# Patient Record
Sex: Female | Born: 1960
Health system: Southern US, Community
[De-identification: ages and names within clinical notes are randomized; demographics above are authoritative.]

## PROBLEM LIST (undated history)

## (undated) DIAGNOSIS — R011 Cardiac murmur, unspecified: Secondary | ICD-10-CM

## (undated) DIAGNOSIS — D219 Benign neoplasm of connective and other soft tissue, unspecified: Secondary | ICD-10-CM

## (undated) DIAGNOSIS — I341 Nonrheumatic mitral (valve) prolapse: Secondary | ICD-10-CM

## (undated) DIAGNOSIS — M8580 Other specified disorders of bone density and structure, unspecified site: Secondary | ICD-10-CM

## (undated) DIAGNOSIS — N809 Endometriosis, unspecified: Secondary | ICD-10-CM

## (undated) DIAGNOSIS — R51 Headache: Secondary | ICD-10-CM

## (undated) DIAGNOSIS — E785 Hyperlipidemia, unspecified: Secondary | ICD-10-CM

## (undated) HISTORY — DX: Benign neoplasm of connective and other soft tissue, unspecified: D21.9

## (undated) HISTORY — DX: Nonrheumatic mitral (valve) prolapse: I34.1

## (undated) HISTORY — PX: ROTATOR CUFF REPAIR: SHX139

## (undated) HISTORY — PX: GANGLION CYST EXCISION: SHX1691

## (undated) HISTORY — DX: Hyperlipidemia, unspecified: E78.5

## (undated) HISTORY — PX: CARPAL TUNNEL RELEASE: SHX101

## (undated) HISTORY — PX: TUBAL LIGATION: SHX77

## (undated) HISTORY — PX: OVARIAN CYST REMOVAL: SHX89

## (undated) HISTORY — PX: CHOLECYSTECTOMY: SHX55

## (undated) HISTORY — PX: VAGINAL HYSTERECTOMY: SUR661

## (undated) HISTORY — DX: Endometriosis, unspecified: N80.9

## (undated) HISTORY — DX: Cardiac murmur, unspecified: R01.1

---
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
## 1995-06-29 HISTORY — PX: OTHER SURGICAL HISTORY: SHX169

## 1997-08-14 ENCOUNTER — Ambulatory Visit (HOSPITAL_COMMUNITY): Admission: RE | Admit: 1997-08-14 | Discharge: 1997-08-14 | Payer: Self-pay | Admitting: Obstetrics and Gynecology

## 1999-05-04 ENCOUNTER — Inpatient Hospital Stay (HOSPITAL_COMMUNITY): Admission: RE | Admit: 1999-05-04 | Discharge: 1999-05-06 | Payer: Self-pay | Admitting: Obstetrics and Gynecology

## 1999-05-04 ENCOUNTER — Encounter (INDEPENDENT_AMBULATORY_CARE_PROVIDER_SITE_OTHER): Payer: Self-pay

## 1999-07-20 ENCOUNTER — Encounter: Payer: Self-pay | Admitting: Obstetrics and Gynecology

## 1999-07-20 ENCOUNTER — Ambulatory Visit (HOSPITAL_COMMUNITY): Admission: RE | Admit: 1999-07-20 | Discharge: 1999-07-20 | Payer: Self-pay | Admitting: Obstetrics and Gynecology

## 2000-04-14 ENCOUNTER — Other Ambulatory Visit: Admission: RE | Admit: 2000-04-14 | Discharge: 2000-04-14 | Payer: Self-pay | Admitting: Obstetrics and Gynecology

## 2001-01-17 ENCOUNTER — Encounter: Admission: RE | Admit: 2001-01-17 | Discharge: 2001-01-17 | Payer: Self-pay | Admitting: General Surgery

## 2001-01-17 ENCOUNTER — Encounter (HOSPITAL_BASED_OUTPATIENT_CLINIC_OR_DEPARTMENT_OTHER): Payer: Self-pay | Admitting: General Surgery

## 2001-08-29 ENCOUNTER — Other Ambulatory Visit: Admission: RE | Admit: 2001-08-29 | Discharge: 2001-08-29 | Payer: Self-pay | Admitting: Obstetrics and Gynecology

## 2001-09-04 ENCOUNTER — Encounter: Payer: Self-pay | Admitting: Obstetrics and Gynecology

## 2001-09-04 ENCOUNTER — Encounter: Admission: RE | Admit: 2001-09-04 | Discharge: 2001-09-04 | Payer: Self-pay | Admitting: Obstetrics and Gynecology

## 2002-01-22 ENCOUNTER — Encounter: Admission: RE | Admit: 2002-01-22 | Discharge: 2002-01-22 | Payer: Self-pay | Admitting: Internal Medicine

## 2002-01-22 ENCOUNTER — Encounter: Payer: Self-pay | Admitting: Internal Medicine

## 2003-01-18 ENCOUNTER — Encounter: Payer: Self-pay | Admitting: *Deleted

## 2003-01-18 ENCOUNTER — Emergency Department (HOSPITAL_COMMUNITY): Admission: EM | Admit: 2003-01-18 | Discharge: 2003-01-18 | Payer: Self-pay | Admitting: Emergency Medicine

## 2003-01-22 ENCOUNTER — Encounter: Payer: Self-pay | Admitting: Family Medicine

## 2003-01-22 ENCOUNTER — Encounter: Admission: RE | Admit: 2003-01-22 | Discharge: 2003-01-22 | Payer: Self-pay | Admitting: Family Medicine

## 2003-04-05 ENCOUNTER — Other Ambulatory Visit: Admission: RE | Admit: 2003-04-05 | Discharge: 2003-04-05 | Payer: Self-pay | Admitting: Obstetrics and Gynecology

## 2004-04-28 ENCOUNTER — Encounter: Admission: RE | Admit: 2004-04-28 | Discharge: 2004-04-28 | Payer: Self-pay | Admitting: Obstetrics and Gynecology

## 2004-05-05 ENCOUNTER — Other Ambulatory Visit: Admission: RE | Admit: 2004-05-05 | Discharge: 2004-05-05 | Payer: Self-pay | Admitting: Obstetrics and Gynecology

## 2004-05-05 ENCOUNTER — Encounter: Admission: RE | Admit: 2004-05-05 | Discharge: 2004-05-05 | Payer: Self-pay | Admitting: Obstetrics and Gynecology

## 2004-07-27 ENCOUNTER — Emergency Department (HOSPITAL_COMMUNITY): Admission: EM | Admit: 2004-07-27 | Discharge: 2004-07-27 | Payer: Self-pay | Admitting: Emergency Medicine

## 2005-03-15 ENCOUNTER — Ambulatory Visit: Payer: Self-pay | Admitting: Internal Medicine

## 2005-05-06 ENCOUNTER — Other Ambulatory Visit: Admission: RE | Admit: 2005-05-06 | Discharge: 2005-05-06 | Payer: Self-pay | Admitting: Obstetrics and Gynecology

## 2005-05-31 ENCOUNTER — Encounter: Admission: RE | Admit: 2005-05-31 | Discharge: 2005-05-31 | Payer: Self-pay | Admitting: Obstetrics and Gynecology

## 2006-04-07 ENCOUNTER — Ambulatory Visit: Payer: Self-pay | Admitting: Internal Medicine

## 2006-04-19 ENCOUNTER — Ambulatory Visit: Payer: Self-pay | Admitting: Family Medicine

## 2006-05-13 ENCOUNTER — Other Ambulatory Visit: Admission: RE | Admit: 2006-05-13 | Discharge: 2006-05-13 | Payer: Self-pay | Admitting: Obstetrics and Gynecology

## 2007-04-10 ENCOUNTER — Ambulatory Visit: Payer: Self-pay | Admitting: Internal Medicine

## 2007-04-10 DIAGNOSIS — E782 Mixed hyperlipidemia: Secondary | ICD-10-CM | POA: Insufficient documentation

## 2007-04-10 DIAGNOSIS — K219 Gastro-esophageal reflux disease without esophagitis: Secondary | ICD-10-CM | POA: Insufficient documentation

## 2007-04-16 LAB — CONVERTED CEMR LAB
ALT: 19 units/L (ref 0–35)
AST: 25 units/L (ref 0–37)
Albumin: 4.1 g/dL (ref 3.5–5.2)
Alkaline Phosphatase: 75 units/L (ref 39–117)
BUN: 15 mg/dL (ref 6–23)
Basophils Absolute: 0 10*3/uL (ref 0.0–0.1)
Basophils Relative: 0.5 % (ref 0.0–1.0)
Bilirubin, Direct: 0.1 mg/dL (ref 0.0–0.3)
CO2: 28 meq/L (ref 19–32)
Calcium: 9.4 mg/dL (ref 8.4–10.5)
Chloride: 107 meq/L (ref 96–112)
Cholesterol: 245 mg/dL (ref 0–200)
Creatinine, Ser: 0.7 mg/dL (ref 0.4–1.2)
Direct LDL: 172.5 mg/dL
Eosinophils Absolute: 0.1 10*3/uL (ref 0.0–0.6)
Eosinophils Relative: 2.5 % (ref 0.0–5.0)
GFR calc Af Amer: 115 mL/min
GFR calc non Af Amer: 95 mL/min
Glucose, Bld: 76 mg/dL (ref 70–99)
HCT: 38.6 % (ref 36.0–46.0)
HDL: 58.5 mg/dL (ref >39.0)
Hemoglobin: 13.3 g/dL (ref 12.0–15.0)
Lymphocytes Relative: 34 % (ref 12.0–46.0)
MCHC: 34.5 g/dL (ref 30.0–36.0)
MCV: 91.7 fL (ref 78.0–100.0)
Monocytes Absolute: 0.2 10*3/uL (ref 0.2–0.7)
Monocytes Relative: 3.7 % (ref 3.0–11.0)
Neutro Abs: 3 10*3/uL (ref 1.4–7.7)
Neutrophils Relative %: 59.3 % (ref 43.0–77.0)
Platelets: 206 10*3/uL (ref 150–400)
Potassium: 4 meq/L (ref 3.5–5.1)
RBC: 4.21 M/uL (ref 3.87–5.11)
RDW: 11.9 % (ref 11.5–14.6)
Sodium: 142 meq/L (ref 135–145)
TSH: 2.38 microintl units/mL (ref 0.35–5.50)
Total Bilirubin: 0.8 mg/dL (ref 0.3–1.2)
Total CHOL/HDL Ratio: 4.2
Total Protein: 7.5 g/dL (ref 6.0–8.3)
Triglycerides: 120 mg/dL (ref 0–149)
VLDL: 24 mg/dL (ref 0–40)
WBC: 5 10*3/uL (ref 4.5–10.5)

## 2007-04-17 ENCOUNTER — Encounter (INDEPENDENT_AMBULATORY_CARE_PROVIDER_SITE_OTHER): Payer: Self-pay | Admitting: *Deleted

## 2007-08-16 ENCOUNTER — Other Ambulatory Visit: Admission: RE | Admit: 2007-08-16 | Discharge: 2007-08-16 | Payer: Self-pay | Admitting: Gynecology

## 2008-01-19 ENCOUNTER — Encounter: Admission: RE | Admit: 2008-01-19 | Discharge: 2008-01-19 | Payer: Self-pay | Admitting: Orthopedic Surgery

## 2008-05-01 ENCOUNTER — Encounter: Payer: Self-pay | Admitting: Internal Medicine

## 2008-05-27 ENCOUNTER — Encounter: Payer: Self-pay | Admitting: Internal Medicine

## 2008-09-26 ENCOUNTER — Telehealth: Payer: Self-pay | Admitting: Internal Medicine

## 2008-09-26 ENCOUNTER — Ambulatory Visit (HOSPITAL_BASED_OUTPATIENT_CLINIC_OR_DEPARTMENT_OTHER): Admission: RE | Admit: 2008-09-26 | Discharge: 2008-09-26 | Payer: Self-pay | Admitting: Internal Medicine

## 2008-09-26 ENCOUNTER — Ambulatory Visit: Payer: Self-pay | Admitting: Diagnostic Radiology

## 2008-09-26 ENCOUNTER — Ambulatory Visit: Payer: Self-pay | Admitting: Internal Medicine

## 2008-09-26 ENCOUNTER — Encounter: Payer: Self-pay | Admitting: Internal Medicine

## 2008-09-26 LAB — CONVERTED CEMR LAB
BUN: 14 mg/dL (ref 6–23)
Basophils Absolute: 0 10*3/uL (ref 0.0–0.1)
Basophils Relative: 0 % (ref 0.0–3.0)
CO2: 27 meq/L (ref 19–32)
Calcium: 9 mg/dL (ref 8.4–10.5)
Chloride: 103 meq/L (ref 96–112)
Creatinine, Ser: 0.6 mg/dL (ref 0.4–1.2)
Eosinophils Absolute: 0 10*3/uL (ref 0.0–0.7)
Eosinophils Relative: 0.2 % (ref 0.0–5.0)
GFR calc non Af Amer: 112.53 mL/min (ref >60)
Glucose, Bld: 108 mg/dL — ABNORMAL HIGH (ref 70–99)
HCT: 37.4 % (ref 36.0–46.0)
Hemoglobin: 12.8 g/dL (ref 12.0–15.0)
Lymphocytes Relative: 11.1 % — ABNORMAL LOW (ref 12.0–46.0)
Lymphs Abs: 0.9 10*3/uL (ref 0.7–4.0)
MCHC: 34.2 g/dL (ref 30.0–36.0)
MCV: 92.3 fL (ref 78.0–100.0)
Monocytes Absolute: 0.2 10*3/uL (ref 0.1–1.0)
Monocytes Relative: 2.4 % — ABNORMAL LOW (ref 3.0–12.0)
Neutro Abs: 6.8 10*3/uL (ref 1.4–7.7)
Neutrophils Relative %: 86.3 % — ABNORMAL HIGH (ref 43.0–77.0)
Platelets: 196 10*3/uL (ref 150.0–400.0)
Potassium: 3.9 meq/L (ref 3.5–5.1)
Pro B Natriuretic peptide (BNP): 25 pg/mL (ref 0.0–100.0)
RBC: 4.05 M/uL (ref 3.87–5.11)
RDW: 12.1 % (ref 11.5–14.6)
Rapid Strep: NEGATIVE
Sodium: 137 meq/L (ref 135–145)
TSH: 1.1 microintl units/mL (ref 0.35–5.50)
WBC: 7.9 10*3/uL (ref 4.5–10.5)

## 2008-10-01 ENCOUNTER — Ambulatory Visit: Payer: Self-pay | Admitting: Internal Medicine

## 2008-10-04 ENCOUNTER — Encounter (INDEPENDENT_AMBULATORY_CARE_PROVIDER_SITE_OTHER): Payer: Self-pay | Admitting: *Deleted

## 2008-10-04 LAB — CONVERTED CEMR LAB
Basophils Absolute: 0.1 10*3/uL (ref 0.0–0.1)
Basophils Relative: 1.1 % (ref 0.0–3.0)
Eosinophils Absolute: 0.1 10*3/uL (ref 0.0–0.7)
Eosinophils Relative: 2.6 % (ref 0.0–5.0)
HCT: 36.4 % (ref 36.0–46.0)
Hemoglobin: 12.7 g/dL (ref 12.0–15.0)
Lymphocytes Relative: 37.9 % (ref 12.0–46.0)
Lymphs Abs: 2 10*3/uL (ref 0.7–4.0)
MCHC: 34.8 g/dL (ref 30.0–36.0)
MCV: 91.7 fL (ref 78.0–100.0)
Monocytes Absolute: 0.6 10*3/uL (ref 0.1–1.0)
Neutro Abs: 2.5 10*3/uL (ref 1.4–7.7)
Neutrophils Relative %: 47.5 % (ref 43.0–77.0)
RBC: 3.97 M/uL (ref 3.87–5.11)
WBC: 5.3 10*3/uL (ref 4.5–10.5)

## 2009-01-23 ENCOUNTER — Ambulatory Visit: Payer: Self-pay | Admitting: Internal Medicine

## 2009-01-23 LAB — CONVERTED CEMR LAB: HDL goal, serum: 40 mg/dL

## 2009-01-31 LAB — CONVERTED CEMR LAB
ALT: 23 units/L (ref 0–35)
AST: 25 units/L (ref 0–37)
Albumin: 4.2 g/dL (ref 3.5–5.2)
Alkaline Phosphatase: 107 units/L (ref 39–117)
BUN: 13 mg/dL (ref 6–23)
Basophils Relative: 0.2 % (ref 0.0–3.0)
Bilirubin, Direct: 0 mg/dL (ref 0.0–0.3)
CO2: 29 meq/L (ref 19–32)
Calcium: 9.3 mg/dL (ref 8.4–10.5)
Cholesterol: 221 mg/dL — ABNORMAL HIGH (ref 0–200)
Creatinine, Ser: 0.8 mg/dL (ref 0.4–1.2)
Direct LDL: 161 mg/dL
Eosinophils Relative: 2.5 % (ref 0.0–5.0)
GFR calc non Af Amer: 80.64 mL/min (ref >60)
HCT: 37.6 % (ref 36.0–46.0)
HDL: 43 mg/dL (ref >39.00)
Lymphocytes Relative: 36 % (ref 12.0–46.0)
MCHC: 34.3 g/dL (ref 30.0–36.0)
MCV: 90.8 fL (ref 78.0–100.0)
Monocytes Relative: 7.5 % (ref 3.0–12.0)
Neutro Abs: 2.6 10*3/uL (ref 1.4–7.7)
Neutrophils Relative %: 53.8 % (ref 43.0–77.0)
Platelets: 210 10*3/uL (ref 150.0–400.0)
Potassium: 4.5 meq/L (ref 3.5–5.1)
RBC: 4.14 M/uL (ref 3.87–5.11)
RDW: 12.3 % (ref 11.5–14.6)
Sodium: 141 meq/L (ref 135–145)
Total Bilirubin: 1 mg/dL (ref 0.3–1.2)
Total CHOL/HDL Ratio: 5
VLDL: 20.8 mg/dL (ref 0.0–40.0)
WBC: 4.9 10*3/uL (ref 4.5–10.5)

## 2009-02-04 ENCOUNTER — Encounter (INDEPENDENT_AMBULATORY_CARE_PROVIDER_SITE_OTHER): Payer: Self-pay | Admitting: *Deleted

## 2009-02-04 ENCOUNTER — Telehealth (INDEPENDENT_AMBULATORY_CARE_PROVIDER_SITE_OTHER): Payer: Self-pay | Admitting: *Deleted

## 2009-06-13 ENCOUNTER — Ambulatory Visit: Payer: Self-pay | Admitting: Family Medicine

## 2009-06-13 DIAGNOSIS — J019 Acute sinusitis, unspecified: Secondary | ICD-10-CM | POA: Insufficient documentation

## 2009-06-28 HISTORY — PX: COLONOSCOPY: SHX174

## 2009-07-21 ENCOUNTER — Encounter: Payer: Self-pay | Admitting: Internal Medicine

## 2009-09-18 ENCOUNTER — Other Ambulatory Visit: Admission: RE | Admit: 2009-09-18 | Discharge: 2009-09-18 | Payer: Self-pay | Admitting: Obstetrics and Gynecology

## 2009-09-18 ENCOUNTER — Ambulatory Visit: Payer: Self-pay | Admitting: Obstetrics and Gynecology

## 2009-09-23 ENCOUNTER — Encounter: Payer: Self-pay | Admitting: Internal Medicine

## 2009-11-04 ENCOUNTER — Encounter: Payer: Self-pay | Admitting: Internal Medicine

## 2010-05-12 ENCOUNTER — Encounter: Payer: Self-pay | Admitting: Internal Medicine

## 2010-05-12 LAB — HM COLONOSCOPY

## 2010-07-19 ENCOUNTER — Encounter: Payer: Self-pay | Admitting: Internal Medicine

## 2010-07-30 NOTE — Consult Note (Signed)
Summary: [facility]  [facility]   Imported By: Lanelle Bal 11/10/2009 11:53:13  _____________________________________________________________________  External Attachment:    Type:   Image     Comment:   External Document

## 2010-07-30 NOTE — Letter (Signed)
Summary: [facility]  [facility]   Imported By: Lanelle Bal 09/30/2009 09:36:11  _____________________________________________________________________  External Attachment:    Type:   Image     Comment:   External Document

## 2010-07-30 NOTE — Letter (Signed)
Summary: [facility]  [facility]   Imported By: Lanelle Bal 07/28/2009 11:27:31  _____________________________________________________________________  External Attachment:    Type:   Image     Comment:   External Document

## 2010-07-30 NOTE — Procedures (Signed)
Summary: Colonoscopy/[facility]  Colonoscopy/[facility]   Imported By: Lanelle Bal 05/29/2010 11:38:52  _____________________________________________________________________  External Attachment:    Type:   Image     Comment:   External Document

## 2010-09-15 ENCOUNTER — Telehealth: Payer: Self-pay | Admitting: *Deleted

## 2010-09-15 DIAGNOSIS — R110 Nausea: Secondary | ICD-10-CM

## 2010-09-15 MED ORDER — PROMETHAZINE HCL 25 MG RE SUPP: 25.0000 mg | Freq: Four times a day (QID) | RECTAL | Status: AC | PRN

## 2010-09-15 NOTE — Telephone Encounter (Signed)
Spoke w/ pt informed rx sent to pharmacy for nausea and will call w/ any information.

## 2010-09-15 NOTE — Telephone Encounter (Signed)
Phenergan suppsitories s 25 mg # 5 ; every 6 hrs prn

## 2010-09-18 ENCOUNTER — Telehealth: Payer: Self-pay | Admitting: Internal Medicine

## 2010-09-18 NOTE — Telephone Encounter (Signed)
Spoke w/ pt says that she is feeling better instructed to increase fluids as tolerated and has started to eat solid as tolerated.

## 2010-09-18 NOTE — Telephone Encounter (Signed)
Med was called in [id number] - direhhea & vomiting stopped but patient is weak - wants to know if she needs to be seen

## 2011-10-04 ENCOUNTER — Encounter: Payer: Self-pay | Admitting: Gynecology

## 2011-10-04 DIAGNOSIS — N800 Endometriosis of uterus: Secondary | ICD-10-CM | POA: Insufficient documentation

## 2011-10-04 DIAGNOSIS — I341 Nonrheumatic mitral (valve) prolapse: Secondary | ICD-10-CM | POA: Insufficient documentation

## 2011-10-04 DIAGNOSIS — D219 Benign neoplasm of connective and other soft tissue, unspecified: Secondary | ICD-10-CM | POA: Insufficient documentation

## 2011-10-05 ENCOUNTER — Ambulatory Visit (INDEPENDENT_AMBULATORY_CARE_PROVIDER_SITE_OTHER): Payer: BC Managed Care – PPO | Admitting: Obstetrics and Gynecology

## 2011-10-05 ENCOUNTER — Encounter: Payer: Self-pay | Admitting: Obstetrics and Gynecology

## 2011-10-05 VITALS — BP 110/60 | Ht 70.0 in | Wt 194.0 lb

## 2011-10-05 DIAGNOSIS — Z01419 Encounter for gynecological examination (general) (routine) without abnormal findings: Secondary | ICD-10-CM

## 2011-10-05 DIAGNOSIS — R1031 Right lower quadrant pain: Secondary | ICD-10-CM

## 2011-10-05 NOTE — Progress Notes (Signed)
Patient came to see me today for her annual GYN exam. She has had her mammogram this year which was normal. She is not having menopausal symptoms. She does her lab work from her PCP. Since February she has had right lower quadrant pain. It will come and go. It started when she lifted a suitcase in February. Recently she's had almost constant pain on the right side. She will sometimes see a bulge. It feels swollen. It can be associated with nausea. She is having no vomiting. She is having no vaginal bleeding.  HEENT: Within normal limits. Kennon Portela present. Neck: No masses. Supraclavicular lymph nodes: Not enlarged. Breasts: Examined in both sitting and lying position. Symmetrical without skin changes or masses. Abdomen: Soft no masses guarding or rebound. When the patient stands and does a Valsalva maneuver there is a clear weakness on the right but I don't really feel a bulge. Pelvic: External within normal limits. BUS within normal limits. Vaginal examination shows good estrogen effect, no cystocele enterocele or rectocele. Cervix and uterus absent. Adnexa within normal limits. Rectovaginal confirmatory. Extremities within normal limits.  Assessment: Right lower quadrant pain with inguinal hernia suspected.  Plan: pelvic ultrasound to rule out ovarian pathology. Assuming is normal referred to Dr. Daphine Deutscher for assessment.

## 2011-10-06 LAB — URINALYSIS W MICROSCOPIC + REFLEX CULTURE
Bacteria, UA: NONE SEEN
Bilirubin Urine: NEGATIVE
Casts: NONE SEEN
Crystals: NONE SEEN
Glucose, UA: NEGATIVE mg/dL
Hgb urine dipstick: NEGATIVE
Ketones, ur: NEGATIVE mg/dL
Leukocytes, UA: NEGATIVE
Nitrite: NEGATIVE
Protein, ur: NEGATIVE mg/dL
Specific Gravity, Urine: 1.022 (ref 1.005–1.030)
Squamous Epithelial / HPF: NONE SEEN
Urobilinogen, UA: 0.2 mg/dL (ref 0.0–1.0)
pH: 6 (ref 5.0–8.0)

## 2011-10-13 ENCOUNTER — Other Ambulatory Visit: Payer: Self-pay

## 2011-10-13 ENCOUNTER — Ambulatory Visit: Payer: Self-pay | Admitting: Obstetrics and Gynecology

## 2011-10-29 ENCOUNTER — Encounter (INDEPENDENT_AMBULATORY_CARE_PROVIDER_SITE_OTHER): Payer: BC Managed Care – PPO | Admitting: Surgery

## 2011-11-11 ENCOUNTER — Ambulatory Visit (INDEPENDENT_AMBULATORY_CARE_PROVIDER_SITE_OTHER): Payer: BC Managed Care – PPO | Admitting: Surgery

## 2011-11-11 ENCOUNTER — Other Ambulatory Visit (INDEPENDENT_AMBULATORY_CARE_PROVIDER_SITE_OTHER): Payer: Self-pay | Admitting: General Surgery

## 2011-11-11 ENCOUNTER — Encounter (INDEPENDENT_AMBULATORY_CARE_PROVIDER_SITE_OTHER): Payer: Self-pay | Admitting: Surgery

## 2011-11-11 VITALS — BP 120/82 | HR 64 | Temp 98.6°F | Resp 14 | Ht 70.5 in | Wt 196.0 lb

## 2011-11-11 DIAGNOSIS — R1031 Right lower quadrant pain: Secondary | ICD-10-CM

## 2011-11-11 DIAGNOSIS — R1030 Lower abdominal pain, unspecified: Secondary | ICD-10-CM | POA: Insufficient documentation

## 2011-11-11 NOTE — Progress Notes (Signed)
Dominique Lane was pulling a suitcase back in February and felt some tearing in her lower right side. Since then she has had pain which he describes at times burning and at times is sharp in the pelvic region. She was scheduled to see Dr. Eda Paschal but delayed seeing him into she saw me first to look at evidence of hernia. She denies any history of obvious bulge. To my physical exam today there may be a a small impulse bulge with cough and Valsalva.  Overall she is also concerned about her pelvis and a CT scan could possibly demonstrate a hernia but also rule out any underlying pathology. As the next we will check CT abdomen and pelvis with oral contrast. I will see her back after that

## 2011-11-16 ENCOUNTER — Ambulatory Visit
Admission: RE | Admit: 2011-11-16 | Discharge: 2011-11-16 | Disposition: A | Discharge status: Home / Self Care | Payer: BC Managed Care – PPO | Source: Ambulatory Visit | Attending: Surgery | Admitting: Surgery

## 2011-11-16 DIAGNOSIS — R1031 Right lower quadrant pain: Secondary | ICD-10-CM

## 2011-11-16 MED ORDER — IOHEXOL 300 MG/ML  SOLN
125.0000 mL | Freq: Once | INTRAMUSCULAR | Status: AC | PRN
  Administered 2011-11-16: 125 mL via INTRAVENOUS

## 2011-11-24 ENCOUNTER — Encounter (INDEPENDENT_AMBULATORY_CARE_PROVIDER_SITE_OTHER): Payer: BC Managed Care – PPO | Admitting: Surgery

## 2011-11-25 ENCOUNTER — Encounter (INDEPENDENT_AMBULATORY_CARE_PROVIDER_SITE_OTHER): Payer: Self-pay | Admitting: Surgery

## 2011-11-25 ENCOUNTER — Ambulatory Visit (INDEPENDENT_AMBULATORY_CARE_PROVIDER_SITE_OTHER): Payer: BC Managed Care – PPO | Admitting: Surgery

## 2011-11-25 VITALS — BP 124/70 | HR 72 | Temp 97.4°F | Resp 16 | Ht 70.0 in | Wt 196.1 lb

## 2011-11-25 DIAGNOSIS — R1031 Right lower quadrant pain: Secondary | ICD-10-CM

## 2011-11-25 NOTE — Progress Notes (Signed)
Ms. Shellhammer's CT was normal. No evidence of hernia. No comments made about any abnormalities. She does have some fatty infiltration of liver.  She has noted since her CT scan when she urinates heavily she notices that her pain goes away. One point she described possible passage of a kidney stone. This was not seen on the CT scan. I think that it would be deferred to back and see Dr. Alwyn Ren in followup. There is nothing that I would recommend surgically for at this time. She'll O. frustrated that we had found anything although no news  can be good news.

## 2012-01-10 ENCOUNTER — Ambulatory Visit: Payer: BC Managed Care – PPO | Admitting: Family Medicine

## 2012-01-10 VITALS — BP 110/70 | HR 63 | Temp 97.9°F | Resp 16 | Ht 69.5 in | Wt 194.2 lb

## 2012-01-10 DIAGNOSIS — H579 Unspecified disorder of eye and adnexa: Secondary | ICD-10-CM

## 2012-01-10 DIAGNOSIS — H5789 Other specified disorders of eye and adnexa: Secondary | ICD-10-CM

## 2012-01-10 DIAGNOSIS — R21 Rash and other nonspecific skin eruption: Secondary | ICD-10-CM

## 2012-01-10 MED ORDER — METHYLPREDNISOLONE 4 MG PO KIT: PACK | ORAL | Status: AC

## 2012-01-10 MED ORDER — DOXYCYCLINE HYCLATE 100 MG PO TABS: 100.0000 mg | ORAL_TABLET | Freq: Two times a day (BID) | ORAL | Status: AC

## 2012-01-10 MED ORDER — METHYLPREDNISOLONE SODIUM SUCC 125 MG IJ SOLR
125.0000 mg | Freq: Once | INTRAMUSCULAR | Status: AC
  Administered 2012-01-10: 125 mg via INTRAMUSCULAR

## 2012-01-10 NOTE — Progress Notes (Signed)
[facility]:  Office Visit  Chief Complaint:  Chief Complaint  Patient presents with  . Rash    on face - spreading and itches  x 3 days    HPI: Dominique Lane is a 51 y.o. female who complains of 3 day h/o rash on face, started off as a red small itcy pachy on left eyebrow area then spread. Work in medical lab, making osteotomy bags. ? Bug exposure. No new meds, detergents, foods. ? posion oak/ivy exposure.  Denies burning, pain. +minimal itching  Past Medical History  Diagnosis Date  . Fibroid   . Adenomyosis   . MVP (mitral valve prolapse)     Antibiotics required  . Heart murmur   . Hyperlipidemia    Past Surgical History  Procedure Date  . Cholecystectomy   . Ovarian cyst removal     Right  . Tubal ligation   . Ganglion cyst excision   . Rotator cuff repair   . Vaginal hysterectomy    History   Social History  . Marital Status: Single    Spouse Name: N/A    Number of Children: N/A  . Years of Education: N/A   Social History Main Topics  . Smoking status: Never Smoker   . Smokeless tobacco: None  . Alcohol Use: 0.5 oz/week    1 drink(s) per week  . Drug Use: No  . Sexually Active: Yes    Birth Control/ Protection: Surgical   Other Topics Concern  . None   Social History Narrative  . None   Family History  Problem Relation Age of Onset  . Heart disease Mother   . Hypertension Sister   . Heart disease Sister    Allergies  Allergen Reactions  . Naproxen Sodium Anaphylaxis  . Cephalosporins Hives    All over the body.   Prior to Admission medications   Medication Sig Start Date End Date Taking? Authorizing Provider  aspirin 81 MG tablet Take 81 mg by mouth daily.   Yes Historical Provider, MD  fish oil-omega-3 fatty acids 1000 MG capsule Take 1 g by mouth daily.   Yes Historical Provider, MD  Multiple Vitamin (MULTIVITAMIN) tablet Take 1 tablet by mouth daily.   Yes Historical Provider, MD     ROS: The patient denies  fevers, chills, night sweats, unintentional weight loss, chest pain, palpitations, wheezing, dyspnea on exertion, nausea, vomiting, abdominal pain, dysuria, hematuria, melena, numbness, weakness, or tingling.  All other systems have been reviewed and were otherwise negative with the exception of those mentioned in the HPI and as above.    PHYSICAL EXAM: Filed Vitals:   01/10/12 1722  BP: 110/70  Pulse: 63  Temp: 97.9 F (36.6 C)  Resp: 16   Filed Vitals:   01/10/12 1722  Height: 5' 9.5" (1.765 m)  Weight: 194 lb 3.2 oz (88.089 kg)   Body mass index is 28.27 kg/(m^2).  General: Alert, no acute distress HEENT:  Normocephalic, atraumatic, oropharynx patent. PERRLA, EOMI, fundoscopic exam nl Cardiovascular:  Regular rate and rhythm, no rubs murmurs or gallops.  No Carotid bruits, radial pulse intact. No pedal edema.  Respiratory: Clear to auscultation bilaterally.  No wheezes, rales, or rhonchi.  No cyanosis, no use of accessory musculature GI: No organomegaly, abdomen is soft and non-tender, positive bowel sounds.  No masses. Skin:  + facial rash. Impetigo like crusting with diffuse erythematous macular patches bilaterally on face, left eye lid red patch on medial aspect Neurologic: Facial musculature  symmetric. Psychiatric: Patient is appropriate throughout our interaction. Lymphatic: No cervical lymphadenopathy Musculoskeletal: Gait intact.   LABS: Results for orders placed in visit on 10/05/11  URINALYSIS WITH CULTURE REFLEX      Component Value Range   Color, Urine YELLOW  YELLOW   APPearance CLEAR  CLEAR   Specific Gravity, Urine 1.022  1.005 - 1.030   pH 6.0  5.0 - 8.0   Glucose, UA NEG  NEG mg/dL   Bilirubin Urine NEG  NEG   Ketones, ur NEG  NEG mg/dL   Hgb urine dipstick NEG  NEG   Protein, ur NEG  NEG mg/dL   Urobilinogen, UA 0.2  0.0 - 1.0 mg/dL   Nitrite NEG  NEG   Leukocytes, UA NEG  NEG   Squamous Epithelial / LPF NONE SEEN  RARE   Crystals NONE SEEN  NONE  SEEN   Casts NONE SEEN  NONE SEEN   WBC, UA 0-2  <3 WBC/hpf   RBC / HPF 0-2  <3 RBC/hpf   Bacteria, UA NONE SEEN  RARE     EKG/XRAY:   Primary read interpreted by Dr. Conley Rolls at [facility].   ASSESSMENT/PLAN: Encounter Diagnoses  Name Primary?  . Rash and nonspecific skin eruption Yes  . Inflammation of eye, left    ? Poison oak vs Impetigo or combination of both Medrol dose pack, Doxycycline BID F/u in 48 hrs otherwise prn if worse.     LE, THAO PHUONG, DO 01/10/2012 6:10 PM

## 2012-03-03 ENCOUNTER — Ambulatory Visit (INDEPENDENT_AMBULATORY_CARE_PROVIDER_SITE_OTHER): Payer: BC Managed Care – PPO

## 2012-03-03 ENCOUNTER — Ambulatory Visit (INDEPENDENT_AMBULATORY_CARE_PROVIDER_SITE_OTHER): Payer: BC Managed Care – PPO | Admitting: Obstetrics and Gynecology

## 2012-03-03 DIAGNOSIS — R1031 Right lower quadrant pain: Secondary | ICD-10-CM

## 2012-03-03 NOTE — Progress Notes (Signed)
Patient came to see me today with followup of right lower quadrant pain. It started in February of this year. She will have a 2-3 times a week. it can be some sharp that it wakes her up.  When we last saw her we suggested ultrasound and if normal surgical referral. Instead she elected to go see Dr. Wenda Low  who wasn't sure what it was and  was not convinced it was a hernia. He sent her for a CT scan of her abdomen and pelvis which did not reveal a source of the pain. She is having no vaginal bleeding. She has no urinary symptoms. She has no change in her bowel habits. She feels a swelling in that area. She came back today because the pain has persisted for an ultrasound.  On ultrasound her uterus is surgically absent. Both ovaries were normal. Her cul-de-sac is free of fluid.  Assessment: Right lower quadrant pain with swelling of unknown etiology.  Plan: We will first have her see Dorena Cookey who is her gastroenterologist. If he cannot help she will call me and I will discuss with Dr. Daphine Deutscher laparoscopy looking for endometriosis or pelvic adhesive disease or an occult  Hernia. When she had her vaginal hysterectomy in 2000 she had adenomyosis, fibroids and a cyst of her right ovary which was excised. I told her I thought was unlikely that we would find endometriosis or other problems but not impossible. In reviewing her operative note there were several areas on her left ovary which were pigmented and were cauterized.when we cauterized them no endometriotic fluid drained from them. I also examined her today and really couldn't appreciate either a hernia or swelling. We also discussed a urological referral for interstitial cystitis but I asked  the patient to  call me after she sees Dr. Madilyn Fireman.

## 2012-03-03 NOTE — Patient Instructions (Signed)
Please call me after you see Dr. Madilyn Fireman.

## 2012-03-14 ENCOUNTER — Encounter: Payer: Self-pay | Admitting: Obstetrics and Gynecology

## 2012-03-14 ENCOUNTER — Ambulatory Visit (INDEPENDENT_AMBULATORY_CARE_PROVIDER_SITE_OTHER): Payer: BC Managed Care – PPO | Admitting: Obstetrics and Gynecology

## 2012-03-14 DIAGNOSIS — R1031 Right lower quadrant pain: Secondary | ICD-10-CM

## 2012-03-14 NOTE — Progress Notes (Signed)
The patient came to see me today for further followup of her right lower quadrant pain. Please also see previous notes. This pain has bothered her off and on since April. It started after heavy lifting and was aggravated when she used the  Lawnmower. She had previously had both an ultrasound and a CT scan which did not reveal ovarian pathology or at inguinal hernia. She is also seeing Dr. Daphine Deutscher in April, 2013. Since I last seen her she is seeing Dr. Madilyn Fireman who did not feel there was a digestive problem causing the pain. He will send me a note. She has had several episodes recently of severe right lower quadrant pain. They've been associated with nausea.  We discussed the above in great detail. Our plan will be to have her see Dr. Logan Bores for assessment for interstitial cystitis. After that she will either consider surgery or physical therapy for myofascial pain. I told her I thought Dr. Daphine Deutscher would  want to see her prior to surgery if we were going to operate  on her together.

## 2012-03-14 NOTE — Patient Instructions (Signed)
Make appointment to see Dr. Logan Bores.

## 2012-04-05 DIAGNOSIS — R1031 Right lower quadrant pain: Secondary | ICD-10-CM | POA: Insufficient documentation

## 2012-04-07 ENCOUNTER — Institutional Professional Consult (permissible substitution): Payer: BC Managed Care – PPO | Admitting: Obstetrics and Gynecology

## 2012-04-10 ENCOUNTER — Ambulatory Visit (INDEPENDENT_AMBULATORY_CARE_PROVIDER_SITE_OTHER): Payer: BC Managed Care – PPO | Admitting: Internal Medicine

## 2012-04-10 ENCOUNTER — Encounter: Payer: Self-pay | Admitting: Internal Medicine

## 2012-04-10 VITALS — BP 140/82 | HR 90 | Temp 98.6°F | Wt 194.4 lb

## 2012-04-10 DIAGNOSIS — I341 Nonrheumatic mitral (valve) prolapse: Secondary | ICD-10-CM

## 2012-04-10 DIAGNOSIS — Z23 Encounter for immunization: Secondary | ICD-10-CM

## 2012-04-10 DIAGNOSIS — I059 Rheumatic mitral valve disease, unspecified: Secondary | ICD-10-CM

## 2012-04-10 DIAGNOSIS — R109 Unspecified abdominal pain: Secondary | ICD-10-CM

## 2012-04-10 DIAGNOSIS — R1031 Right lower quadrant pain: Secondary | ICD-10-CM

## 2012-04-10 LAB — CBC WITH DIFFERENTIAL/PLATELET
Basophils Absolute: 0 10*3/uL (ref 0.0–0.1)
Eosinophils Absolute: 0 10*3/uL (ref 0.0–0.7)
HCT: 41 % (ref 36.0–46.0)
Hemoglobin: 13.6 g/dL (ref 12.0–15.0)
Lymphocytes Relative: 25.2 % (ref 12.0–46.0)
Lymphs Abs: 2 10*3/uL (ref 0.7–4.0)
MCHC: 33.3 g/dL (ref 30.0–36.0)
Monocytes Absolute: 0.6 10*3/uL (ref 0.1–1.0)
Monocytes Relative: 7.2 % (ref 3.0–12.0)
Neutro Abs: 5.5 10*3/uL (ref 1.4–7.7)
Platelets: 297 10*3/uL (ref 150.0–400.0)
RDW: 13 % (ref 11.5–14.6)
WBC: 8.1 10*3/uL (ref 4.5–10.5)

## 2012-04-10 LAB — LIPASE: Lipase: 29 U/L (ref 11.0–59.0)

## 2012-04-10 LAB — HEPATIC FUNCTION PANEL
AST: 19 U/L (ref 0–37)
Alkaline Phosphatase: 95 U/L (ref 39–117)
Total Bilirubin: 0.8 mg/dL (ref 0.3–1.2)

## 2012-04-10 LAB — AMYLASE: Amylase: 45 U/L (ref 27–131)

## 2012-04-10 MED ORDER — RANITIDINE HCL 150 MG PO TABS: 150.0000 mg | ORAL_TABLET | Freq: Two times a day (BID) | ORAL | Status: DC

## 2012-04-10 MED ORDER — GABAPENTIN 100 MG PO CAPS: ORAL_CAPSULE | ORAL | Status: DC

## 2012-04-10 NOTE — Patient Instructions (Addendum)
Please complete stool cards. The triggers for reflux  include stress; the "aspirin family" ; alcohol; peppermint; and caffeine (coffee, tea, cola, and chocolate). The aspirin family would include aspirin and the nonsteroidal agents such as ibuprofen &  Naproxen. Tylenol would not cause reflux. If having symptoms ; food & drink should be avoided for @ least 2 hours before going to bed.  If you activate My Chart; the results can be released to you as soon as they populate from the lab. If you choose not to use this program; the labs have to be reviewed, copied & mailed   causing a delay in getting the results to you.

## 2012-04-10 NOTE — Progress Notes (Signed)
  Subjective:    Patient ID: Dominique Lane, female    DOB: 01-23-1961, 51 y.o.   MRN: [id number]  HPI She has 2 separate pain pictures. #1 after lifting something out of her car in February she experienced right lower quadrant pain which has been present intermittently since. This is described as burning and stinging and worse with sitting or lifting. She feels a "bulge" in this area at times. CAT scan of the abdomen/pelvis by Dr Daphine Deutscher , Gen Surgeon and  ultrasound performed by her gynecologist were negative or nondiagnostic. Last week she received a lidocaine injection in this area from Dr Logan Bores, Urologist. She had subsequent numbness in the right thigh for several days. He Rxed Gabapentin , but she  has not taken this  #2 over the last month she's had intermittent right upper quadrant sharp pain associated with nausea. She cannot define any specific triggers or exacerbating factors for this. She has not treated this with any intervention.  Past medical history/family history/social history were all reviewed and updated. Pertinent data: endometriosis, cholecystectomy    Review of Systems  Constitutional: no fever, chills, sweats, change in weight  Musculoskeletal:no  muscle cramps or pain; no  joint stiffness, redness, or swelling Skin:no rash, color/temp change Neuro: no weakness; incontinence (stool/urine); numbness and tingling only post injection Heme:no lymphadenopathy; abnormal bruising or bleeding  GI: no dysphagia, melena, rectal bleeding GU:no hematuria, pyuria, or dysuria       Objective:   Physical Exam General appearance is one of good health and nourishment w/o distress.  Eyes: No conjunctival inflammation or scleral icterus is present.  Oral exam: Dental hygiene is good; lips and gums are healthy appearing.There is no oropharyngeal erythema or exudate noted.   Heart:  Normal rate and regular rhythm. S1 and S2 normal without gallop, murmur, click, rub or other extra  sounds     Lungs:Chest clear to auscultation; no wheezes, rhonchi,rales ,or rubs present.No increased work of breathing.   Abdomen: bowel sounds normal, soft and non-tender without masses, organomegaly or hernias noted.  No guarding or rebound   Skin:Warm & dry.  Intact without suspicious lesions or rashes ; no jaundice or tenting  Lymphatic: No lymphadenopathy is noted about the head, neck, axilla, or inguinal areas.   Musculoskeletal: No spinal changes noted. She is able to lie flat and sit up without help. Straight leg raising is negative to 90 bilaterally.  Neuro: Gait including heel and toe walking were normal. Deep tendon reflexes, strength and tone were normal             Assessment & Plan:

## 2012-04-10 NOTE — Addendum Note (Signed)
Addended by: Maurice Small on: 04/10/2012 06:10 PM   Modules accepted: Orders

## 2012-06-06 ENCOUNTER — Telehealth: Payer: Self-pay | Admitting: *Deleted

## 2012-06-06 ENCOUNTER — Ambulatory Visit (INDEPENDENT_AMBULATORY_CARE_PROVIDER_SITE_OTHER): Payer: BC Managed Care – PPO | Admitting: Obstetrics and Gynecology

## 2012-06-06 DIAGNOSIS — R1031 Right lower quadrant pain: Secondary | ICD-10-CM

## 2012-06-06 NOTE — Progress Notes (Signed)
The patient came back today to discuss with me her right lower quadrant pain. She has previously had a negative ultrasound and CT scan. She saw   Dorena Cookey who did not think she had a gastrointestinal problem. We discussed the possibility of endometriosis. We sent her to see Dr. Logan Bores who injected  her in late October and since he did that her pain is much better and no longer radiates to her leg. She had seen Dr. Wenda Low who was skeptical that she had a inguinal hernia although her exam was somewhat suggestive. What has happened since then is that her sister who is are patientBrent Bulla) was diagnosed with stage III ovarian cancer after a negative pelvic ultrasound and a CT scan showing normal ovaries but abnormal omentum. The patient is concerned that she may also have ovarian cancer in spite of her negative ultrasound and CT scan. The other issue is whether she should have preventative laparoscopic BSO.  We had a 30 minute discussion of the above. We will send her to Dr. Kerri Perches who did her sister's  surgery for his opinion. I told her emotionally I favored  laparoscopy with bilateral salpingo-oophorectomy. I discussed the case with Dr. Reynaldo Minium who met  the patient. We will get her records from Dr. Logan Bores and see what Dr. Argentina Ponder thinks.

## 2012-06-06 NOTE — Telephone Encounter (Signed)
Spoke with Efraim Kaufmann at cancer center and nancy Jearl Klinefelter will forward information to Dr.Pierson and will call office back to let us know time and date if pt will be seen here in locally of in [place].

## 2012-06-06 NOTE — Telephone Encounter (Signed)
Message copied by Aura Camps on Tue Jun 06, 2012  2:31 PM ------      Message from: Trellis Paganini      Created: Tue Jun 06, 2012 10:55 AM       Please schedule patient for her appointment with Dr. Argentina Ponder. She is having chronic pelvic pain. She has had a negative ultrasound and CT scan. Her sister is [name] who Dr. Argentina Ponder just operated on for stage III ovarian cancer. She will see him  either here or in [facility] wherever he will see her first.

## 2012-06-06 NOTE — Patient Instructions (Signed)
Victorino Dike in my office we'll schedule your appointment with Dr. Marvia Pickles -Chaney Malling.

## 2012-06-08 ENCOUNTER — Telehealth: Payer: Self-pay | Admitting: Obstetrics and Gynecology

## 2012-06-08 NOTE — Telephone Encounter (Signed)
The test should be done on the patient who has ovarian cancer. Insurances much more likely to pay for [facility] then for her. However if Dominique Lane is positive it usually we'll pay for her.

## 2012-06-08 NOTE — Telephone Encounter (Signed)
Dominique Lane has spoke with dr. Georgann Housekeeper about appt and it will be taking care of [facility].

## 2012-06-08 NOTE — Telephone Encounter (Signed)
Patient informed and she understood.    In the meantime, upon looking at this letter Dr. Reece Agar brought me from Dr. Kela Millin, I see note from Telford Nab on it where she had called [name] and left message to call and was going to arrange the Genetic counseling consult. I called [name] and she said Telford Nab had indeed called her and had made arrangements and told her genetic counselor would be calling her shortly to schedule appt.  Bonita Quin asked me about costs and checking with ins. And I told her that they will take care of that for her when she goes for her appointment.

## 2012-06-08 NOTE — Telephone Encounter (Signed)
Bellarae questioning why test is being ordered on [place] and not her.  She is concerned regarding costs and thinks ins might be more likely to pay for her to have it?  She doesn't want Bonita Quin to incur any costs.

## 2012-06-20 ENCOUNTER — Institutional Professional Consult (permissible substitution): Payer: BC Managed Care – PPO | Admitting: Obstetrics and Gynecology

## 2012-06-26 ENCOUNTER — Institutional Professional Consult (permissible substitution): Payer: BC Managed Care – PPO | Admitting: Obstetrics and Gynecology

## 2012-06-29 ENCOUNTER — Telehealth: Payer: Self-pay | Admitting: Gynecology

## 2012-06-29 NOTE — Telephone Encounter (Signed)
Patient calls today stating that we were to be scheduling an appointment with Dr. Stanford Breed for her and she has not heard anything from [place] regarding that.  She did remind me that Dr. Valetta Fuller office had recommended to Dr. Reece Agar that Dr. C-P would like her sister, [name], who had Ovarian Cancer and had surgery with him to have BRACA gene testing and that would help in deciding how to proceed with Kiaja's case.  Klaira said that the [facility] testing for her sister is not scheduled until February and she is not going to have it if insurance does not cover it.  She asked me to inquire about her appointment.

## 2012-06-30 NOTE — Telephone Encounter (Signed)
I faxed note over to GYN onc office yesterday inquiring about what Dr. Asa Saunas recommended for this patient.  Of note, Patient said that she found out over the weekend that her familiy history of cancer is more extensive than she thought with her Great GM having had breast cancer, an aunt with lymphoma who passed away, and several cousins of her mat aunt with cancer and she believes possibly ovarian cancer.

## 2012-06-30 NOTE — Telephone Encounter (Signed)
Dominique Lane called from GYN-Onc.  Dominique Lane said that Dr. Valetta Fuller recommendation was that patient's sister have BRACA testing.Dominique Lane said that  he did not feel an appointment at this point would be beneficial to patient since no cancer diagnosis and ovaries have appeared normal on exams.  Dominique Lane felt February reasonable for [name] testing for sister since patient does not have cancer diagnosis.  Patient informed of this.

## 2012-08-04 ENCOUNTER — Ambulatory Visit: Payer: BC Managed Care – PPO | Admitting: Gynecology

## 2012-08-10 ENCOUNTER — Ambulatory Visit: Payer: BC Managed Care – PPO | Admitting: Gynecology

## 2012-08-12 ENCOUNTER — Other Ambulatory Visit: Payer: Self-pay

## 2012-08-15 ENCOUNTER — Ambulatory Visit: Payer: BC Managed Care – PPO | Admitting: Gynecology

## 2012-08-23 ENCOUNTER — Encounter: Payer: Self-pay | Admitting: Gynecology

## 2012-08-23 ENCOUNTER — Ambulatory Visit (INDEPENDENT_AMBULATORY_CARE_PROVIDER_SITE_OTHER): Payer: BC Managed Care – PPO | Admitting: Gynecology

## 2012-08-23 ENCOUNTER — Other Ambulatory Visit: Payer: Self-pay | Admitting: Gynecology

## 2012-08-23 VITALS — BP 132/88

## 2012-08-23 DIAGNOSIS — N809 Endometriosis, unspecified: Secondary | ICD-10-CM

## 2012-08-23 DIAGNOSIS — R102 Pelvic and perineal pain: Secondary | ICD-10-CM

## 2012-08-23 DIAGNOSIS — Z8041 Family history of malignant neoplasm of ovary: Secondary | ICD-10-CM

## 2012-08-23 DIAGNOSIS — N949 Unspecified condition associated with female genital organs and menstrual cycle: Secondary | ICD-10-CM

## 2012-08-23 LAB — URINALYSIS W MICROSCOPIC + REFLEX CULTURE
Casts: NONE SEEN
Crystals: NONE SEEN
Glucose, UA: NEGATIVE mg/dL
Leukocytes, UA: NEGATIVE
Specific Gravity, Urine: >1.03 — ABNORMAL HIGH (ref 1.005–1.030)
WBC, UA: NONE SEEN WBC/hpf (ref <3)
pH: 5.5 (ref 5.0–8.0)

## 2012-08-23 NOTE — Progress Notes (Signed)
Patient is a 52 year old who is been followed by my partner Dr. Eda Paschal for her chronic right lower quadrant pain. The following is a copy of his last note from office visit December 2013:  "She has previously had a negative ultrasound and CT scan. She saw Dorena Cookey who did not think she had a gastrointestinal problem. We discussed the possibility of endometriosis. We sent her to see Dr. Logan Bores who injected her in late October and since he did that her pain is much better and no longer radiates to her leg. She had seen Dr. Wenda Low who was skeptical that she had a inguinal hernia although her exam was somewhat suggestive. What has happened since then is that her sister who is are patientBrent Bulla) was diagnosed with stage III ovarian cancer after a negative pelvic ultrasound and a CT scan showing normal ovaries but abnormal omentum. The patient is concerned that she may also have ovarian cancer in spite of her negative ultrasound and CT scan. The other issue is whether she should have preventative laparoscopic BSO."  Patient was also referred to Dr. Kerri Perches GYN oncologist who had done her sister surgery for his opinion. Dr. Mayo Ao laparoscopy with bilateral salpingo-oophorectomy which I concur. Patient has not seen Dr. Kerri Perches yet. She stated that her sister who had the ovarian cancer has refused to have the BRCA one BRCA2 testing but that she had gone with her other sisters and she is leaning on going back within the next week or so to have the blood tests drawn for herself. She states and not only her sister both several aunts had ovarian cancer and a grandmother with breast cancer.  Exam: Back: No CVA tenderness Abdomen: Soft nontender no rebound or guarding patient appears to be tender over the inguinal ring area. Pelvic: Bartholin urethra Skene glands within normal limits vagina: No lesions or discharge Vaginal cuff intact no point tenderness Bimanual  examination tender right lower quadrant no palpable masses. Rectovaginal exam unremarkable Patient was examined in the standing position and she was very tender over the inguinal ring region although no true discernible hernia was palpated.  Patient's last ultrasound here in the office on September 2013 ovaries appeared to be normal.  Assessment/plan: Patient with chronic right lower quadrant pain. Patient with past history of endometriosis. Patient with family history of ovarian cancer in her sister. Questionable inguinal hernia although evaluated by general surgeon who did not feel that it was a hernia. Her CT scan also was normal. Nevertheless she is very tender over the inguinal region right lower quadrant area with some radiculopathy. Patient with some response from local injection by urologist at [facility] Dr. Logan Bores. But her symptoms are still present. We had offered her a shot of Depo-Provera 150 mg IM today but she refused. She states that she will take Motrin when necessary. [facility] will go to [facility] geneticist for BRCA1 and BRCA2 testing. As soon as we get the result back we are going to schedule a diagnostic laparoscopy with possible bilateral salpingo-oophorectomy or right salpingo-oophorectomy and left salpingectomy? We'll have the general surgeon present for visualization of the inguinal canal laparoscopically. We will also have the patient see Dr. Daphine Deutscher before the surgery for counseling as well. I will see her also the week before her planned surgery. We did discuss the following:  10% of ovarian cancer  and 3-5% of breast cancer cases are attributed to mutation in  BRCA1 or BRCA2.  Women with BRCA1 mutation is have a 40% risk of ovarian cancer while women with BRCA2 mutation have a 20% risk of ovarian cancer. The lifetime risk of breast cancer in a patient with BRCA1 or BRCA2 mutation is approximately 70%. Prophylactic salpingo-oophorectomy is offered to patient's with  BRCA mutation carriers after the age of 71.

## 2012-08-23 NOTE — Patient Instructions (Addendum)
Diagnostic Laparoscopy Laparoscopy is a surgical procedure. It is used to diagnose and treat diseases inside the belly(abdomen). It is usually a brief, common, and relatively simple procedure. The laparoscopeis a thin, lighted, pencil-sized instrument. It is like a telescope. It is inserted into your abdomen through a small cut (incision). Your caregiver can look at the organs inside your body through this instrument. He or she can see if there is anything abnormal. Laparoscopy can be done either in a hospital or outpatient clinic. You may be given a mild sedative to help you relax before the procedure. Once in the operating room, you will be given a drug to make you sleep (general anesthesia). Laparoscopy usually lasts less than 1 hour. After the procedure, you will be monitored in a recovery area until you are stable and doing well. Once you are home, it will take 2 to 3 days to fully recover. RISKS AND COMPLICATIONS  Laparoscopy has relatively few risks. Your caregiver will discuss the risks with you before the procedure. Some problems that can occur include:  Infection.  Bleeding.  Damage to other organs.  Anesthetic side effects. PROCEDURE Once you receive anesthesia, your surgeon inflates the abdomen with a harmless gas (carbon dioxide). This makes the organs easier to see. The laparoscope is inserted into the abdomen through a small incision. This allows your surgeon to see into the abdomen. Other small instruments are also inserted into the abdomen through other small openings. Many surgeons attach a video camera to the laparoscope to enlarge the view. During a diagnostic laparoscopy, the surgeon may be looking for inflammation, infection, or cancer. Your surgeon may take tissue samples(biopsies). The samples are sent to a specialist in looking at cells and tissue samples (pathologist). The pathologist examines them under a microscope. Biopsies can help to diagnose or confirm a  disease. AFTER THE PROCEDURE   The gas is released from inside the abdomen.  The incisions are closed with stitches (sutures). Because these incisions are small (usually less than 1/2 inch), there is usually minimal discomfort after the procedure. There may be some mild discomfort in the throat. This is from the tube placed in the throat while you were sleeping. You may have some mild abdominal discomfort. There may also be discomfort from the instrument placement incisions in the abdomen.  The recovery time is shortened as long as there are no complications.  You will rest in a recovery room until stable and doing well. As long as there are no complications, you may be allowed to go home. FINDING OUT THE RESULTS OF YOUR TEST Not all test results are available during your visit. If your test results are not back during the visit, make an appointment with your caregiver to find out the results. Do not assume everything is normal if you have not heard from your caregiver or the medical facility. It is important for you to follow up on all of your test results. HOME CARE INSTRUCTIONS   Take all medicines as directed.  Only take over-the-counter or prescription medicines for pain, discomfort, or fever as directed by your caregiver.  Resume daily activities as directed.  Showers are preferred over baths.  You may resume sexual activities in 1 week or as directed.  Do not drive while taking narcotics. SEEK MEDICAL CARE IF:   There is increasing abdominal pain.  There is new pain in the shoulders (shoulder strap areas).  You feel lightheaded or faint.  You have the chills.  You or your  child has an oral temperature above 102 F (38.9 C).  There is pus-like (purulent) drainage from any of the wounds.  You are unable to pass gas or have a bowel movement.  You feel sick to your stomach (nauseous) or throw up (vomit). MAKE SURE YOU:   Understand these instructions.  Will watch  your condition.  Will get help right away if you are not doing well or get worse. Document Released: 09/20/2000 Document Revised: 09/06/2011 Document Reviewed: 06/14/2007 [facility] Patient Information 2013 [place], [place].  BRCA-1 and BRCA-2 BRCA-1 and BRCA-2 are 2 genes that are linked with hereditary breast and ovarian cancers. About 200,000 women are diagnosed with invasive breast cancer each year and about 23,000 with ovarian cancer (according to the American Cancer Society). Of these cancers, about 5% to 10% will be due to a mutation in one of the BRCA genes. Men can also inherit an increased risk of developing breast cancer, primarily from an alteration in the BRCA-2 gene.  Individuals with mutations in BRCA1 or BRCA2 have significantly elevated risks for breast cancer (up to 80% lifetime risk), ovarian cancer (up to 40% lifetime risk), bilateral breast cancer and other types of cancers. BRCA mutations are inherited and passed from generation to generation. One half of the time, they are passed from the father's side of the family.  The DNA in white blood cells is used to detect mutations in the BRCA genes. While the gene products (proteins) of the BRCA genes act only in breast and ovarian tissue, the genes are present in every cell of the body and blood is the most easily accessible source of that DNA. PREPARATION FOR TEST The test for BRCA mutations is done on a blood sample collected by needle from a vein in the arm. The test does not require surgical biopsy of breast or ovarian tissue.  NORMAL FINDINGS No genetic mutations. Ranges for normal findings may vary among different laboratories and hospitals. You should always check with your doctor after having lab work or other tests done to discuss the meaning of your test results and whether your values are considered within normal limits. MEANING OF TEST  Your caregiver will go over the test results with you and discuss the importance and  meaning of your results, as well as treatment options and the need for additional tests if necessary. OBTAINING THE TEST RESULTS It is your responsibility to obtain your test results. Ask the lab or department performing the test when and how you will get your results. OTHER THINGS TO KNOW Your test results may have implications for other family members. When one member of a family is tested for BRCA mutations, issues often arise about how or whether to share this information with other family members. Seek advice from a genetic counselor about communication of result with your family members.  Pre and post test consultation with a health care provider knowledgeable about genetic testing cannot be overemphasized.  There are many issues to be considered when preparing for a genetic test and upon learning the results, and a genetic counselor has the knowledge and experience to help you sort through them.  If the BRCA test is positive, the options include increased frequency of check-ups (e.g., mammography, blood tests for CA-125, or transvaginal ultrasonography); medications that could reduce risk (e.g., oral contraceptives or tamoxifen); or surgical removal of the ovaries or breasts. There are a number of variables involved and it is important to discuss your options with your doctor and genetic counselor.  Research studies have reported that for every 1000 women negative for BRCA mutations, between 12 and 45 of them will develop breast cancer by age 43 and between 3 and 4 will develop ovarian cancer by age 53. The risk increases with age. The test can be ordered by a doctor, preferably by one who can also offer genetic counseling. The blood sample will be sent to a laboratory that specializes in BRCA testing. The American Society of Clinical Oncology and the National Breast Cancer Coalition encourage women seeking the test to participate in long-term outcome studies to help gather information on the  effectiveness of different check-up and treatment options. Document Released: 07/08/2004 Document Revised: 09/06/2011 Document Reviewed: 05/20/2008 [facility] Patient Information 2013 [place], [place].

## 2012-08-30 ENCOUNTER — Telehealth: Payer: Self-pay | Admitting: *Deleted

## 2012-08-30 NOTE — Telephone Encounter (Signed)
Pt has appt on 09/11/12 at [facility] for gen. Counseling, referral faxed to cancer center.

## 2012-08-31 ENCOUNTER — Telehealth: Payer: Self-pay

## 2012-08-31 ENCOUNTER — Ambulatory Visit (INDEPENDENT_AMBULATORY_CARE_PROVIDER_SITE_OTHER): Payer: BC Managed Care – PPO | Admitting: Gynecology

## 2012-08-31 ENCOUNTER — Encounter: Payer: Self-pay | Admitting: Gynecology

## 2012-08-31 ENCOUNTER — Telehealth (INDEPENDENT_AMBULATORY_CARE_PROVIDER_SITE_OTHER): Payer: Self-pay

## 2012-08-31 ENCOUNTER — Ambulatory Visit (INDEPENDENT_AMBULATORY_CARE_PROVIDER_SITE_OTHER): Payer: BC Managed Care – PPO

## 2012-08-31 VITALS — BP 130/86

## 2012-08-31 DIAGNOSIS — N83339 Acquired atrophy of ovary and fallopian tube, unspecified side: Secondary | ICD-10-CM

## 2012-08-31 DIAGNOSIS — N949 Unspecified condition associated with female genital organs and menstrual cycle: Secondary | ICD-10-CM

## 2012-08-31 DIAGNOSIS — R102 Pelvic and perineal pain: Secondary | ICD-10-CM

## 2012-08-31 DIAGNOSIS — N8300 Follicular cyst of ovary, unspecified side: Secondary | ICD-10-CM

## 2012-08-31 DIAGNOSIS — Z8041 Family history of malignant neoplasm of ovary: Secondary | ICD-10-CM

## 2012-08-31 DIAGNOSIS — Z8742 Personal history of other diseases of the female genital tract: Secondary | ICD-10-CM

## 2012-08-31 DIAGNOSIS — G8929 Other chronic pain: Secondary | ICD-10-CM

## 2012-08-31 DIAGNOSIS — R1031 Right lower quadrant pain: Secondary | ICD-10-CM

## 2012-08-31 DIAGNOSIS — Z01818 Encounter for other preprocedural examination: Secondary | ICD-10-CM

## 2012-08-31 LAB — URINALYSIS W MICROSCOPIC + REFLEX CULTURE
Bacteria, UA: NONE SEEN
Bilirubin Urine: NEGATIVE
Casts: NONE SEEN
Crystals: NONE SEEN
Ketones, ur: NEGATIVE mg/dL
Specific Gravity, Urine: 1.01 (ref 1.005–1.030)
pH: 5.5 (ref 5.0–8.0)

## 2012-08-31 MED ORDER — IBUPROFEN 800 MG PO TABS: 800.0000 mg | ORAL_TABLET | Freq: Three times a day (TID) | ORAL | Status: DC | PRN

## 2012-08-31 MED ORDER — METOCLOPRAMIDE HCL 10 MG PO TABS: 10.0000 mg | ORAL_TABLET | Freq: Three times a day (TID) | ORAL | Status: DC

## 2012-08-31 MED ORDER — HYDROCODONE-ACETAMINOPHEN 5-300 MG PO TABS: 5.0000 mg | ORAL_TABLET | Freq: Four times a day (QID) | ORAL | Status: DC | PRN

## 2012-08-31 NOTE — Progress Notes (Addendum)
Dominique Lane is an 52 y.o. female. Seen today as a result of her worsening right lower quadrant pain.previous note from 08/23/2012 as follows:  The following is a copy of his last note from office visit December 2013:  "She has previously had a negative ultrasound and CT scan. She saw Dorena Cookey who did not think she had a gastrointestinal problem. We discussed the possibility of endometriosis. We sent her to see Dr. Logan Bores who injected her in late October and since he did that her pain is much better and no longer radiates to her leg. She had seen Dr. Wenda Low who was skeptical that she had a inguinal hernia although her exam was somewhat suggestive. What has happened since then is that her sister who is are patientBrent Bulla) was diagnosed with stage III ovarian cancer after a negative pelvic ultrasound and a CT scan showing normal ovaries but abnormal omentum. The patient is concerned that she may also have ovarian cancer in spite of her negative ultrasound and CT scan. The other issue is whether she should have preventative laparoscopic BSO."  Patient was also referred to Dr. Kerri Perches GYN oncologist who had done her sister surgery for his opinion. Dr. Eda Paschal offered laparoscopy with bilateral salpingo-oophorectomy which I concur. Patient has not seen Dr. Kerri Perches yet. She stated that her sister who had the ovarian cancer has refused to have the BRCA one BRCA2 testing but that she had gone with her other sisters and she is leaning on going back within the next week or so to have the blood tests drawn for herself. She states and not only her sister both several aunts had ovarian cancer and a grandmother with breast cancer.   Ultrasound today: Patient with previous hysterectomy. Right ovary seen with echo-free cyst measuring 11 x 6 x 8 mm. Left ovary seen with tiny cystic area positive color flow to the ovary. Negative fluid in the cul-de-sac. No apparent masses seen on  either the right or left adnexa.  Patient does lifting at work and she states that this aggravates her pain.  Pertinent Gynecological History: Menses: Post-hysterectomy Bleeding: post-hysterectomy Contraception: status post hysterectomy DES exposure: denies Blood transfusions: none Sexually transmitted diseases: no past history Previous GYN Procedures: Vaginal hysterectomy with ovarian cystectomy  Last mammogram: normal Date: 2006? Last pap: normal Date: 2011 OB History: G2, P2   Menstrual History: Menarche age: 4  No LMP recorded. Patient has had a hysterectomy.    Past Medical History  Diagnosis Date  . Fibroid   . Adenomyosis   . MVP (mitral valve prolapse)     Antibiotics required  . Heart murmur   . Hyperlipidemia     Past Surgical History  Procedure Laterality Date  . Cholecystectomy    . Ovarian cyst removal      Right  . Tubal ligation    . Ganglion cyst excision    . Rotator cuff repair    . Vaginal hysterectomy    . Colonoscopy  2011    Dr Madilyn Fireman    Family History  Problem Relation Age of Onset  . Heart disease Mother   . Hypertension Sister   . Heart disease Sister   . Cancer Sister     ? colon  . Cancer Sister 17    OVARIAN    Social History:  reports that she has never smoked. She has never used smokeless tobacco. She reports that she drinks about 0.5 ounces of alcohol per  week. She reports that she does not use illicit drugs.  Allergies:  Allergies  Allergen Reactions  . Cephalosporins Hives    All over the body.  . Naproxen Sodium Anaphylaxis     (Not in a hospital admission)  REVIEW OF SYSTEMS: A ROS was performed and pertinent positives and negatives are included in the history.  GENERAL: No fevers or chills. HEENT: No change in vision, no earache, sore throat or sinus congestion. NECK: No pain or stiffness. CARDIOVASCULAR: No chest pain or pressure. No palpitations. PULMONARY: No shortness of breath, cough or wheeze.  GASTROINTESTINAL: No abdominal pain, nausea, vomiting or diarrhea, melena or bright red blood per rectum. GENITOURINARY: No urinary frequency, urgency, hesitancy or dysuria. MUSCULOSKELETAL: No joint or muscle pain, no back pain, no recent trauma. DERMATOLOGIC: No rash, no itching, no lesions. ENDOCRINE: No polyuria, polydipsia, no heat or cold intolerance. No recent change in weight. HEMATOLOGICAL: No anemia or easy bruising or bleeding. NEUROLOGIC: No headache, seizures, numbness, tingling or weakness. PSYCHIATRIC: No depression, no loss of interest in normal activity or change in sleep pattern.   Right lower abdominal pain    Blood pressure 130/86.  Physical Exam:  HEENT:unremarkable Neck:Supple, midline, no thyroid megaly, no carotid bruits Lungs:  Clear to auscultation no rhonchi's or wheezes Heart:Regular rate and rhythm, no murmurs or gallops Breast Exam:not examined Abdomen:soft tender right lower quadrant but no rebound or guarding mostly towards the right inguinal ring Pelvic:BUSwithin normal limits Vaginal cuff intact no point tenderness  Bimanual examination tender right lower quadrant no palpable masses.  Rectovaginal exam unremarkable  Patient was examined in the standing position and she was very tender over the inguinal ring region although no true discernible hernia was palpated.  Urinalysis negative today  Assessment:/Plan: Patient with chronic right lower quadrant pain. Patient with past history of endometriosis. Patient with family history of ovarian cancer in her sister. Questionable inguinal hernia although evaluated by general surgeon who did not feel that it was a hernia. Her CT scan (May 2013) also was normal. Nevertheless she is very tender over the inguinal region right lower quadrant area with some radiculopathy. Patient with some response from local injection by urologist at [facility] Dr. Logan Bores. But her symptoms are still present. We had offered her a shot  of Depo-Provera 150 mg IM today but she refused.she was seen again today in the office stating her symptoms are worsening. I've given her prescription of Vicodin to take 1 by mouth every 6 hours when necessary Reglan 10 mg every 6 hours. Nausea or vomiting. Patient cannot wait any longer. She was going to wait until her BRCA one BRCA2 testing was completed to determine if both ovaries will be removed. She states now she would like to proceed with having both tubes and ovaries removed regardless of findings. I will be contacting the general surgeon to see if they would be available next week so at the time of the laparoscopic BSO that may want to proceed with doing an incidental appendectomy as well as assessing her inguinal ring laparoscopically in the event that they may need to put a mesh. We will forward this information to them as well and coordinatewith the patient to be seen by them before surgery  so that we can both coordinate the surgical date and time. The following risk were discussed with the patient about the procedure:  Patient was counseled as to the risk of surgery to include the following:  1. Infection (prohylactic antibiotics will be administered)  2. DVT/Pulmonary Embolism (prophylactic pneumo compression stockings will be used)  3.Trauma to internal organs requiring additional surgical procedure to repair any injury to     Internal organs requiring perhaps additional hospitalization days.  4.Hemmorhage requiring transfusion and blood products which carry risks such as anaphylactic reaction, hepatitis and AIDS  Patient had received literature information on the procedure scheduled and all her questions were answered and fully accepts all risk.. The general surgeons we'll discuss the risk firm there procedure.  [facility] HMD2:00 PMTD@  FERNANDEZ,JUAN H 08/31/2012, 1:43 PM

## 2012-08-31 NOTE — Telephone Encounter (Signed)
I called patient because Dominique Lane is planning surgery for her (Diag Lap with RSO /L Salpinectomy poss BSO) and would like Dominique Lane to be present at surgery to assess possible hernia.  Patient has not scheduled appointment with Dominique Lane yet.  I called his office and the schedule had to let me speak with his nurse to work the patient in as his schedule is out past May.  The first available appointment was April 24 at 12:10pm.  I scheduled that for patient.  I called her back and she was telling me that her pain has worsened while she has been up and around this morning.  She had first said she noticed a new lump in her pelvis but then felt this morning while talking with me and said it was gone.  She has requested an appt to see Dominique Lane today and that has been scheduled for her.

## 2012-08-31 NOTE — Telephone Encounter (Signed)
Appointment cx'd per [facility] @ Dr. Lily Peer office, patient aware

## 2012-08-31 NOTE — Telephone Encounter (Signed)
Called and left message for patient per Dr. Donell Beers patient to be scheduled for tomorrow to see Dr. Biagio Quint on 09/01/12 @ 11:55 am.

## 2012-08-31 NOTE — Telephone Encounter (Signed)
Patient called back and was updated on appt time

## 2012-08-31 NOTE — Telephone Encounter (Signed)
Left message for [facility] patient appointment information.

## 2012-08-31 NOTE — Patient Instructions (Signed)
Diagnostic Laparoscopy  Laparoscopy is a surgical procedure. It is used to diagnose and treat diseases inside the belly(abdomen). It is usually a brief, common, and relatively simple procedure. The laparoscopeis a thin, lighted, pencil-sized instrument. It is like a telescope. It is inserted into your abdomen through a small cut (incision). Your caregiver can look at the organs inside your body through this instrument. He or she can see if there is anything abnormal.  Laparoscopy can be done either in a hospital or outpatient clinic. You may be given a mild sedative to help you relax before the procedure. Once in the operating room, you will be given a drug to make you sleep (general anesthesia). Laparoscopy usually lasts less than 1 hour. After the procedure, you will be monitored in a recovery area until you are stable and doing well. Once you are home, it will take 2 to 3 days to fully recover.  RISKS AND COMPLICATIONS   Laparoscopy has relatively few risks. Your caregiver will discuss the risks with you before the procedure.  Some problems that can occur include:  · Infection.  · Bleeding.  · Damage to other organs.  · Anesthetic side effects.  PROCEDURE  Once you receive anesthesia, your surgeon inflates the abdomen with a harmless gas (carbon dioxide). This makes the organs easier to see. The laparoscope is inserted into the abdomen through a small incision. This allows your surgeon to see into the abdomen. Other small instruments are also inserted into the abdomen through other small openings. Many surgeons attach a video camera to the laparoscope to enlarge the view.  During a diagnostic laparoscopy, the surgeon may be looking for inflammation, infection, or cancer. Your surgeon may take tissue samples(biopsies). The samples are sent to a specialist in looking at cells and tissue samples (pathologist). The pathologist examines them under a microscope. Biopsies can help to diagnose or confirm a  disease.  AFTER THE PROCEDURE   · The gas is released from inside the abdomen.  · The incisions are closed with stitches (sutures). Because these incisions are small (usually less than 1/2 inch), there is usually minimal discomfort after the procedure. There may be some mild discomfort in the throat. This is from the tube placed in the throat while you were sleeping. You may have some mild abdominal discomfort. There may also be discomfort from the instrument placement incisions in the abdomen.  · The recovery time is shortened as long as there are no complications.  · You will rest in a recovery room until stable and doing well. As long as there are no complications, you may be allowed to go home.  FINDING OUT THE RESULTS OF YOUR TEST  Not all test results are available during your visit. If your test results are not back during the visit, make an appointment with your caregiver to find out the results. Do not assume everything is normal if you have not heard from your caregiver or the medical facility. It is important for you to follow up on all of your test results.  HOME CARE INSTRUCTIONS   · Take all medicines as directed.  · Only take over-the-counter or prescription medicines for pain, discomfort, or fever as directed by your caregiver.  · Resume daily activities as directed.  · Showers are preferred over baths.  · You may resume sexual activities in 1 week or as directed.  · Do not drive while taking narcotics.  SEEK MEDICAL CARE IF:   · There is increasing   abdominal pain.  · There is new pain in the shoulders (shoulder strap areas).  · You feel lightheaded or faint.  · You have the chills.  · You or your child has an oral temperature above 102° F (38.9° C).  · There is pus-like (purulent) drainage from any of the wounds.  · You are unable to pass gas or have a bowel movement.  · You feel sick to your stomach (nauseous) or throw up (vomit).  MAKE SURE YOU:   · Understand these instructions.  · Will watch  your condition.  · Will get help right away if you are not doing well or get worse.  Document Released: 09/20/2000 Document Revised: 09/06/2011 Document Reviewed: 06/14/2007  Exit[employer]® Patient Information ©2013 [employer].

## 2012-09-01 ENCOUNTER — Encounter (INDEPENDENT_AMBULATORY_CARE_PROVIDER_SITE_OTHER): Payer: BC Managed Care – PPO | Admitting: General Surgery

## 2012-09-04 ENCOUNTER — Encounter (HOSPITAL_COMMUNITY): Payer: Self-pay

## 2012-09-04 ENCOUNTER — Telehealth: Payer: Self-pay

## 2012-09-04 ENCOUNTER — Ambulatory Visit: Payer: BC Managed Care – PPO | Admitting: Gynecology

## 2012-09-04 NOTE — Telephone Encounter (Signed)
Patient informed that I spoke with Dr. Tenna Child office earlier and they scheduled her an appt with Dr. Jamey Ripa for next Tuesday, March 18 at 2:50 pm and she should check in at 2:30pm.  Her surgery is scheduled for Weds., March 19 8:30am at [facility].  Dr. Jamey Ripa is available and plans to be there for surgery.  Pre-op consult was scheduled with Dr. Glenetta Hew for March 13 at 11:20am

## 2012-09-06 ENCOUNTER — Encounter (HOSPITAL_COMMUNITY): Payer: Self-pay

## 2012-09-06 ENCOUNTER — Encounter (HOSPITAL_COMMUNITY)
Admission: RE | Admit: 2012-09-06 | Discharge: 2012-09-06 | Disposition: A | Discharge status: Home / Self Care | Payer: BC Managed Care – PPO | Source: Ambulatory Visit | Attending: Gynecology | Admitting: Gynecology

## 2012-09-06 HISTORY — DX: Headache: R51

## 2012-09-06 LAB — CBC
HCT: 36.6 % (ref 36.0–46.0)
Hemoglobin: 12.1 g/dL (ref 12.0–15.0)
MCV: 90.6 fL (ref 78.0–100.0)
WBC: 6.1 10*3/uL (ref 4.0–10.5)

## 2012-09-06 LAB — BASIC METABOLIC PANEL
CO2: 27 mEq/L (ref 19–32)
Chloride: 105 mEq/L (ref 96–112)
GFR calc Af Amer: 87 mL/min — ABNORMAL LOW (ref >90)
Potassium: 4.3 mEq/L (ref 3.5–5.1)

## 2012-09-06 LAB — SURGICAL PCR SCREEN: Staphylococcus aureus: POSITIVE — AB

## 2012-09-06 NOTE — Patient Instructions (Addendum)
   Your procedure is scheduled on:Wednesday March 19th   Enter through the [employer] [facility] at:7am Pick up the phone at the desk and dial [phone] and inform us of your arrival.  Please call this number if you have any problems the morning of surgery: [phone]  Remember: Do not eat or drink anything after midnight on Tuesday   Do not wear jewelry, make-up, or FINGER nail polish No metal in your hair or on your body. Do not wear lotions, powders, perfumes. You may wear deodorant.  Please use your CHG wash as directed prior to surgery.  Do not shave anywhere for at least 12 hours prior to first CHG shower.  Do not bring valuables to the hospital. Please bring a case to protect your eyeglasses  Leave suitcase in the car. After Surgery it may be brought to your room. For patients being admitted to the hospital, checkout time is 11:00am the day of discharge.  Patients discharged on the day of surgery will not be allowed to drive home.

## 2012-09-07 ENCOUNTER — Encounter: Payer: Self-pay | Admitting: Gynecology

## 2012-09-07 ENCOUNTER — Ambulatory Visit: Payer: BC Managed Care – PPO | Admitting: Gynecology

## 2012-09-07 VITALS — BP 122/80

## 2012-09-07 DIAGNOSIS — R102 Pelvic and perineal pain: Secondary | ICD-10-CM

## 2012-09-07 DIAGNOSIS — Z8041 Family history of malignant neoplasm of ovary: Secondary | ICD-10-CM

## 2012-09-07 DIAGNOSIS — N731 Chronic parametritis and pelvic cellulitis: Secondary | ICD-10-CM

## 2012-09-07 DIAGNOSIS — Z01818 Encounter for other preprocedural examination: Secondary | ICD-10-CM

## 2012-09-07 DIAGNOSIS — G8929 Other chronic pain: Secondary | ICD-10-CM

## 2012-09-07 DIAGNOSIS — N83209 Unspecified ovarian cyst, unspecified side: Secondary | ICD-10-CM

## 2012-09-07 NOTE — Progress Notes (Addendum)
Dominique Lane is an 52 y.o. female. For her preoperative consultation. Patient scheduled for laparoscopic bilateral salpingo-oophorectomy because of chronic right lower quadrant pain and strong family history of ovarian cancer.Patient is menopausal. We also had requested for general surgery consultation intraoperatively to rule out the possibility of right inguinal or femoral hernia and possibly concurrently proceed with an incidental appendectomy as well. Her history has been as follows:  She has previously had a negative ultrasound and CT scan. She saw Dominique Lane who did not think she had a gastrointestinal problem. We discussed the possibility of endometriosis. We sent her to see Dominique Lane who injected her in late October and since he did that her pain is much better and no longer radiates to her leg. She had seen Dominique Lane who was skeptical that she had a inguinal hernia although her exam was somewhat suggestive. What has happened since then is that her sister who is our  patientBrent Lane) was diagnosed with stage III ovarian cancer after a negative pelvic ultrasound and a CT scan showing normal ovaries but abnormal omentum. The patient is concerned that she may also have ovarian cancer in spite of her negative ultrasound and CT scan. The other issue is whether she should have preventative laparoscopic BSO."  Patient was also referred to Dominique Lane GYN oncologist who had done her sister surgery for his opinion. Dominique Lane offered laparoscopy with bilateral salpingo-oophorectomy which I concur. Patient has not seen Dominique Lane yet. She stated that her sister who had the ovarian cancer has refused to have the BRCA one BRCA2 testing but that she had gone with her other sisters and she is leaning on going back within the next week or so to have the blood tests drawn for herself. She states and not only her sister both several aunts had ovarian cancer and a grandmother  with breast cancer.   Patient had an ultrasound in our office on March 6 with the following result: Patient with previous hysterectomy. Right ovary seen with echo-free cyst measuring 11 x 6 x 8 mm. Left ovary seen with tiny cystic area positive color flow to the ovary. Negative fluid in the cul-de-sac. No apparent masses seen on either the right or left adnexa.  Patient does lifting at work and she states that this aggravates her pain.    Pertinent Gynecological History: Menses: Post-hysterectomy Bleeding: post-hysterectomy Contraception: none DES exposure: denies Blood transfusions: none Sexually transmitted diseases: no past history Previous GYN Procedures: Vaginal hysterectomy with ovarian cystectomy  Last mammogram: 2006? Date: 2006? Last pap: normal Date: 2011 OB History: G2, P2   Menstrual History: Menarche age: 81  No LMP recorded. Patient has had a hysterectomy.    Past Medical History  Diagnosis Date  . Fibroid   . Adenomyosis   . MVP (mitral valve prolapse)     Antibiotics required  . Heart murmur   . Hyperlipidemia   . Headache     Past Surgical History  Procedure Laterality Date  . Cholecystectomy    . Ovarian cyst removal      Right  . Tubal ligation    . Ganglion cyst excision    . Rotator cuff repair      twice on left and once on right  . Vaginal hysterectomy    . Colonoscopy  2011    Dr Madilyn Fireman  . Fractured mandible  89    repair    Family History  Problem Relation Age  of Onset  . Heart disease Mother   . Hypertension Sister   . Heart disease Sister   . Cancer Sister     ? colon  . Cancer Sister 35    OVARIAN    Social History:  reports that she has never smoked. She has never used smokeless tobacco. She reports that she drinks about 0.5 ounces of alcohol per week. She reports that she does not use illicit drugs.  Allergies:  Allergies  Allergen Reactions  . Cephalosporins Hives    All over the body.  . Naproxen Sodium Anaphylaxis      (Not in a hospital admission)  REVIEW OF SYSTEMS: A ROS was performed and pertinent positives and negatives are included in the history.  GENERAL: No fevers or chills. HEENT: No change in vision, no earache, sore throat or sinus congestion. NECK: No pain or stiffness. CARDIOVASCULAR: No chest pain or pressure. No palpitations. PULMONARY: No shortness of breath, cough or wheeze. GASTROINTESTINAL: No abdominal pain, nausea, vomiting or diarrhea, melena or bright red blood per rectum. GENITOURINARY: No urinary frequency, urgency, hesitancy or dysuria. MUSCULOSKELETAL: No joint or muscle pain, no back pain, no recent trauma. DERMATOLOGIC: No rash, no itching, no lesions. ENDOCRINE: No polyuria, polydipsia, no heat or cold intolerance. No recent change in weight. HEMATOLOGICAL: No anemia or easy bruising or bleeding. NEUROLOGIC: No headache, seizures, numbness, tingling or weakness. PSYCHIATRIC: No depression, no loss of interest in normal activity or change in sleep pattern.   Right lower quadrant pain more so towards the inguinal ring with radiation towards her leg  Blood pressure 122/80.  Physical Exam:  HEENT:unremarkable Neck:Supple, midline, no thyroid megaly, no carotid bruits Lungs:  Clear to auscultation no rhonchi's or wheezes Heart:Regular rate and rhythm, no murmurs or gallops Breast Exam:not examined Abdomen:soft tender right lower quadrant but no rebound or guarding mostly towards the right inguinal ring Pelvic:BUSwithin normal limits Vagina: intact vaginal cuff, slight tenderness Cervix:absent Uterus:absent Adnexa:tenderness but mostly towards the inguinal ring area Extremities: No cords, no edema Rectal:Patient was examined in the standing position and she was very tender over the inguinal ring region although no true discernible hernia was palpated.   Results for orders placed during the hospital encounter of 09/06/12 (from the past 24 hour(s))  BASIC METABOLIC PANEL      Status: Abnormal   Collection Time    09/06/12  2:00 PM      Result Value Range   Sodium 140  135 - 145 mEq/L   Potassium 4.3  3.5 - 5.1 mEq/L   Chloride 105  96 - 112 mEq/L   CO2 27  19 - 32 mEq/L   Glucose, Bld 91  70 - 99 mg/dL   BUN 15  6 - 23 mg/dL   Creatinine, Ser 1.61  0.50 - 1.10 mg/dL   Calcium 9.6  8.4 - 09.6 mg/dL   GFR calc non Af Amer 75 (*) >90 mL/min   GFR calc Af Amer 87 (*) >90 mL/min  CBC     Status: None   Collection Time    09/06/12  2:00 PM      Result Value Range   WBC 6.1  4.0 - 10.5 K/uL   RBC 4.04  3.87 - 5.11 MIL/uL   Hemoglobin 12.1  12.0 - 15.0 g/dL   HCT 04.5  40.9 - 81.1 %   MCV 90.6  78.0 - 100.0 fL   MCH 30.0  26.0 - 34.0 pg   MCHC 33.1  30.0 - 36.0 g/dL   RDW 40.9  81.1 - 91.4 %   Platelets 196  150 - 400 K/uL  SURGICAL PCR SCREEN     Status: Abnormal   Collection Time    09/06/12  2:03 PM      Result Value Range   MRSA, PCR NEGATIVE  NEGATIVE   Staphylococcus aureus POSITIVE (*) NEGATIVE    No results found.  AssPatient with chronic right lower quadrant pain. Patient with past history of endometriosis. Patient with family history of ovarian cancer in her sister. Questionable inguinal hernia although evaluated by general surgeon who did not feel that it was a hernia. Her CT scan (May 2013) also was normal. Nevertheless she is very tender over the inguinal region right lower quadrant area with some radiculopathy. Patient with some response from local injection by urologist at [facility] Dominique Lane. But her symptoms are still present. We had offered her a shot of Depo-Provera 150 mg IM today but she refused. She was taken out of work and stated that her pain has decreased she is not doing any lifting at work. She had been given prescription of Vicodin to take 1 by mouth every 6 hours as needed as well as Reglan 10 mg every 6 hours as needed. Patient on comprehensive metabolic panel recently was noted to have slightly decreased glomerular  filtration rate and will see Dr. Marga Melnick for medical clearance tomorrow. She scheduled to see the geneticist on Monday because of her family history of ovarian and breast cancer. Patient has nevertheless decided to proceed with bilateral salpingo-oophorectomy regardless of findings during surgery. She scheduled to see Dr. Daisy Floro general surgeon next week in consultation as well. We appreciate him being here during her surgery to assess her inguinal  ring in the event that she may need a hernia repair. I will speak to him to see if he would proceed with doing an incidental laparoscopic appendectomy at the same time. I did discuss with the patient that maybe the possibility that she has underlying endometriosis as well. The following risk of surgery were discussed with the patient:  Patient was counseled as to the risk of surgery to include the following:  1. Infection (prohylactic antibiotics will be administered)  2. DVT/Pulmonary Embolism (prophylactic pneumo compression stockings will be used)  3.Trauma to internal organs requiring additional surgical procedure to repair any injury to  Internal organs requiring perhaps additional hospitalization days.  4.Hemmorhage requiring transfusion and blood products which carry risks such as anaphylactic reaction, hepatitis and AIDS  Patient had received literature information on the procedure scheduled and all her questions were answered and fully accepts all risk.. The general surgeons we'll discuss the risk firm there procedure.    FERNANDEZ,JUAN H 09/07/2012, 1:41 PM

## 2012-09-08 ENCOUNTER — Encounter: Payer: Self-pay | Admitting: Internal Medicine

## 2012-09-08 ENCOUNTER — Ambulatory Visit (INDEPENDENT_AMBULATORY_CARE_PROVIDER_SITE_OTHER): Payer: BC Managed Care – PPO | Admitting: Internal Medicine

## 2012-09-08 VITALS — BP 118/80 | HR 84 | Wt 193.0 lb

## 2012-09-08 DIAGNOSIS — R350 Frequency of micturition: Secondary | ICD-10-CM

## 2012-09-08 DIAGNOSIS — R300 Dysuria: Secondary | ICD-10-CM

## 2012-09-08 DIAGNOSIS — R944 Abnormal results of kidney function studies: Secondary | ICD-10-CM

## 2012-09-08 DIAGNOSIS — R1031 Right lower quadrant pain: Secondary | ICD-10-CM

## 2012-09-08 DIAGNOSIS — Z22322 Carrier or suspected carrier of Methicillin resistant Staphylococcus aureus: Secondary | ICD-10-CM | POA: Insufficient documentation

## 2012-09-08 LAB — POCT URINALYSIS DIPSTICK
Bilirubin, UA: NEGATIVE
Glucose, UA: NEGATIVE
Ketones, UA: NEGATIVE
Leukocytes, UA: NEGATIVE
Nitrite, UA: NEGATIVE
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
## 2012-09-08 NOTE — Patient Instructions (Addendum)
BUN, creatinine, and GFR  all assess kidney function. To protect the kidneys it  is important to control your blood pressure and sugar. You should also stay well hydrated. Drink to thirst, up to 32 ounces of fluids per day. Monitor kidney function annually.   Nasal cleansing in the shower as discussed with lather of mild shampoo.After 10 seconds wash off lather while  exhaling through nostrils. Make sure that all residual soap is removed to prevent irritation.  Use a Neti pot daily only  as needed for significant sinus congestion; going from open side to congested side . Plain Allegra (NOT D )  160 daily , Loratidine 10 mg , OR Zyrtec 10 mg @ bedtime  as needed for itchy eyes & sneezing.  The best exercises for the low back include freestyle swimming, stretch aerobics, and yoga.Cybex & Nautilus rather than dead weights are better for the back.     You are medically cleared for surgery.

## 2012-09-10 LAB — URINE CULTURE
Colony Count: NO GROWTH
Organism ID, Bacteria: NO GROWTH

## 2012-09-11 ENCOUNTER — Ambulatory Visit (HOSPITAL_BASED_OUTPATIENT_CLINIC_OR_DEPARTMENT_OTHER): Payer: BC Managed Care – PPO | Admitting: Genetic Counselor

## 2012-09-11 ENCOUNTER — Other Ambulatory Visit: Payer: BC Managed Care – PPO | Admitting: Lab

## 2012-09-11 ENCOUNTER — Encounter: Payer: Self-pay | Admitting: Genetic Counselor

## 2012-09-11 DIAGNOSIS — Z8041 Family history of malignant neoplasm of ovary: Secondary | ICD-10-CM

## 2012-09-11 DIAGNOSIS — IMO0002 Reserved for concepts with insufficient information to code with codable children: Secondary | ICD-10-CM

## 2012-09-11 DIAGNOSIS — Z806 Family history of leukemia: Secondary | ICD-10-CM

## 2012-09-11 DIAGNOSIS — Z807 Family history of other malignant neoplasms of lymphoid, hematopoietic and related tissues: Secondary | ICD-10-CM

## 2012-09-11 NOTE — Progress Notes (Signed)
Dr.  Reynaldo Minium requested a consultation for genetic counseling and risk assessment for Dominique Lane, a 52 y.o. female, for discussion of her family history of lymphoma, leukemia, and ovarian and bladder cancer. She presents to clinic today to discuss the possibility of a genetic predisposition to cancer, and to further clarify her risks, as well as her family members' risks for cancer.   HISTORY OF PRESENT ILLNESS: Dominique Lane is a 52 y.o. female with no personal history of cancer.    Past Medical History  Diagnosis Date  . Fibroid   . Adenomyosis   . MVP (mitral valve prolapse)     Antibiotics required  . Heart murmur   . Hyperlipidemia   . Headache     Past Surgical History  Procedure Laterality Date  . Cholecystectomy    . Ovarian cyst removal      Right  . Tubal ligation    . Ganglion cyst excision    . Rotator cuff repair      twice on left and once on right  . Vaginal hysterectomy    . Colonoscopy  2011    Dr Madilyn Fireman  . Fractured mandible  1997    repair    History  Substance Use Topics  . Smoking status: Never Smoker   . Smokeless tobacco: Never Used  . Alcohol Use: 0.0 oz/week     Comment: occasionally; < 2 glasseswine / month    REPRODUCTIVE HISTORY AND PERSONAL RISK ASSESSMENT FACTORS: Uterus Intact: Yes Ovaries Intact: Yes G2P2A0,   She has not previously undergone treatment for infertility.    FAMILY HISTORY:  We obtained a detailed, 4-generation family history.  Significant diagnoses are listed below: Family History  Problem Relation Age of Onset  . Heart disease Mother   . Hypertension Sister   . Heart disease Sister   . Cancer Sister     ? colon  . Ovarian cancer Sister 50  . Bladder Cancer Father 71  . Lymphoma Paternal Aunt   . Leukemia Paternal Grandfather   The patient has been having pelvic pain for over a year.  Her sister was recently diagnosed with ovarian cancer.  The patient's father was diagnosed with bladder cancer  at age 42.  He had two sisters and a brother.  One sister died of lymphoma in her 10s.  The patient's paternal grandfather had leukemia in his 22s.  Her paternal grandmother's mother had breast cancer and several of her father's maternal cousins had breast and/or ovarian cancer, although the patient could not tell me how they were related.  Her maternal grandfather had throat cancer.  Patient's maternal ancestors are of [place] descent, and paternal ancestors are of [place] and [place] descent. There is no reported Ashkenazi Jewish ancestry. There is no  known consanguinity.  GENETIC COUNSELING RISK ASSESSMENT, DISCUSSION, AND SUGGESTED FOLLOW UP: We reviewed the natural history and genetic etiology of sporadic, familial and hereditary cancer syndromes.  About 1 in 8 cases of ovarian cancer is hereditary.  Of this, the majority is the result of a BRCA1 or BRCA2 mutation, although some cases are also the result of mutations within the MMR genes associated with Lynch syndrome.  We reviewed the red flags of hereditary cancer syndromes and the dominant inheritance patterns.  If the BRCA and Lynch syndrome testing is negative, we discussed that we could be testing for the wrong gene.  We discussed gene panels, and that several cancer genes that are associated with different cancers can  be tested at the same time.  We discussed that testing someone with cancer is the most informative person to test for the family.  She understands this, however her sister is not willing to be tested at this time.   The patient's family history of ovarian cancer is suggestive of the following possible diagnosis: hereditary cancer syndrome  We discussed that identification of a hereditary cancer syndrome may help her care providers tailor the patients medical management. If a mutation indicating a hereditary cancer syndrome is detected in this case, the [employer] recommendations would include increased  cancer surveillance and possible prophylactic surgey. If a mutation is detected, the patient will be referred back to the referring provider and to any additional appropriate care providers to discuss the relevant options.   If a mutation is not found in the patient, cancer surveillance options would be discussed for the patient according to the appropriate standard National Comprehensive Cancer Network and American Cancer Society guidelines, with consideration of their personal and family history risk factors. In this case, the patient will be referred back to their care providers for discussions of management.   After considering the risks, benefits, and limitations, the patient provided informed consent for  the following  testing: BRCA and MyRisk through [facility].   Per the patient's request, we will contact her by telephone to discuss these results. A follow up genetic counseling visit will be scheduled if indicated.  The patient was seen for a total of 60 minutes, greater than 50% of which was spent face-to-face counseling.  This plan is being carried out per Dr. Rico Sheehan recommendations.  This note will also be sent to the referring provider via the electronic medical record. The patient will be supplied with a summary of this genetic counseling discussion as well as educational information on the discussed hereditary cancer syndromes following the conclusion of their visit.   Patient was discussed with Dr. Drue Second.  _______________________________________________________________________ For Office Staff:  Number of people involved in session: 3 Was an Intern/ student involved with case: yes }

## 2012-09-12 ENCOUNTER — Encounter (INDEPENDENT_AMBULATORY_CARE_PROVIDER_SITE_OTHER): Payer: Self-pay | Admitting: Surgery

## 2012-09-12 ENCOUNTER — Ambulatory Visit (INDEPENDENT_AMBULATORY_CARE_PROVIDER_SITE_OTHER): Payer: BC Managed Care – PPO | Admitting: Surgery

## 2012-09-12 DIAGNOSIS — R1031 Right lower quadrant pain: Secondary | ICD-10-CM

## 2012-09-12 DIAGNOSIS — G8929 Other chronic pain: Secondary | ICD-10-CM

## 2012-09-12 MED ORDER — GENTAMICIN SULFATE 40 MG/ML IJ SOLN
INTRAVENOUS | Status: AC
  Administered 2012-09-13: 100 mL via INTRAVENOUS
  Filled 2012-09-12: qty 9.51

## 2012-09-12 NOTE — Progress Notes (Signed)
NAMEDEVANN Lane       DOB: 1961/03/11           DATE: 09/12/2012       NFA:[id number]  CC:  Chief Complaint  Patient presents with  . Pre-op Exam    pre-op hernia sx 09/13/2012    HPI: this patient was seen almost a year ago with some right-sided pain for possible inguinal hernia. No hernia was found on physical CT scan did not confirm a hernia in there. She's continued to have right-sided abdominal pain and is scheduled to undergo a laparoscopy tomorrow with removal of her ovaries. She's had a prior hysterectomy. We then asked to look at the time of surgery to see if inguinal hernia can be found on the right side. Because her right-sided pain there is also some question that she could have appendiceal problems but her symptoms do not sound like appendicitis and she had a normal CT appearance the appendix last year. She's had her gallbladder previously removed. She notices a pain in the right ankle area is burning. She sometimes feels a swelling there.  EXAM: Vital signs: BP 108/62  Pulse 75  Temp(Src) 98.7 F (37.1 C) (Temporal)  Resp 18  Ht 5' 10.5" (1.791 m)  Wt 195 lb 9.6 oz (88.724 kg)  BMI 27.66 kg/m2  General: Patient alert, oriented, NAD  Abdomen: Soft and benign. I cannot appreciate a definite hernia on either side. She is a little tender in the inguinal canal. IMP: right-sided abdominal pain, currently scheduled for laparoscopy by her gynecologist.  PLAN: I told her we would be available and we'll again with him. If there is a hernia found we can do a repair but I will do an open I described that to her. I told her it was her appendix looks abnormal I will not plan to remove it.I told her that if it looks normal it is unlikely to be contributing to her symptoms and would only put her at risk for some postoperative infection.  [name] J 09/12/2012

## 2012-09-12 NOTE — Patient Instructions (Signed)
Depending on findings at surgery tomorrow we may need to repair a hernia in the right inguinal area with some mesh., If your appendix looks abnormal we can remove it, but I think that is unlikely

## 2012-09-13 ENCOUNTER — Encounter (HOSPITAL_COMMUNITY): Payer: Self-pay | Admitting: Anesthesiology

## 2012-09-13 ENCOUNTER — Ambulatory Visit (HOSPITAL_COMMUNITY)
Admission: RE | Admit: 2012-09-13 | Discharge: 2012-09-13 | Disposition: A | Discharge status: Home / Self Care | Payer: BC Managed Care – PPO | Source: Ambulatory Visit | Attending: Gynecology | Admitting: Gynecology

## 2012-09-13 ENCOUNTER — Encounter (HOSPITAL_COMMUNITY)
Admission: RE | Disposition: A | Discharge status: Home / Self Care | Payer: Self-pay | Source: Ambulatory Visit | Attending: Gynecology

## 2012-09-13 ENCOUNTER — Ambulatory Visit (HOSPITAL_COMMUNITY): Payer: BC Managed Care – PPO | Admitting: Anesthesiology

## 2012-09-13 DIAGNOSIS — N83209 Unspecified ovarian cyst, unspecified side: Secondary | ICD-10-CM

## 2012-09-13 DIAGNOSIS — R1031 Right lower quadrant pain: Secondary | ICD-10-CM | POA: Insufficient documentation

## 2012-09-13 DIAGNOSIS — Z9889 Other specified postprocedural states: Secondary | ICD-10-CM

## 2012-09-13 DIAGNOSIS — R1032 Left lower quadrant pain: Secondary | ICD-10-CM

## 2012-09-13 DIAGNOSIS — Z90710 Acquired absence of both cervix and uterus: Secondary | ICD-10-CM | POA: Insufficient documentation

## 2012-09-13 DIAGNOSIS — Z8041 Family history of malignant neoplasm of ovary: Secondary | ICD-10-CM | POA: Insufficient documentation

## 2012-09-13 HISTORY — PX: LAPAROSCOPY: SHX197

## 2012-09-13 HISTORY — PX: LAPAROSCOPIC LYSIS OF ADHESIONS: SHX5905

## 2012-09-13 HISTORY — PX: SALPINGOOPHORECTOMY: SHX82

## 2012-09-13 LAB — URINALYSIS, ROUTINE W REFLEX MICROSCOPIC
Bilirubin Urine: NEGATIVE
Glucose, UA: NEGATIVE mg/dL
Hgb urine dipstick: NEGATIVE
Specific Gravity, Urine: 1.015 (ref 1.005–1.030)

## 2012-09-13 SURGERY — LAPAROSCOPY OPERATIVE
Anesthesia: General | Site: Abdomen | Wound class: Clean Contaminated

## 2012-09-13 MED ORDER — LIDOCAINE HCL (CARDIAC) 20 MG/ML IV SOLN
INTRAVENOUS | Status: AC
  Filled 2012-09-13: qty 5

## 2012-09-13 MED ORDER — METOCLOPRAMIDE HCL 10 MG PO TABS
10.0000 mg | ORAL_TABLET | Freq: Four times a day (QID) | ORAL | Status: DC | PRN
  Filled 2012-09-13: qty 1

## 2012-09-13 MED ORDER — DEXAMETHASONE SODIUM PHOSPHATE 4 MG/ML IJ SOLN
INTRAMUSCULAR | Status: DC | PRN
  Administered 2012-09-13: 10 mg via INTRAVENOUS

## 2012-09-13 MED ORDER — NEOSTIGMINE METHYLSULFATE 1 MG/ML IJ SOLN
INTRAMUSCULAR | Status: DC | PRN
  Administered 2012-09-13: 3 mg via INTRAVENOUS

## 2012-09-13 MED ORDER — GLYCOPYRROLATE 0.2 MG/ML IJ SOLN
INTRAMUSCULAR | Status: DC | PRN
  Administered 2012-09-13: 0.2 mg via INTRAVENOUS
  Administered 2012-09-13: 0.4 mg via INTRAVENOUS

## 2012-09-13 MED ORDER — PROPOFOL 10 MG/ML IV EMUL
INTRAVENOUS | Status: DC | PRN
  Administered 2012-09-13: 200 mg via INTRAVENOUS

## 2012-09-13 MED ORDER — LACTATED RINGERS IV SOLN
INTRAVENOUS | Status: DC
  Administered 2012-09-13 (×2): via INTRAVENOUS

## 2012-09-13 MED ORDER — PROPOFOL 10 MG/ML IV EMUL
INTRAVENOUS | Status: AC
  Filled 2012-09-13: qty 20

## 2012-09-13 MED ORDER — FENTANYL CITRATE 0.05 MG/ML IJ SOLN
INTRAMUSCULAR | Status: DC | PRN
  Administered 2012-09-13: 150 ug via INTRAVENOUS
  Administered 2012-09-13: 100 ug via INTRAVENOUS

## 2012-09-13 MED ORDER — ONDANSETRON HCL 4 MG/2ML IJ SOLN
INTRAMUSCULAR | Status: DC | PRN
  Administered 2012-09-13: 4 mg via INTRAVENOUS

## 2012-09-13 MED ORDER — NEOSTIGMINE METHYLSULFATE 1 MG/ML IJ SOLN
INTRAMUSCULAR | Status: AC
  Filled 2012-09-13: qty 1

## 2012-09-13 MED ORDER — HEPARIN SODIUM (PORCINE) 5000 UNIT/ML IJ SOLN
INTRAMUSCULAR | Status: DC | PRN
  Administered 2012-09-13: 5000 [IU] via SUBCUTANEOUS

## 2012-09-13 MED ORDER — GLYCOPYRROLATE 0.2 MG/ML IJ SOLN
INTRAMUSCULAR | Status: AC
  Filled 2012-09-13: qty 3

## 2012-09-13 MED ORDER — ONDANSETRON HCL 4 MG/2ML IJ SOLN: 4.0000 mg | Freq: Once | INTRAMUSCULAR | Status: DC | PRN

## 2012-09-13 MED ORDER — FENTANYL CITRATE 0.05 MG/ML IJ SOLN
INTRAMUSCULAR | Status: AC
  Filled 2012-09-13: qty 2

## 2012-09-13 MED ORDER — ROCURONIUM BROMIDE 100 MG/10ML IV SOLN
INTRAVENOUS | Status: DC | PRN
  Administered 2012-09-13: 50 mg via INTRAVENOUS

## 2012-09-13 MED ORDER — ROCURONIUM BROMIDE 50 MG/5ML IV SOLN
INTRAVENOUS | Status: AC
  Filled 2012-09-13: qty 1

## 2012-09-13 MED ORDER — MEPERIDINE HCL 25 MG/ML IJ SOLN: 6.2500 mg | INTRAMUSCULAR | Status: DC | PRN

## 2012-09-13 MED ORDER — FENTANYL CITRATE 0.05 MG/ML IJ SOLN
25.0000 ug | INTRAMUSCULAR | Status: DC | PRN
  Administered 2012-09-13 (×3): 50 ug via INTRAVENOUS

## 2012-09-13 MED ORDER — LIDOCAINE HCL (CARDIAC) 20 MG/ML IV SOLN
INTRAVENOUS | Status: DC | PRN
  Administered 2012-09-13: 50 mg via INTRAVENOUS

## 2012-09-13 MED ORDER — DEXAMETHASONE SODIUM PHOSPHATE 10 MG/ML IJ SOLN
INTRAMUSCULAR | Status: AC
  Filled 2012-09-13: qty 1

## 2012-09-13 MED ORDER — MIDAZOLAM HCL 5 MG/5ML IJ SOLN
INTRAMUSCULAR | Status: DC | PRN
  Administered 2012-09-13: 2 mg via INTRAVENOUS

## 2012-09-13 MED ORDER — ONDANSETRON HCL 4 MG/2ML IJ SOLN
INTRAMUSCULAR | Status: AC
  Filled 2012-09-13: qty 2

## 2012-09-13 MED ORDER — LACTATED RINGERS IR SOLN
Status: DC | PRN
  Administered 2012-09-13: 3000 mL

## 2012-09-13 MED ORDER — FENTANYL CITRATE 0.05 MG/ML IJ SOLN
INTRAMUSCULAR | Status: AC
  Filled 2012-09-13: qty 5

## 2012-09-13 MED ORDER — ACETAMINOPHEN 10 MG/ML IV SOLN
1000.0000 mg | Freq: Once | INTRAVENOUS | Status: AC | PRN
  Administered 2012-09-13: 1000 mg via INTRAVENOUS

## 2012-09-13 MED ORDER — BUPIVACAINE HCL (PF) 0.25 % IJ SOLN
INTRAMUSCULAR | Status: AC
  Filled 2012-09-13: qty 30

## 2012-09-13 MED ORDER — BUPIVACAINE HCL (PF) 0.25 % IJ SOLN
INTRAMUSCULAR | Status: DC | PRN
  Administered 2012-09-13: 10 mL

## 2012-09-13 MED ORDER — ACETAMINOPHEN 10 MG/ML IV SOLN
INTRAVENOUS | Status: AC
  Filled 2012-09-13: qty 100

## 2012-09-13 MED ORDER — MIDAZOLAM HCL 2 MG/2ML IJ SOLN
INTRAMUSCULAR | Status: AC
  Filled 2012-09-13: qty 2

## 2012-09-13 MED ORDER — METHYLENE BLUE 1 % INJ SOLN
INTRAMUSCULAR | Status: AC
  Filled 2012-09-13: qty 1

## 2012-09-13 SURGICAL SUPPLY — 51 items
ADH SKN CLS APL DERMABOND .7 (GAUZE/BANDAGES/DRESSINGS) ×3
APL SKNCLS STERI-STRIP NONHPOA (GAUZE/BANDAGES/DRESSINGS)
BAG SPEC RTRVL LRG 6X4 10 (ENDOMECHANICALS)
BARRIER ADHS 3X4 INTERCEED (GAUZE/BANDAGES/DRESSINGS) IMPLANT
BENZOIN TINCTURE PRP APPL 2/3 (GAUZE/BANDAGES/DRESSINGS) IMPLANT
BRR ADH 4X3 ABS CNTRL BYND (GAUZE/BANDAGES/DRESSINGS)
CABLE HIGH FREQUENCY MONO STRZ (ELECTRODE) IMPLANT
CLOTH BEACON ORANGE TIMEOUT ST (SAFETY) ×4 IMPLANT
COVER MAYO STAND STRL (DRAPES) ×4 IMPLANT
DECANTER SPIKE VIAL GLASS SM (MISCELLANEOUS) ×4 IMPLANT
DERMABOND ADVANCED (GAUZE/BANDAGES/DRESSINGS) ×1
DERMABOND ADVANCED .7 DNX12 (GAUZE/BANDAGES/DRESSINGS) ×1 IMPLANT
FILTER SMOKE EVAC LAPAROSHD (FILTER) ×4 IMPLANT
GLOVE BIO SURGEON STRL SZ7 (GLOVE) ×4 IMPLANT
GLOVE BIOGEL PI IND STRL 8 (GLOVE) ×3 IMPLANT
GLOVE BIOGEL PI INDICATOR 8 (GLOVE) ×1
GLOVE ECLIPSE 7.5 STRL STRAW (GLOVE) ×8 IMPLANT
GOWN PREVENTION PLUS LG XLONG (DISPOSABLE) ×12 IMPLANT
GOWN STRL REIN XL XLG (GOWN DISPOSABLE) ×12 IMPLANT
NEEDLE HYPO 22GX1.5 SAFETY (NEEDLE) ×4 IMPLANT
NS IRRIG 1000ML POUR BTL (IV SOLUTION) ×8 IMPLANT
PACK ABDOMINAL GYN (CUSTOM PROCEDURE TRAY) IMPLANT
PACK LAPAROSCOPY BASIN (CUSTOM PROCEDURE TRAY) ×4 IMPLANT
POUCH SPECIMEN RETRIEVAL 10MM (ENDOMECHANICALS) IMPLANT
PROTECTOR NERVE ULNAR (MISCELLANEOUS) ×4 IMPLANT
SCALPEL HARMONIC ACE (MISCELLANEOUS) ×2 IMPLANT
SET IRRIG TUBING LAPAROSCOPIC (IRRIGATION / IRRIGATOR) ×4 IMPLANT
SOLUTION ELECTROLUBE (MISCELLANEOUS) IMPLANT
SPONGE GAUZE 4X4 12PLY (GAUZE/BANDAGES/DRESSINGS) IMPLANT
SPONGE LAP 4X18 X RAY DECT (DISPOSABLE) IMPLANT
STRIP CLOSURE SKIN 1/2X4 (GAUZE/BANDAGES/DRESSINGS) IMPLANT
SUT MNCRL AB 4-0 PS2 18 (SUTURE) ×8 IMPLANT
SUT PROLENE 0 CT 1 CR/8 (SUTURE) IMPLANT
SUT PROLENE 1 CT 1 30 (SUTURE) IMPLANT
SUT PROLENE 2 0 CT2 30 (SUTURE) IMPLANT
SUT SILK 2 0 SH (SUTURE) IMPLANT
SUT SILK 3 0 SH 30 (SUTURE) IMPLANT
SUT VIC AB 3-0 CT1 27 (SUTURE)
SUT VIC AB 3-0 CT1 TAPERPNT 27 (SUTURE) IMPLANT
SUT VIC AB 3-0 PS2 18 (SUTURE) ×4
SUT VIC AB 3-0 PS2 18XBRD (SUTURE) ×3 IMPLANT
SUT VIC AB 3-0 SH 18 (SUTURE) IMPLANT
SUT VICRYL 0 UR6 27IN ABS (SUTURE) ×4 IMPLANT
SYR CONTROL 10ML LL (SYRINGE) ×4 IMPLANT
TOWEL OR 17X24 6PK STRL BLUE (TOWEL DISPOSABLE) ×16 IMPLANT
TRAY FOLEY CATH 14FR (SET/KITS/TRAYS/PACK) ×4 IMPLANT
TROCAR XCEL NON-BLD 11X100MML (ENDOMECHANICALS) ×4 IMPLANT
TROCAR XCEL NON-BLD 5MMX100MML (ENDOMECHANICALS) ×4 IMPLANT
TROCAR XCEL OPT SLVE 5M 100M (ENDOMECHANICALS) ×4 IMPLANT
WARMER LAPAROSCOPE (MISCELLANEOUS) ×4 IMPLANT
WATER STERILE IRR 1000ML POUR (IV SOLUTION) ×4 IMPLANT

## 2012-09-13 NOTE — OR Nursing (Signed)
Late entry:  Lysis of adhesions added to procedure.

## 2012-09-13 NOTE — Anesthesia Postprocedure Evaluation (Signed)
  Anesthesia Post-op Note  Anesthesia Post Note  Patient: Dominique Lane  Procedure(s) Performed: Procedure(s) (LRB): LAPAROSCOPY OPERATIVE (N/A) SALPINGO OOPHORECTOMY (Bilateral) LAPAROSCOPIC LYSIS OF ADHESIONS (N/A)  Anesthesia type: General  Patient location: PACU  Post pain: Pain level controlled  Post assessment: Post-op Vital signs reviewed  Last Vitals:  Filed Vitals:   09/13/12 1045  BP: 107/68  Pulse: 60  Temp:   Resp: 11    Post vital signs: Reviewed  Level of consciousness: sedated  Complications: No apparent anesthesia complications

## 2012-09-13 NOTE — H&P (View-Only) (Signed)
Dominique Lane is an 52 y.o. female. For her preoperative consultation. Patient scheduled for laparoscopic bilateral salpingo-oophorectomy because of chronic right lower quadrant pain and strong family history of ovarian cancer.Patient is menopausal. We also had requested for general surgery consultation intraoperatively to rule out the possibility of right inguinal or femoral hernia and possibly concurrently proceed with an incidental appendectomy as well. Her history has been as follows:  She has previously had a negative ultrasound and CT scan. She saw Dominique Lane who did not think she had a gastrointestinal problem. We discussed the possibility of endometriosis. We sent her to see Dr. Logan Bores who injected her in late October and since he did that her pain is much better and no longer radiates to her leg. She had seen Dr. Wenda Low who was skeptical that she had a inguinal hernia although her exam was somewhat suggestive. What has happened since then is that her sister who is our  patientBrent Lane) was diagnosed with stage III ovarian cancer after a negative pelvic ultrasound and a CT scan showing normal ovaries but abnormal omentum. The patient is concerned that she may also have ovarian cancer in spite of her negative ultrasound and CT scan. The other issue is whether she should have preventative laparoscopic BSO."  Patient was also referred to Dr. Kerri Perches GYN oncologist who had done her sister surgery for his opinion. Dr. Eda Paschal offered laparoscopy with bilateral salpingo-oophorectomy which I concur. Patient has not seen Dr. Kerri Perches yet. She stated that her sister who had the ovarian cancer has refused to have the BRCA one BRCA2 testing but that she had gone with her other sisters and she is leaning on going back within the next week or so to have the blood tests drawn for herself. She states and not only her sister both several aunts had ovarian cancer and a grandmother  with breast cancer.   Patient had an ultrasound in our office on March 6 with the following result: Patient with previous hysterectomy. Right ovary seen with echo-free cyst measuring 11 x 6 x 8 mm. Left ovary seen with tiny cystic area positive color flow to the ovary. Negative fluid in the cul-de-sac. No apparent masses seen on either the right or left adnexa.  Patient does lifting at work and she states that this aggravates her pain.    Pertinent Gynecological History: Menses: Post-hysterectomy Bleeding: post-hysterectomy Contraception: none DES exposure: denies Blood transfusions: none Sexually transmitted diseases: no past history Previous GYN Procedures: Vaginal hysterectomy with ovarian cystectomy  Last mammogram: 2006? Date: 2006? Last pap: normal Date: 2011 OB History: G2, P2   Menstrual History: Menarche age: 78  No LMP recorded. Patient has had a hysterectomy.    Past Medical History  Diagnosis Date  . Fibroid   . Adenomyosis   . MVP (mitral valve prolapse)     Antibiotics required  . Heart murmur   . Hyperlipidemia   . Headache     Past Surgical History  Procedure Laterality Date  . Cholecystectomy    . Ovarian cyst removal      Right  . Tubal ligation    . Ganglion cyst excision    . Rotator cuff repair      twice on left and once on right  . Vaginal hysterectomy    . Colonoscopy  2011    Dr Madilyn Fireman  . Fractured mandible  89    repair    Family History  Problem Relation Age  of Onset  . Heart disease Mother   . Hypertension Sister   . Heart disease Sister   . Cancer Sister     ? colon  . Cancer Sister 33    OVARIAN    Social History:  reports that she has never smoked. She has never used smokeless tobacco. She reports that she drinks about 0.5 ounces of alcohol per week. She reports that she does not use illicit drugs.  Allergies:  Allergies  Allergen Reactions  . Cephalosporins Hives    All over the body.  . Naproxen Sodium Anaphylaxis      (Not in a hospital admission)  REVIEW OF SYSTEMS: A ROS was performed and pertinent positives and negatives are included in the history.  GENERAL: No fevers or chills. HEENT: No change in vision, no earache, sore throat or sinus congestion. NECK: No pain or stiffness. CARDIOVASCULAR: No chest pain or pressure. No palpitations. PULMONARY: No shortness of breath, cough or wheeze. GASTROINTESTINAL: No abdominal pain, nausea, vomiting or diarrhea, melena or bright red blood per rectum. GENITOURINARY: No urinary frequency, urgency, hesitancy or dysuria. MUSCULOSKELETAL: No joint or muscle pain, no back pain, no recent trauma. DERMATOLOGIC: No rash, no itching, no lesions. ENDOCRINE: No polyuria, polydipsia, no heat or cold intolerance. No recent change in weight. HEMATOLOGICAL: No anemia or easy bruising or bleeding. NEUROLOGIC: No headache, seizures, numbness, tingling or weakness. PSYCHIATRIC: No depression, no loss of interest in normal activity or change in sleep pattern.   Right lower quadrant pain more so towards the inguinal ring with radiation towards her leg  Blood pressure 122/80.  Physical Exam:  HEENT:unremarkable Neck:Supple, midline, no thyroid megaly, no carotid bruits Lungs:  Clear to auscultation no rhonchi's or wheezes Heart:Regular rate and rhythm, no murmurs or gallops Breast Exam:not examined Abdomen:soft tender right lower quadrant but no rebound or guarding mostly towards the right inguinal ring Pelvic:BUSwithin normal limits Vagina: intact vaginal cuff, slight tenderness Cervix:absent Uterus:absent Adnexa:tenderness but mostly towards the inguinal ring area Extremities: No cords, no edema Rectal:Patient was examined in the standing position and she was very tender over the inguinal ring region although no true discernible hernia was palpated.   Results for orders placed during the hospital encounter of 09/06/12 (from the past 24 hour(s))  BASIC METABOLIC PANEL      Status: Abnormal   Collection Time    09/06/12  2:00 PM      Result Value Range   Sodium 140  135 - 145 mEq/L   Potassium 4.3  3.5 - 5.1 mEq/L   Chloride 105  96 - 112 mEq/L   CO2 27  19 - 32 mEq/L   Glucose, Bld 91  70 - 99 mg/dL   BUN 15  6 - 23 mg/dL   Creatinine, Ser 1.61  0.50 - 1.10 mg/dL   Calcium 9.6  8.4 - 09.6 mg/dL   GFR calc non Af Amer 75 (*) >90 mL/min   GFR calc Af Amer 87 (*) >90 mL/min  CBC     Status: None   Collection Time    09/06/12  2:00 PM      Result Value Range   WBC 6.1  4.0 - 10.5 K/uL   RBC 4.04  3.87 - 5.11 MIL/uL   Hemoglobin 12.1  12.0 - 15.0 g/dL   HCT 04.5  40.9 - 81.1 %   MCV 90.6  78.0 - 100.0 fL   MCH 30.0  26.0 - 34.0 pg   MCHC 33.1  30.0 - 36.0 g/dL   RDW 14.7  82.9 - 56.2 %   Platelets 196  150 - 400 K/uL  SURGICAL PCR SCREEN     Status: Abnormal   Collection Time    09/06/12  2:03 PM      Result Value Range   MRSA, PCR NEGATIVE  NEGATIVE   Staphylococcus aureus POSITIVE (*) NEGATIVE    No results found.  AssPatient with chronic right lower quadrant pain. Patient with past history of endometriosis. Patient with family history of ovarian cancer in her sister. Questionable inguinal hernia although evaluated by general surgeon who did not feel that it was a hernia. Her CT scan (May 2013) also was normal. Nevertheless she is very tender over the inguinal region right lower quadrant area with some radiculopathy. Patient with some response from local injection by urologist at [facility] Dr. Logan Bores. But her symptoms are still present. We had offered her a shot of Depo-Provera 150 mg IM today but she refused. She was taken out of work and stated that her pain has decreased she is not doing any lifting at work. She had been given prescription of Vicodin to take 1 by mouth every 6 hours as needed as well as Reglan 10 mg every 6 hours as needed. Patient on comprehensive metabolic panel recently was noted to have slightly decreased glomerular  filtration rate and will see Dr. Marga Melnick for medical clearance tomorrow. She scheduled to see the geneticist on Monday because of her family history of ovarian and breast cancer. Patient has nevertheless decided to proceed with bilateral salpingo-oophorectomy regardless of findings during surgery. She scheduled to see Dr. Daisy Floro general surgeon next week in consultation as well. We appreciate him being here during her surgery to assess her inguinal  ring in the event that she may need a hernia repair. I will speak to him to see if he would proceed with doing an incidental laparoscopic appendectomy at the same time. I did discuss with the patient that maybe the possibility that she has underlying endometriosis as well. The following risk of surgery were discussed with the patient:  Patient was counseled as to the risk of surgery to include the following:  1. Infection (prohylactic antibiotics will be administered)  2. DVT/Pulmonary Embolism (prophylactic pneumo compression stockings will be used)  3.Trauma to internal organs requiring additional surgical procedure to repair any injury to  Internal organs requiring perhaps additional hospitalization days.  4.Hemmorhage requiring transfusion and blood products which carry risks such as anaphylactic reaction, hepatitis and AIDS  Patient had received literature information on the procedure scheduled and all her questions were answered and fully accepts all risk.. The general surgeons we'll discuss the risk firm there procedure.    Nafisa Olds H 09/07/2012, 1:41 PM

## 2012-09-13 NOTE — Anesthesia Preprocedure Evaluation (Signed)
Anesthesia Evaluation  Patient identified by MRN, date of birth, ID band Patient awake    Reviewed: Allergy & Precautions, H&P , NPO status , Patient's Chart, lab work & pertinent test results  Airway Mallampati: I TM Distance: >3 FB Neck ROM: full    Dental no notable dental hx. (+) Teeth Intact   Pulmonary neg pulmonary ROS,    Pulmonary exam normal       Cardiovascular negative cardio ROS      Neuro/Psych negative psych ROS   GI/Hepatic negative GI ROS, Neg liver ROS,   Endo/Other  negative endocrine ROS  Renal/GU negative Renal ROS  negative genitourinary   Musculoskeletal negative musculoskeletal ROS (+)   Abdominal Normal abdominal exam  (+)   Peds negative pediatric ROS (+)  Hematology negative hematology ROS (+)   Anesthesia Other Findings   Reproductive/Obstetrics negative OB ROS                           Anesthesia Physical Anesthesia Plan  ASA: I  Anesthesia Plan: General   Post-op Pain Management:    Induction: Intravenous  Airway Management Planned: Oral ETT  Additional Equipment:   Intra-op Plan:   Post-operative Plan:   Informed Consent: I have reviewed the patients History and Physical, chart, labs and discussed the procedure including the risks, benefits and alternatives for the proposed anesthesia with the patient or authorized representative who has indicated his/her understanding and acceptance.   Dental Advisory Given  Plan Discussed with: CRNA and Surgeon  Anesthesia Plan Comments:         Anesthesia Quick Evaluation

## 2012-09-13 NOTE — Op Note (Signed)
09/13/2012  9:37 AM  PATIENT:  Dominique Lane  52 y.o. female  PRE-OPERATIVE DIAGNOSIS:  chronic right quadrant pain, family history of ovarian cancer  POST-OPERATIVE DIAGNOSIS:  chronic right lower quadrant pain, family history of ovarian cancer  PROCEDURE:  Procedure(s): LAPAROSCOPY OPERATIVE SALPINGO OOPHORECTOMY  Intraoperative consultation Dr. Currie Paris general surgeon  SURGEON:  Surgeon(s): [name], MD Dara Lords, MD Currie Paris, MD  ANESTHESIA:   general  FINDINGS: Evidence of previous hysterectomy. Sigmoid colon/omentum adhered to the vaginal cuff. Right fallopian tube normal appearance right ovary small 2 cm clear cyst. Small Prolene suture from previous surgery noted on right ovary. Left fallopian tube and ovary normal. Both inguinal rings were without defect and appendix appeared to be normal. Smooth liver surface. Omentum with no seeding noted. General surgeon Dr. Cyndia Bent was present during the inspection of the inguinal ring and appendix and liver surface. (See separate dictation)    DESCRIPTION OF OPERATION:The patient was taken to the operating room where she underwent successful general endotracheal anesthesia. Time out was undertaken to identify the patient, position, and procedure to be undertaken. Due to patient's allergy she received gentamicin and clindamycin for prophylaxis. For DVT prophylaxis she had PAS stockings. The abdomen vagina and perineum were prepped and draped in usual sterile fashion and a Foley catheter was inserted to monitor urinary output. A small some umbilicus incision was made followed by insertion of a 10/11 mm Optiview trocar. A pneumoperitoneum was established with approximately 2 L of carbon dioxide. 2 separate 5 mm trochars were inserted into the right and left lower abdomen under laparoscopic guidance. Systematic inspection with the following:    Evidence of previous hysterectomy. Sigmoid  colon/omentum adhered to the vaginal cuff. Right fallopian tube normal appearance right ovary small 2 cm clear cyst. Small Prolene suture from previous surgery noted on right ovary. Left fallopian tube and ovary normal. Both inguinal rings were without defect and appendix appeared to be normal. Smooth liver surface. Omentum with no seeding noted.  Meticulous dissection was required to free the omentum from the vaginal cuff where previous hysterectomy had been undertaken. The Harmonic Scalpel was utilized to coaptated and transected the adhesion to free the sigmoid colon and omentum from the vaginal cuff. The right tube and ovary was then identified and placed under tension. The right ureter was seen distally from the area that the right infundibulopelvic ligament was to be transected. The right infundibulopelvic ligament close to the ovary was coaptated and transected with the harmonic scalpel as well as the remaining adhesion and to free the right tube and ovary completely. Similar procedure was undertaken on the contralateral side. Of note pelvic washings had previously been obtained to be submitted for cytology. A small cuff adhesion was noted which was removed and submitted separately for histological evaluation. Both right tube and ovary were placed on a laparoscopic Endo pouch and retrieved through the umbilicus incision and submitted separately for histological evaluation. The pelvic cavity was then copiously irrigated with normal saline solution. Interceed was placed at the vaginal cuff to prevent future adhesion. The pneumoperitoneum was removed. All laparoscopic instruments were then removed. The subumbilical fascia was closed with a figure-of-eight of 0 Vicryl suture and the subcutaneous tissue was reapproximated with 3-0 Vicryl suture. The skin edges of the subumbilical incision and both lower right and left ports were reapproximated with Dermabond glue. For postoperative analgesia 10 cc of 0.25  percent Marcaine was infiltrated. The Foley catheter was removed  Band-Aids were placed on all 3 incisions ports. Patient was transferred to recovery stable vital signs and blood loss was minimal.  ESTIMATED BLOOD LOSS: Minimal  Intake/Output Summary (Last 24 hours) at 09/13/12 1610 Last data filed at 09/13/12 0915  Gross per 24 hour  Intake   1000 ml  Output     55 ml  Net    945 ml     BLOOD ADMINISTERED:none   LOCAL MEDICATIONS USED:  MARCAINE   0.25% subcutaneous port sites total 10 cc  SPECIMEN:  Source of Specimen:  Pelvic washings, right and left fallopian tube and ovary. Pelvic adhesion  DISPOSITION OF SPECIMEN:  PATHOLOGY  COUNTS:  YES  PLAN OF CARE: Transfer to PACU  [facility] HMD9:37 AMTD@

## 2012-09-13 NOTE — Op Note (Signed)
INTRAOPERATIVE CONSULTATION  This patient is undergoing laparoscopic bilateral tubal oophorectomy. There has been a question of whether or not she has a right inguinal hernia. In addition she has been having some right-sided pain and I was asked to look to see if I saw any other potential source. She was examined by me yesterday in the office and no inguinal hernia was noted on physical examination. A CT scan a year ago did not show or hernia.  After satisfactory general endotracheal anesthesia was obtained and the abdomen prepped and draped Dr. Lily Peer placed the trochars and camera. I was able to visualize easily the intra-abdominal structures. There is no evidence of any inflammatory process. The appendix was completely normal. No significant intra-abdominal adhesions although there were a few adhesions of the sigmoid colon in the pelvis from her prior vaginal hysterectomy. Small bowel appeared normal although we did not do a formal examination of the entire small bowel there is no dilatation and no evidence of any inflammatory processes. The omentum looked normal. There is no evidence of an inguinal hernia on either side.  After my observations were done Dr. Lily Peer completed his procedure.

## 2012-09-13 NOTE — Transfer of Care (Signed)
Immediate Anesthesia Transfer of Care Note  Patient: Dominique Lane  Procedure(s) Performed: Procedure(s): LAPAROSCOPY OPERATIVE (N/A) SALPINGO OOPHORECTOMY (Bilateral)  Patient Location: PACU  Anesthesia Type:General  Level of Consciousness: awake, alert  and oriented  Airway & Oxygen Therapy: Patient Spontanous Breathing and Patient connected to nasal cannula oxygen  Post-op Assessment: Report given to PACU RN and Post -op Vital signs reviewed and stable  Post vital signs: Reviewed and stable  Complications: No apparent anesthesia complications

## 2012-09-13 NOTE — Interval H&P Note (Signed)
History and Physical Interval Note:  09/13/2012 8:13 AM  Dominique Lane  has presented today for surgery, with the diagnosis of chronic Left lower quadrant pain, family history of ovarian cancer  The various methods of treatment have been discussed with the patient and family. After consideration of risks, benefits and other options for treatment, the patient has consented to  Procedure(s): LAPAROSCOPY OPERATIVE (N/A) SALPINGO OOPHORECTOMY (Bilateral) HERNIA REPAIR INGUINAL ADULT (N/A) as a surgical intervention .  The patient's history has been reviewed, patient examined, no change in status, stable for surgery.  I have reviewed the patient's chart and labs.  Questions were answered to the patient's satisfaction.     Dominique Lane

## 2012-09-14 ENCOUNTER — Telehealth: Payer: Self-pay | Admitting: *Deleted

## 2012-09-14 ENCOUNTER — Encounter (HOSPITAL_COMMUNITY): Payer: Self-pay | Admitting: Gynecology

## 2012-09-14 MED FILL — Heparin Sodium (Porcine) Inj 5000 Unit/ML: INTRAMUSCULAR | Qty: 1 | Status: AC

## 2012-09-14 NOTE — Telephone Encounter (Signed)
Pt had surgery for laparoscopic bilateral tubal oophorectomy on 09/13/12. Pt complained of some bruises at incision site, I advised pt this is normal. No pain at the current time. Pt will follow up as needed.

## 2012-09-19 ENCOUNTER — Telehealth: Payer: Self-pay | Admitting: Genetic Counselor

## 2012-09-19 NOTE — Telephone Encounter (Signed)
She will try to come in tomorrow, Wed, to sign her test requisition.

## 2012-09-28 ENCOUNTER — Encounter: Payer: Self-pay | Admitting: Gynecology

## 2012-09-28 ENCOUNTER — Ambulatory Visit (INDEPENDENT_AMBULATORY_CARE_PROVIDER_SITE_OTHER): Payer: BC Managed Care – PPO | Admitting: Gynecology

## 2012-09-28 DIAGNOSIS — Z09 Encounter for follow-up examination after completed treatment for conditions other than malignant neoplasm: Secondary | ICD-10-CM

## 2012-09-28 NOTE — Progress Notes (Signed)
Patient presented to the office for her two-week postop visit patient status post diagnostic laparoscopy with bilateral salpingo-oophorectomy as well as intraoperative general surgical consultation Cyndia Bent MD) as a result of chronic right lower quadrant pain and family history of ovarian cancer.   Findings from surgery as follows: Evidence of previous hysterectomy. Sigmoid colon/omentum adhered to the vaginal cuff. Right fallopian tube normal appearance right ovary small 2 cm clear cyst. Small Prolene suture from previous surgery noted on right ovary. Left fallopian tube and ovary normal. Both inguinal rings were without defect and appendix appeared to be normal. Smooth liver surface. Omentum with no seeding noted. General surgeon Dr. Cyndia Bent was present during the inspection of the inguinal ring and appendix and liver surface.   Pathology report as follows: Ovary and fallopian tube, right - BENIGN OVARY WITH FOLLICULAR TYPE CYST; NEGATIVE FOR ATYPIA OR MALIGNANCY. - BENIGN FALLOPIAN TUBE WITH SEROUS TYPE PARATUBAL CYST; NEGATIVE FOR ATYPIA OR MALIGNANCY. 2. Ovary and fallopian tube, left - BENIGN OVARY WITH FOLLICULAR TYPE CYST; NEGATIVE FOR ATYPIA OR MALIGNANCY. - BENIGN FALLOPIAN TUBE; NEGATIVE FOR ATYPIA OR MALIGNANCY. 3. Peritoneum, biopsy - BENIGN FAT WITH HEMORRHAGE SEE COMMENT. Microscopic Comment 3. Associated with benign fat are fragments of benign smooth muscle. Morphologically this favors sampling of peritoneal smooth muscle and not a neoplastic proliferation (i.e. Leiomyoma).  Patient is doing well she states that she has not felt so good is such a long time that she noticed no difference since her surgery.  Exam: Abdomen soft nontender no rebound or guarding. All 3 port sites healing well. Pelvic exam: Bartholin urethra Skene was within normal limits Vagina: No lesions or discharge Vaginal cuff intact Bimanual exam no palpable masses or tenderness Rectal  exam not done  Assessment/plan: Patient status post laparoscopic bilateral salpingo-oophorectomy 2 weeks ago doing well. Patient will return back to the office in 2 weeks for final postop visit and then released back to work.

## 2012-10-06 ENCOUNTER — Telehealth: Payer: Self-pay

## 2012-10-06 NOTE — Telephone Encounter (Signed)
Patient states she has spoken with disability company and they received the form I faxed but now say they need her notes as well.  Office notes and op note were faxed this morning. Pt informed.

## 2012-10-10 ENCOUNTER — Encounter: Payer: Self-pay | Admitting: Gynecology

## 2012-10-11 ENCOUNTER — Telehealth: Payer: Self-pay | Admitting: Genetic Counselor

## 2012-10-11 NOTE — Telephone Encounter (Signed)
Revealed negative test results with a PALB2

## 2012-10-17 ENCOUNTER — Encounter: Payer: Self-pay | Admitting: Gynecology

## 2012-10-17 ENCOUNTER — Ambulatory Visit (INDEPENDENT_AMBULATORY_CARE_PROVIDER_SITE_OTHER): Payer: BC Managed Care – PPO | Admitting: Gynecology

## 2012-10-17 VITALS — BP 124/88

## 2012-10-17 DIAGNOSIS — Z9889 Other specified postprocedural states: Secondary | ICD-10-CM

## 2012-10-17 NOTE — Progress Notes (Signed)
Patient presented to the office for her final postop visit. Patient is status post diagnostic laparoscopy with bilateral salpingo-oophorectomy as well as intraoperative general surgical consultation Cyndia Bent MD) as a result of chronic right lower quadrant pain and family history of ovarian cancer.   Findings from surgery as follows:  Evidence of previous hysterectomy. Sigmoid colon/omentum adhered to the vaginal cuff. Right fallopian tube normal appearance right ovary small 2 cm clear cyst. Small Prolene suture from previous surgery noted on right ovary. Left fallopian tube and ovary normal. Both inguinal rings were without defect and appendix appeared to be normal. Smooth liver surface. Omentum with no seeding noted. General surgeon Dr. Cyndia Bent was present during the inspection of the inguinal ring and appendix and liver surface.   Pathology report as follows:  Ovary and fallopian tube, right  - BENIGN OVARY WITH FOLLICULAR TYPE CYST; NEGATIVE FOR ATYPIA OR MALIGNANCY.  - BENIGN FALLOPIAN TUBE WITH SEROUS TYPE PARATUBAL CYST; NEGATIVE FOR ATYPIA OR  MALIGNANCY.  2. Ovary and fallopian tube, left  - BENIGN OVARY WITH FOLLICULAR TYPE CYST; NEGATIVE FOR ATYPIA OR MALIGNANCY.  - BENIGN FALLOPIAN TUBE; NEGATIVE FOR ATYPIA OR MALIGNANCY.  3. Peritoneum, biopsy  - BENIGN FAT WITH HEMORRHAGE SEE COMMENT.  Microscopic Comment  3. Associated with benign fat are fragments of benign smooth muscle. Morphologically this favors sampling of  peritoneal smooth muscle and not a neoplastic proliferation (i.e. Leiomyoma  Patient is doing well and states that she has not felt this good in such a long time.  She stated that her sister who had the ovarian cancer has refused to have the BRCA one BRCA2 testing . She states and not only her sister both several aunts had ovarian cancer and a grandmother with breast cancer. Patient herself had the BRCA one BRCA2 testing on April 11 results as  follows:  Negative-no clinical actual mutation identified  Exam: Abdomen: Incision ports completely healed. Abdomen soft nontender no rebound or guarding Pelvic: Bartholin urethra Skene was within normal limits Vagina: Vaginal cuff intact bimanual exam no palpable masses or tenderness Rectal exam: Not done  Assessment: Patient for a half weeks status post laparoscopic bilateral salpingo-oophorectomy. See pathology results above. Patient doing well. Patient to return to work next week with no restriction. 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 Patient stated her mammogram was done recently which was normal. Her colonoscopy was 3 years ago which was negative. We discussed importance of calcium vitamin D for osteoporosis prevention along with weightbearing exercises on a regular basis.

## 2012-10-18 ENCOUNTER — Encounter: Payer: Self-pay | Admitting: Genetic Counselor

## 2012-10-19 ENCOUNTER — Encounter (INDEPENDENT_AMBULATORY_CARE_PROVIDER_SITE_OTHER): Payer: BC Managed Care – PPO | Admitting: Surgery

## 2012-10-27 ENCOUNTER — Encounter: Payer: Self-pay | Admitting: Gynecology

## 2013-03-19 ENCOUNTER — Telehealth: Payer: Self-pay | Admitting: *Deleted

## 2013-03-19 NOTE — Telephone Encounter (Signed)
Pt called and left message about bone density study? I left message for pt to call.

## 2013-04-23 ENCOUNTER — Encounter: Payer: BC Managed Care – PPO | Admitting: Internal Medicine

## 2013-05-03 ENCOUNTER — Other Ambulatory Visit: Payer: Self-pay

## 2013-05-16 ENCOUNTER — Telehealth: Payer: Self-pay | Admitting: *Deleted

## 2013-05-16 NOTE — Telephone Encounter (Signed)
Dominique Lane will call patient, order placed

## 2013-05-16 NOTE — Telephone Encounter (Signed)
Pt called her insurance company and was told her last dexa was 04/19/06 pt has met her deductible for this year. She would like to scheduled this year if okay? OV 10/17/12 note states "return back next year for her annual exam or by she will be due for her bone density study". Please advise

## 2013-05-16 NOTE — Telephone Encounter (Signed)
please go ahead and schedule her bone density

## 2013-05-21 ENCOUNTER — Telehealth: Payer: Self-pay | Admitting: *Deleted

## 2013-05-21 NOTE — Telephone Encounter (Signed)
Please inform her that she has surgical menopause her ovaries were removed April of this year. We could wait until next year to year which would be one year after her surgery.

## 2013-05-21 NOTE — Telephone Encounter (Signed)
Pt is scheduled for bone density on 05/29/13, pt was informed by her insurance company that her dexa may not be paid for due to her age. Pt asked why is she getting dexa done at age 52? She is willing to have this done if needed, but if insurance is not going to pay it maybe a issue. Please advise

## 2013-05-23 ENCOUNTER — Other Ambulatory Visit: Payer: Self-pay | Admitting: Gynecology

## 2013-05-23 DIAGNOSIS — Z90722 Acquired absence of ovaries, bilateral: Secondary | ICD-10-CM

## 2013-05-23 DIAGNOSIS — Z780 Asymptomatic menopausal state: Secondary | ICD-10-CM

## 2013-05-23 DIAGNOSIS — Z13820 Encounter for screening for osteoporosis: Secondary | ICD-10-CM

## 2013-05-28 NOTE — Telephone Encounter (Signed)
Pt informed by Lupita Leash

## 2013-06-10 ENCOUNTER — Ambulatory Visit: Payer: BC Managed Care – PPO | Admitting: Emergency Medicine

## 2013-06-10 VITALS — BP 108/70 | HR 84 | Temp 98.3°F | Resp 16 | Ht 70.0 in | Wt 192.0 lb

## 2013-06-10 DIAGNOSIS — J209 Acute bronchitis, unspecified: Secondary | ICD-10-CM

## 2013-06-10 MED ORDER — AZITHROMYCIN 250 MG PO TABS: ORAL_TABLET | ORAL | Status: DC

## 2013-06-10 MED ORDER — HYDROCOD POLST-CHLORPHEN POLST 10-8 MG/5ML PO LQCR: 5.0000 mL | Freq: Two times a day (BID) | ORAL | Status: DC | PRN

## 2013-06-10 NOTE — Progress Notes (Signed)
[facility] [address] [place] [address] [phone]  Date:  06/10/2013   Name:  Dominique Lane   DOB:  07/24/1960   MRN:  [id number]  PCP:  Marga Melnick, MD    Chief Complaint: Cough, Shortness of Breath and Wheezing   History of Present Illness:  Dominique Lane is a 52 y.o. very pleasant female patient who presents with the following:  Ill for one week with nasal congestion and cough. Now coughing up mucopurulent sputum.  Wheezing started yesterday.  Cough worse when lays down.  No nausea or vomiting.  No stool change or rash.  Low grade intermittent fever.  No improvement with over the counter medications or other home remedies. Denies other complaint or health concern today.   Patient Active Problem List   Diagnosis Date Noted  . MRSA carrier 09/08/2012  . Endometriosis 08/23/2012  . Family history of ovarian cancer 08/23/2012  . MVP (mitral valve prolapse)   . HYPERLIPIDEMIA 04/10/2007  . GERD 04/10/2007    Past Medical History  Diagnosis Date  . Fibroid   . Adenomyosis   . MVP (mitral valve prolapse)     Antibiotics required  . Heart murmur   . Hyperlipidemia   . GNFAOZHY(865.7)     Past Surgical History  Procedure Laterality Date  . Cholecystectomy    . Ovarian cyst removal      Right  . Tubal ligation    . Ganglion cyst excision    . Rotator cuff repair      twice on left and once on right  . Vaginal hysterectomy    . Colonoscopy  2011    Dr Madilyn Fireman  . Fractured mandible  1997    repair  . Laparoscopy N/A 09/13/2012    Procedure: LAPAROSCOPY OPERATIVE;  Surgeon: [name], MD;  Location: [facility];  Service: Gynecology;  Laterality: N/A;  . Salpingoophorectomy Bilateral 09/13/2012    Procedure: SALPINGO OOPHORECTOMY;  Surgeon: [name], MD;  Location: [facility];  Service: Gynecology;  Laterality: Bilateral;  . Laparoscopic lysis of adhesions N/A 09/13/2012    Procedure: LAPAROSCOPIC LYSIS OF ADHESIONS;   Surgeon: [name], MD;  Location: [facility];  Service: Gynecology;  Laterality: N/A;    History  Substance Use Topics  . Smoking status: Never Smoker   . Smokeless tobacco: Never Used  . Alcohol Use: 0.0 oz/week     Comment: occasionally; < 2 glasseswine / month    Family History  Problem Relation Age of Onset  . Heart disease Mother   . Hypertension Sister   . Heart disease Sister   . Cancer Sister     ? colon  . Ovarian cancer Sister 59  . Bladder Cancer Father 76  . Lymphoma Paternal Aunt   . Leukemia Paternal Grandfather     Allergies  Allergen Reactions  . Cephalosporins Hives    All over the body.  . Naproxen Sodium Anaphylaxis    Medication list has been reviewed and updated.  No current outpatient prescriptions on file prior to visit.   No current facility-administered medications on file prior to visit.    Review of Systems:  As per HPI, otherwise negative.    Physical Examination: Filed Vitals:   06/10/13 1219  BP: 108/70  Pulse: 84  Temp: 98.3 F (36.8 C)  Resp: 16   Filed Vitals:   06/10/13 1219  Height: 5\' 10"  (1.778 m)  Weight: 192 lb (87.091 kg)  Body mass index is 27.55 kg/(m^2). Ideal Body Weight: Weight in (lb) to have BMI = 25: 173.9  GEN: WDWN, NAD, Non-toxic, A & O x 3 HEENT: Atraumatic, Normocephalic. Neck supple. No masses, No LAD. Ears and Nose: No external deformity. CV: RRR, No M/G/R. No JVD. No thrill. No extra heart sounds. PULM: CTA B, no wheezes, crackles, rhonchi. No retractions. No resp. distress. No accessory muscle use. ABD: S, NT, ND, +BS. No rebound. No HSM. EXTR: No c/c/e NEURO Normal gait.  PSYCH: Normally interactive. Conversant. Not depressed or anxious appearing.  Calm demeanor.    Assessment and Plan: Bronchitis zpak tussionex  Signed,  Phillips Odor, MD

## 2013-06-10 NOTE — Patient Instructions (Signed)

## 2013-08-08 ENCOUNTER — Other Ambulatory Visit: Payer: Self-pay | Admitting: Internal Medicine

## 2013-08-08 NOTE — Telephone Encounter (Signed)
Rx sent to the pharmacy by e-script.//AB/CMA 

## 2013-10-01 ENCOUNTER — Encounter: Payer: BC Managed Care – PPO | Admitting: Internal Medicine

## 2013-10-08 ENCOUNTER — Encounter: Payer: Self-pay | Admitting: Gynecology

## 2013-10-26 ENCOUNTER — Encounter: Payer: Self-pay | Admitting: Genetic Counselor

## 2013-10-26 DIAGNOSIS — Z8041 Family history of malignant neoplasm of ovary: Secondary | ICD-10-CM

## 2013-10-26 NOTE — Progress Notes (Signed)
The PALB2 gene variant of uncertain significance previously found in Dominique Lane called PALB2, c.2816T>G has been reclassified by [employer] as benign. This means Dominique Lane's genetic test for the myRisk gene panel is now completley negative. This information was discussed with Dominique Lane by telephone today and she is aware of the updated result. The new myRisk test report will be uploaded into [employer].  °

## 2013-12-04 ENCOUNTER — Encounter: Payer: BC Managed Care – PPO | Admitting: Internal Medicine

## 2013-12-29 ENCOUNTER — Ambulatory Visit (INDEPENDENT_AMBULATORY_CARE_PROVIDER_SITE_OTHER): Payer: BC Managed Care – PPO | Admitting: Physician Assistant

## 2013-12-29 VITALS — BP 116/78 | HR 71 | Temp 98.0°F | Resp 16 | Ht 70.0 in | Wt 192.0 lb

## 2013-12-29 DIAGNOSIS — L255 Unspecified contact dermatitis due to plants, except food: Secondary | ICD-10-CM

## 2013-12-29 DIAGNOSIS — L237 Allergic contact dermatitis due to plants, except food: Secondary | ICD-10-CM

## 2013-12-29 MED ORDER — PREDNISONE 20 MG PO TABS: ORAL_TABLET | ORAL | Status: DC

## 2013-12-29 MED ORDER — HYDROXYZINE HCL 25 MG PO TABS: 12.5000 mg | ORAL_TABLET | Freq: Three times a day (TID) | ORAL | Status: DC | PRN

## 2013-12-29 NOTE — Progress Notes (Signed)
   Subjective:    Patient ID: Dominique Lane, female    DOB: 1960-08-10, 53 y.o.   MRN: [id number]   PCP: Unice Cobble, MD  Chief Complaint  Patient presents with  . Poison Ivy    arms and face, 2 days    Medications, allergies, past medical history, surgical history, family history, social history and problem list reviewed and updated.  HPI  Presents with several days of itchy rash, poison ivy, after doing yard work.  She has taken Benadryl but continues to itch terribly and develop new lesions.  This morning, she noted lesions on her LEFT face and decided to come in.  Review of Systems     Objective:   Physical Exam  Constitutional: She is oriented to person, place, and time. She appears well-developed and well-nourished. No distress.  BP 116/78  Pulse 71  Temp(Src) 98 F (36.7 C)  Resp 16  Ht 5\' 10"  (1.778 m)  Wt 192 lb (87.091 kg)  BMI 27.55 kg/m2  SpO2 96%   HENT:  Mouth/Throat: Oropharynx is clear and moist.  Eyes: Conjunctivae and EOM are normal. Right eye exhibits no discharge. Left eye exhibits no discharge. No scleral icterus.  Cardiovascular: Normal rate.   Pulmonary/Chest: Effort normal.  Neurological: She is alert and oriented to person, place, and time.  Skin: Skin is warm and dry. Rash noted. Rash is papular, vesicular and urticarial.     Psychiatric: She has a normal mood and affect. Her behavior is normal. Thought content normal.         Assessment & Plan:  1. Allergic contact dermatitis due to plant Supportive care.  Anticipatory guidance.  RTC if symptoms worsen/persist. - hydrOXYzine (ATARAX/VISTARIL) 25 MG tablet; Take 0.5-1 tablets (12.5-25 mg total) by mouth every 8 (eight) hours as needed for itching.  Dispense: 30 tablet; Refill: 0 - predniSONE (DELTASONE) 20 MG tablet; Take 3 PO QAM x3days, 2 PO QAM x3days, 1 PO QAM x3days  Dispense: 18 tablet; Refill: 0   Fara Chute, PA-C Physician Assistant-Certified [facility]

## 2013-12-29 NOTE — Patient Instructions (Signed)
Drink at least 64 ounces of water daily. Consider a humidifier for the room where you sleep. Bathe once daily. Avoid using HOT water, as it dries skin. Avoid deodorant soaps (Dial is the worst!) and stick with gentle cleansers (I like Cetaphil Liquid Cleanser). After bathing, dry off completely, then apply a thick emollient cream (I like Cetaphil Moisturizing Cream). Apply the cream twice daily, or more!  

## 2014-01-18 ENCOUNTER — Encounter (HOSPITAL_COMMUNITY): Payer: Self-pay | Admitting: Emergency Medicine

## 2014-01-18 ENCOUNTER — Inpatient Hospital Stay (HOSPITAL_COMMUNITY)
Admission: EM | Admit: 2014-01-18 | Discharge: 2014-01-21 | DRG: 871 | Disposition: A | Discharge status: Home / Self Care | Payer: BC Managed Care – PPO | Attending: Internal Medicine | Admitting: Internal Medicine

## 2014-01-18 ENCOUNTER — Emergency Department (INDEPENDENT_AMBULATORY_CARE_PROVIDER_SITE_OTHER)
Admission: EM | Admit: 2014-01-18 | Discharge: 2014-01-18 | Disposition: A | Discharge status: Nursing Home (Includes ICF) | Payer: BC Managed Care – PPO | Source: Home / Self Care | Attending: Family Medicine | Admitting: Family Medicine

## 2014-01-18 ENCOUNTER — Inpatient Hospital Stay (HOSPITAL_COMMUNITY): Payer: BC Managed Care – PPO

## 2014-01-18 DIAGNOSIS — J189 Pneumonia, unspecified organism: Secondary | ICD-10-CM | POA: Diagnosis present

## 2014-01-18 DIAGNOSIS — E785 Hyperlipidemia, unspecified: Secondary | ICD-10-CM | POA: Diagnosis present

## 2014-01-18 DIAGNOSIS — L259 Unspecified contact dermatitis, unspecified cause: Secondary | ICD-10-CM | POA: Diagnosis present

## 2014-01-18 DIAGNOSIS — Z791 Long term (current) use of non-steroidal anti-inflammatories (NSAID): Secondary | ICD-10-CM | POA: Diagnosis not present

## 2014-01-18 DIAGNOSIS — Z8052 Family history of malignant neoplasm of bladder: Secondary | ICD-10-CM | POA: Diagnosis not present

## 2014-01-18 DIAGNOSIS — Z9089 Acquired absence of other organs: Secondary | ICD-10-CM

## 2014-01-18 DIAGNOSIS — Z806 Family history of leukemia: Secondary | ICD-10-CM | POA: Diagnosis not present

## 2014-01-18 DIAGNOSIS — Z8249 Family history of ischemic heart disease and other diseases of the circulatory system: Secondary | ICD-10-CM | POA: Diagnosis not present

## 2014-01-18 DIAGNOSIS — E860 Dehydration: Secondary | ICD-10-CM | POA: Diagnosis present

## 2014-01-18 DIAGNOSIS — Z79899 Other long term (current) drug therapy: Secondary | ICD-10-CM | POA: Diagnosis not present

## 2014-01-18 DIAGNOSIS — N61 Mastitis without abscess: Secondary | ICD-10-CM | POA: Diagnosis not present

## 2014-01-18 DIAGNOSIS — Z8041 Family history of malignant neoplasm of ovary: Secondary | ICD-10-CM | POA: Diagnosis not present

## 2014-01-18 DIAGNOSIS — Z807 Family history of other malignant neoplasms of lymphoid, hematopoietic and related tissues: Secondary | ICD-10-CM | POA: Diagnosis not present

## 2014-01-18 DIAGNOSIS — Z9851 Tubal ligation status: Secondary | ICD-10-CM

## 2014-01-18 DIAGNOSIS — A419 Sepsis, unspecified organism: Secondary | ICD-10-CM | POA: Diagnosis present

## 2014-01-18 LAB — CBC WITH DIFFERENTIAL/PLATELET
BASOS ABS: 0 10*3/uL (ref 0.0–0.1)
Basophils Relative: 0 % (ref 0–1)
Eosinophils Absolute: 0 10*3/uL (ref 0.0–0.7)
Eosinophils Relative: 0 % (ref 0–5)
HCT: 38.6 % (ref 36.0–46.0)
Hemoglobin: 12.9 g/dL (ref 12.0–15.0)
LYMPHS PCT: 6 % — AB (ref 12–46)
Lymphs Abs: 0.8 10*3/uL (ref 0.7–4.0)
MCH: 30.9 pg (ref 26.0–34.0)
MCHC: 33.4 g/dL (ref 30.0–36.0)
MCV: 92.3 fL (ref 78.0–100.0)
Monocytes Absolute: 0.6 10*3/uL (ref 0.1–1.0)
Monocytes Relative: 5 % (ref 3–12)
NEUTROS ABS: 11.9 10*3/uL — AB (ref 1.7–7.7)
NEUTROS PCT: 89 % — AB (ref 43–77)
PLATELETS: 167 10*3/uL (ref 150–400)
RBC: 4.18 MIL/uL (ref 3.87–5.11)
RDW: 12.7 % (ref 11.5–15.5)
WBC: 13.4 10*3/uL — AB (ref 4.0–10.5)

## 2014-01-18 LAB — HEPATIC FUNCTION PANEL
ALBUMIN: 3 g/dL — AB (ref 3.5–5.2)
ALK PHOS: 85 U/L (ref 39–117)
ALT: 15 U/L (ref 0–35)
AST: 16 U/L (ref 0–37)
Bilirubin, Direct: <0.2 mg/dL (ref 0.0–0.3)
TOTAL PROTEIN: 6.1 g/dL (ref 6.0–8.3)
Total Bilirubin: 0.5 mg/dL (ref 0.3–1.2)

## 2014-01-18 LAB — BASIC METABOLIC PANEL
ANION GAP: 17 — AB (ref 5–15)
BUN: 9 mg/dL (ref 6–23)
CALCIUM: 9 mg/dL (ref 8.4–10.5)
CHLORIDE: 98 meq/L (ref 96–112)
CO2: 22 meq/L (ref 19–32)
Creatinine, Ser: 0.69 mg/dL (ref 0.50–1.10)
GFR calc Af Amer: >90 mL/min (ref >90)
GFR calc non Af Amer: >90 mL/min (ref >90)
Glucose, Bld: 117 mg/dL — ABNORMAL HIGH (ref 70–99)
Potassium: 3.8 mEq/L (ref 3.7–5.3)
SODIUM: 137 meq/L (ref 137–147)

## 2014-01-18 LAB — LACTIC ACID, PLASMA: Lactic Acid, Venous: 1.6 mmol/L (ref 0.5–2.2)

## 2014-01-18 MED ORDER — VANCOMYCIN HCL IN DEXTROSE 1-5 GM/200ML-% IV SOLN
1000.0000 mg | Freq: Two times a day (BID) | INTRAVENOUS | Status: DC
  Administered 2014-01-18 – 2014-01-20 (×5): 1000 mg via INTRAVENOUS
  Filled 2014-01-18 (×7): qty 200

## 2014-01-18 MED ORDER — ONDANSETRON HCL 4 MG/2ML IJ SOLN: 4.0000 mg | Freq: Four times a day (QID) | INTRAMUSCULAR | Status: DC | PRN

## 2014-01-18 MED ORDER — SODIUM CHLORIDE 0.9 % IJ SOLN
3.0000 mL | Freq: Two times a day (BID) | INTRAMUSCULAR | Status: DC
  Administered 2014-01-19 – 2014-01-20 (×2): 3 mL via INTRAVENOUS

## 2014-01-18 MED ORDER — ONDANSETRON HCL 4 MG/2ML IJ SOLN
4.0000 mg | Freq: Once | INTRAMUSCULAR | Status: AC
  Administered 2014-01-18: 4 mg via INTRAVENOUS
  Filled 2014-01-18: qty 2

## 2014-01-18 MED ORDER — SODIUM CHLORIDE 0.9 % IV SOLN: INTRAVENOUS | Status: AC

## 2014-01-18 MED ORDER — SODIUM CHLORIDE 0.9 % IV SOLN
1.5000 g | Freq: Three times a day (TID) | INTRAVENOUS | Status: DC
  Administered 2014-01-18 (×2): 1.5 g via INTRAVENOUS
  Filled 2014-01-18 (×4): qty 1.5

## 2014-01-18 MED ORDER — ENOXAPARIN SODIUM 40 MG/0.4ML ~~LOC~~ SOLN
40.0000 mg | SUBCUTANEOUS | Status: DC
  Administered 2014-01-18 – 2014-01-21 (×4): 40 mg via SUBCUTANEOUS
  Filled 2014-01-18 (×4): qty 0.4

## 2014-01-18 MED ORDER — SODIUM CHLORIDE 0.9 % IV BOLUS (SEPSIS)
1000.0000 mL | Freq: Once | INTRAVENOUS | Status: AC
  Administered 2014-01-18: 1000 mL via INTRAVENOUS

## 2014-01-18 MED ORDER — ACETAMINOPHEN 325 MG PO TABS
650.0000 mg | ORAL_TABLET | Freq: Four times a day (QID) | ORAL | Status: DC | PRN
  Administered 2014-01-18 – 2014-01-19 (×2): 650 mg via ORAL
  Filled 2014-01-18 (×2): qty 2

## 2014-01-18 MED ORDER — SODIUM CHLORIDE 0.9 % IV SOLN
INTRAVENOUS | Status: AC
  Administered 2014-01-18: 100 mL/h via INTRAVENOUS

## 2014-01-18 MED ORDER — MORPHINE SULFATE 4 MG/ML IJ SOLN
4.0000 mg | Freq: Once | INTRAMUSCULAR | Status: AC
  Administered 2014-01-18: 4 mg via INTRAVENOUS
  Filled 2014-01-18: qty 1

## 2014-01-18 MED ORDER — CLINDAMYCIN PHOSPHATE 600 MG/50ML IV SOLN
600.0000 mg | Freq: Once | INTRAVENOUS | Status: AC
  Administered 2014-01-18: 600 mg via INTRAVENOUS
  Filled 2014-01-18: qty 50

## 2014-01-18 MED ORDER — ONDANSETRON HCL 4 MG PO TABS: 4.0000 mg | ORAL_TABLET | Freq: Four times a day (QID) | ORAL | Status: DC | PRN

## 2014-01-18 MED ORDER — ACETAMINOPHEN 325 MG PO TABS
650.0000 mg | ORAL_TABLET | Freq: Once | ORAL | Status: AC
  Administered 2014-01-18: 650 mg via ORAL
  Filled 2014-01-18: qty 2

## 2014-01-18 MED ORDER — OXYCODONE HCL 5 MG PO TABS
5.0000 mg | ORAL_TABLET | ORAL | Status: DC | PRN
  Administered 2014-01-18 – 2014-01-19 (×3): 5 mg via ORAL
  Filled 2014-01-18 (×4): qty 1

## 2014-01-18 MED ORDER — ACETAMINOPHEN 650 MG RE SUPP: 650.0000 mg | Freq: Four times a day (QID) | RECTAL | Status: DC | PRN

## 2014-01-18 NOTE — ED Provider Notes (Signed)
CSN: [id number]     Arrival date & time 01/18/14  1045 History   First MD Initiated Contact with Patient 01/18/14 1112     Chief Complaint  Patient presents with  . Influenza  . Breast Pain   (Consider location/radiation/quality/duration/timing/severity/associated sxs/prior Treatment) Patient is a 53 y.o. female presenting with fever. The history is provided by the patient.  Fever Temp source:  Subjective Severity:  Moderate Onset quality:  Sudden Duration:  1 day Progression:  Worsening Chronicity:  New Associated symptoms: chest pain, chills, myalgias and rash   Associated symptoms: no cough, no diarrhea, no dysuria, no nausea and no vomiting   Associated symptoms comment:  Right breast pain, redness and sts.   Past Medical History  Diagnosis Date  . Fibroid   . Adenomyosis   . MVP (mitral valve prolapse)     Antibiotics required  . Heart murmur   . Hyperlipidemia   . ZYSAYTKZ(601.0)    Past Surgical History  Procedure Laterality Date  . Cholecystectomy    . Ovarian cyst removal      Right  . Tubal ligation    . Ganglion cyst excision    . Rotator cuff repair      twice on left and once on right  . Vaginal hysterectomy    . Colonoscopy  2011    Dr Amedeo Plenty  . Fractured mandible  1997    repair  . Laparoscopy N/A 09/13/2012    Procedure: LAPAROSCOPY OPERATIVE;  Surgeon: Terrance Mass, MD;  Location: [facility];  Service: Gynecology;  Laterality: N/A;  . Salpingoophorectomy Bilateral 09/13/2012    Procedure: SALPINGO OOPHORECTOMY;  Surgeon: Terrance Mass, MD;  Location: [facility];  Service: Gynecology;  Laterality: Bilateral;  . Laparoscopic lysis of adhesions N/A 09/13/2012    Procedure: LAPAROSCOPIC LYSIS OF ADHESIONS;  Surgeon: Terrance Mass, MD;  Location: [facility];  Service: Gynecology;  Laterality: N/A;   Family History  Problem Relation Age of Onset  . Heart disease Mother   . Hypertension Sister   . Heart disease Sister   . Cancer Sister     ? colon  .  Ovarian cancer Sister 69  . Bladder Cancer Father 24  . Lymphoma Paternal Aunt   . Leukemia Paternal Grandfather    History  Substance Use Topics  . Smoking status: Never Smoker   . Smokeless tobacco: Never Used  . Alcohol Use: 0.0 oz/week     Comment: occasionally; < 2 glasseswine / month   OB History   Grav Para Term Preterm Abortions TAB SAB Ect Mult Living   2 2 2       2      Review of Systems  Constitutional: Positive for fever and chills.  HENT: Negative.   Respiratory: Negative.  Negative for cough.   Cardiovascular: Positive for chest pain.  Gastrointestinal: Negative.  Negative for nausea, vomiting and diarrhea.  Genitourinary: Negative.  Negative for dysuria.  Musculoskeletal: Positive for myalgias.  Skin: Positive for rash.    Allergies  Cephalosporins and Naproxen sodium  Home Medications   Prior to Admission medications   Medication Sig Start Date End Date Taking? Authorizing Provider  hydrOXYzine (ATARAX/VISTARIL) 25 MG tablet Take 0.5-1 tablets (12.5-25 mg total) by mouth every 8 (eight) hours as needed for itching. 12/29/13   Chelle Janalee Dane, PA-C  predniSONE (DELTASONE) 20 MG tablet Take 3 PO QAM x3days, 2 PO QAM x3days, 1 PO QAM x3days 12/29/13   Fara Chute, PA-C  BP 133/76  Pulse 104  Temp(Src) 100.8 F (38.2 C) (Oral)  Resp 20  SpO2 95% Physical Exam  Nursing note and vitals reviewed. Constitutional: She is oriented to person, place, and time. She appears well-developed and well-nourished. She appears distressed.  HENT:  Head: Normocephalic.  Right Ear: External ear normal.  Left Ear: External ear normal.  Mouth/Throat: Oropharynx is clear and moist.  Eyes: Conjunctivae are normal. Pupils are equal, round, and reactive to light.  Neck: Normal range of motion. Neck supple.  Cardiovascular: Normal heart sounds.   Pulmonary/Chest: Effort normal and breath sounds normal. She exhibits tenderness. Right breast exhibits skin change and  tenderness. Left breast exhibits no skin change and no tenderness.    Neurological: She is alert and oriented to person, place, and time.  Skin: Rash noted. There is erythema.    ED Course  Procedures (including critical care time) Labs Review Labs Reviewed - No data to display  Imaging Review No results found.   MDM   1. Cellulitis of female breast    Sent for further eval and prob iv abx of right breast cellulitis.    Billy Fischer, MD 01/18/14 1226

## 2014-01-18 NOTE — H&P (Signed)
[facility] History and Physical  Dominique Lane YSA:[id number] DOB: 09-12-1960 DOA: 01/18/2014  Referring physician:Tiffany Marilu Favre, PA-C  PCP: Unice Cobble, MD   Chief Complaint:  Breast Pain      HPI:  53 year old female with a history of heart murmur, hyperlipidemia who presents to urgent care with complaints of feeling weak, but myalgias and somnolent for the last 1-1/2 days, symptoms started at 3 PM yesterday when the patient thought that she had a flu and found to have increased erythema and redness around her right breast. She does not recall any injury or any trauma to her right breast. She was treated for allergic contact dermatitis due to  poison ivy on 7/4. She had a mammogram one year ago which was normal  In the ED the patient was found to be tachycardic and febrile with a fever of 100.8 the patient is being admitted for IV antibiotics and possible sepsis.     Review of Systems: negative for the following  Constitutional: Positive for fever and chills.  HENT: Negative.  Respiratory: Negative. Negative for cough.  Cardiovascular: Positive for chest pain.  Gastrointestinal: Negative. Negative for nausea, vomiting and diarrhea.  Genitourinary: Negative. Negative for dysuria.  Musculoskeletal: Positive for myalgias.  Skin: Positive for rash.  Neurological: Denies dizziness, seizures, syncope, weakness, light-headedness, numbness and headaches.  Hematological: Denies adenopathy. Easy bruising, personal or family bleeding history  Psychiatric/Behavioral: Denies suicidal ideation, mood changes, confusion, nervousness, sleep disturbance and agitation       Past Medical History  Diagnosis Date  . Fibroid   . Adenomyosis   . MVP (mitral valve prolapse)     Antibiotics required  . Heart murmur   . Hyperlipidemia   . NATFTDDU(202.5)      Past Surgical History  Procedure Laterality Date  . Cholecystectomy    . Ovarian cyst removal      Right  . Tubal  ligation    . Ganglion cyst excision    . Rotator cuff repair      twice on left and once on right  . Vaginal hysterectomy    . Colonoscopy  2011    Dr Amedeo Plenty  . Fractured mandible  1997    repair  . Laparoscopy N/A 09/13/2012    Procedure: LAPAROSCOPY OPERATIVE;  Surgeon: Terrance Mass, MD;  Location: [facility];  Service: Gynecology;  Laterality: N/A;  . Salpingoophorectomy Bilateral 09/13/2012    Procedure: SALPINGO OOPHORECTOMY;  Surgeon: Terrance Mass, MD;  Location: [facility];  Service: Gynecology;  Laterality: Bilateral;  . Laparoscopic lysis of adhesions N/A 09/13/2012    Procedure: LAPAROSCOPIC LYSIS OF ADHESIONS;  Surgeon: Terrance Mass, MD;  Location: [facility];  Service: Gynecology;  Laterality: N/A;      Social History:  reports that she has never smoked. She has never used smokeless tobacco. She reports that she drinks alcohol. She reports that she does not use illicit drugs.    Allergies  Allergen Reactions  . Cephalosporins Hives    All over the body.  . Naproxen Sodium Anaphylaxis    Family History  Problem Relation Age of Onset  . Heart disease Mother   . Hypertension Sister   . Heart disease Sister   . Cancer Sister     ? colon  . Ovarian cancer Sister 67  . Bladder Cancer Father 33  . Lymphoma Paternal Aunt   . Leukemia Paternal Grandfather      Prior to Admission medications   Medication Sig Start  Date End Date Taking? Authorizing Provider  DM-APAP-CPM (CORICIDIN HBP FLU PO) Take 2 tablets by mouth daily as needed (for flu symptoms).   Yes Historical Provider, MD  ibuprofen (ADVIL,MOTRIN) 200 MG tablet Take 800 mg by mouth every 6 (six) hours as needed for headache or moderate pain.   Yes Historical Provider, MD  Red Yeast Rice Extract (RED YEAST RICE PO) Take 1 tablet by mouth 2 (two) times daily.   Yes Historical Provider, MD     Physical Exam: Filed Vitals:   01/18/14 1427 01/18/14 1430 01/18/14 1500 01/18/14 1530  BP:  113/66 115/69 116/69   Pulse:  95 98 101  Temp: 100 F (37.8 C)     TempSrc: Oral     Resp:  20 21 20   Height:      Weight:      SpO2:  94% 93% 93%     Constitutional: Vital signs reviewed. Patient is a well-developed and well-nourished in no acute distress and cooperative with exam. Alert and oriented x3.  Head: Normocephalic and atraumatic  Ear: TM normal bilaterally  Mouth: no erythema or exudates, MMM  Eyes: PERRL, EOMI, conjunctivae normal, No scleral icterus.  Neck: Supple, Trachea midline normal ROM, No JVD, mass, thyromegaly, or carotid bruit present.  Cardiovascular: RRR, S1 normal, S2 normal, no MRG, pulses symmetric and intact bilaterally  Pulmonary/Chest: CTAB, no wheezes, rales, or rhonchi Effort normal and breath sounds normal. She exhibits tenderness. Right breast exhibits skin change and tenderness. Left breast exhibits no skin change and no tenderness.  Gastrointestinal, non-distended, bowel sounds are normal, no masses, organomegaly, or guarding present.  GU: no CVA tenderness Musculoskeletal: No joint deformities, erythema, or stiffness, ROM full and no nontender Ext: no edema and no cyanosis, pulses palpable bilaterally (DP and PT)  Hematology: no cervical, inginal, or axillary adenopathy.  Neurological: A&O x3, Strenght is normal and symmetric bilaterally, cranial nerve II-XII are grossly intact, no focal motor deficit, sensory intact to light touch bilaterally.  Skin: Warm, dry and intact. No rash, cyanosis, or clubbing.  Psychiatric: Normal mood and affect. speech and behavior is normal. Judgment and thought content normal. Cognition and memory are normal.       Labs on Admission:    Basic Metabolic Panel:  Recent Labs Lab 01/18/14 1305  NA 137  K 3.8  CL 98  CO2 22  GLUCOSE 117*  BUN 9  CREATININE 0.69  CALCIUM 9.0   Liver Function Tests:  Recent Labs Lab 01/18/14 1511  AST 16  ALT 15  ALKPHOS 85  BILITOT 0.5  PROT 6.1  ALBUMIN 3.0*   No results found  for this basename: LIPASE, AMYLASE,  in the last 168 hours No results found for this basename: AMMONIA,  in the last 168 hours CBC:  Recent Labs Lab 01/18/14 1305  WBC 13.4*  NEUTROABS 11.9*  HGB 12.9  HCT 38.6  MCV 92.3  PLT 167   Cardiac Enzymes: No results found for this basename: CKTOTAL, CKMB, CKMBINDEX, TROPONINI,  in the last 168 hours  BNP (last 3 results) No results found for this basename: PROBNP,  in the last 8760 hours    CBG: No results found for this basename: GLUCAP,  in the last 168 hours  Radiological Exams on Admission: No results found.  EKG: Independently reviewed.   Assessment/Plan Active Problems:   Cellulitis of female breast   Cellulitis of breast  Cellulitis of the right breast Patient is being admitted for IV antibiotics We'll start  the patient on vancomycin and Unasyn Blood cultures have been drawn We'll order ultrasound of the right breast, no palpable abscess Follow blood culture   Tachycardia Likely secondary to possible SIRS Maintained on telemetry   Anticipate discharge in 2-3 days    Family Communication: bedside Disposition Plan: admit   Time spent: 70 mins   [facility] Pager [phone]  If 7PM-7AM, please contact night-coverage [email or web address] Password [facility] 01/18/2014, 5:07 PM

## 2014-01-18 NOTE — ED Notes (Signed)
C/o pain in right breast on set last night.  States woke this a.m breast is red and warm to touch.    Also c/o flu like symptoms.  Body aches.  Weakness.  On set yesterday.  No relief with otc meds.    Denies n/v/d

## 2014-01-18 NOTE — ED Notes (Addendum)
Pt states she was at [facility] today and they sent her down here for her right breast pain since last night and the redness started this morning. States it is painful. Pt denies any discharge from nipple, reports nipple is itching.

## 2014-01-18 NOTE — ED Provider Notes (Signed)
CSN: [id number]     Arrival date & time 01/18/14  1233 History   First MD Initiated Contact with Patient 01/18/14 1252     Chief Complaint  Patient presents with  . Breast Pain     (Consider location/radiation/quality/duration/timing/severity/associated sxs/prior Treatment) HPI   Patient to the ED sent from Urgent care for further managment and treatment of weakness and right breast cellulitis and IV abx. She reports not feeling well and fatigued yesterday wanting to lay down all afternoon. Then this morning she developed some pain in her right breast and then quickly developed redness. She denies being bit by a bug or having injury to the area. She has a low grade fever, tachycardia, fatigue and reports not feeling well. She denies history of having injury to the area, denies noticing nipple discharge or deformity of the breast.   Past Medical History  Diagnosis Date  . Fibroid   . Adenomyosis   . MVP (mitral valve prolapse)     Antibiotics required  . Heart murmur   . Hyperlipidemia   . RJJOACZY(606.3)    Past Surgical History  Procedure Laterality Date  . Cholecystectomy    . Ovarian cyst removal      Right  . Tubal ligation    . Ganglion cyst excision    . Rotator cuff repair      twice on left and once on right  . Vaginal hysterectomy    . Colonoscopy  2011    Dr Amedeo Plenty  . Fractured mandible  1997    repair  . Laparoscopy N/A 09/13/2012    Procedure: LAPAROSCOPY OPERATIVE;  Surgeon: Terrance Mass, MD;  Location: [facility];  Service: Gynecology;  Laterality: N/A;  . Salpingoophorectomy Bilateral 09/13/2012    Procedure: SALPINGO OOPHORECTOMY;  Surgeon: Terrance Mass, MD;  Location: [facility];  Service: Gynecology;  Laterality: Bilateral;  . Laparoscopic lysis of adhesions N/A 09/13/2012    Procedure: LAPAROSCOPIC LYSIS OF ADHESIONS;  Surgeon: Terrance Mass, MD;  Location: [facility];  Service: Gynecology;  Laterality: N/A;   Family History  Problem Relation Age of Onset   . Heart disease Mother   . Hypertension Sister   . Heart disease Sister   . Cancer Sister     ? colon  . Ovarian cancer Sister 30  . Bladder Cancer Father 46  . Lymphoma Paternal Aunt   . Leukemia Paternal Grandfather    History  Substance Use Topics  . Smoking status: Never Smoker   . Smokeless tobacco: Never Used  . Alcohol Use: 0.0 oz/week     Comment: occasionally; < 2 glasseswine / month   OB History   Grav Para Term Preterm Abortions TAB SAB Ect Mult Living   2 2 2       2      Review of Systems  Constitutional: Positive for fever and chills.  HENT: Negative.  Respiratory: Negative. Negative for cough.  Cardiovascular: Positive for chest pain.  Gastrointestinal: Negative. Negative for nausea, vomiting and diarrhea.  Genitourinary: Negative. Negative for dysuria.  Musculoskeletal: Positive for myalgias.  Skin: Positive for rash.    Allergies  Cephalosporins and Naproxen sodium  Home Medications   Prior to Admission medications   Medication Sig Start Date End Date Taking? Authorizing Provider  DM-APAP-CPM (CORICIDIN HBP FLU PO) Take 2 tablets by mouth daily as needed (for flu symptoms).   Yes Historical Provider, MD  ibuprofen (ADVIL,MOTRIN) 200 MG tablet Take 800 mg by mouth every 6 (six)  hours as needed for headache or moderate pain.   Yes Historical Provider, MD  Red Yeast Rice Extract (RED YEAST RICE PO) Take 1 tablet by mouth 2 (two) times daily.   Yes Historical Provider, MD   BP 119/69  Pulse 98  Temp(Src) 100 F (37.8 C) (Oral)  Resp 22  Ht 5\' 10"  (1.778 m)  Wt 190 lb (86.183 kg)  BMI 27.26 kg/m2  SpO2 93% Physical Exam  Nursing note and vitals reviewed. Constitutional: She appears well-developed and well-nourished. No distress.  HENT:  Head: Normocephalic and atraumatic.  Eyes: Pupils are equal, round, and reactive to light.  Neck: Normal range of motion. Neck supple.  Cardiovascular: Normal rate and regular rhythm.   Pulmonary/Chest: Effort  normal.    Pt has cellulitis covering 1/4 of her breast tissue to right breast. The breast tissue is soft and non firm. No palpable abscesses. The skin is indurated and tender. No nipple inversion or discharge noted. Breast sizes are symmetrical.  Abdominal: Soft.  Neurological: She is alert.  Skin: Skin is warm and dry.    ED Course  Procedures (including critical care time) Labs Review Labs Reviewed  CBC WITH DIFFERENTIAL - Abnormal; Notable for the following:    WBC 13.4 (*)    Neutrophils Relative % 89 (*)    Neutro Abs 11.9 (*)    Lymphocytes Relative 6 (*)    All other components within normal limits  BASIC METABOLIC PANEL - Abnormal; Notable for the following:    Glucose, Bld 117 (*)    Anion gap 17 (*)    All other components within normal limits  CULTURE, BLOOD (ROUTINE X 2)  CULTURE, BLOOD (ROUTINE X 2)  LACTIC ACID, PLASMA    Imaging Review No results found.   EKG Interpretation None      MDM   Final diagnoses:  None  Diagnosis Cellulitis of female breast  Medications  sodium chloride 0.9 % bolus 1,000 mL (1,000 mLs Intravenous New Bag/Given 01/18/14 1426)  0.9 %  sodium chloride infusion (not administered)  sodium chloride 0.9 % bolus 1,000 mL (0 mLs Intravenous Stopped 01/18/14 1423)  acetaminophen (TYLENOL) tablet 650 mg (650 mg Oral Given 01/18/14 1313)  morphine 4 MG/ML injection 4 mg (4 mg Intravenous Given 01/18/14 1312)  ondansetron (ZOFRAN) injection 4 mg (4 mg Intravenous Given 01/18/14 1312)  clindamycin (CLEOCIN) IVPB 600 mg (0 mg Intravenous Stopped 01/18/14 1423)   Patient reports not feeling well, will hydrate, control fever and pain then re-evaluate. At this time there is no abscess that has developed and the cellulitis does not cover more than half of the breast. CBC, BMP and blood cultures ordered-->   2: 55 pm Pt received Clindamycin for infection but blood cultures were discontinued.  I reordered them and added on a lactate. She has  received the pain medication, Tylenol, and 1 L of fluids and still reports feeling weak and sick. I have added on a second liter of fluid and called for admission.  3:10pm  Inpatient, [place], [facility], inpatient, Tele    Linus Mako, PA-C 01/18/14 1510

## 2014-01-18 NOTE — Progress Notes (Signed)
ANTIBIOTIC CONSULT NOTE - INITIAL  Pharmacy Consult for Vancomycin, Unasyn  Indication: Cellulitis   Allergies  Allergen Reactions  . Cephalosporins Hives    All over the body.  . Naproxen Sodium Anaphylaxis    Patient Measurements: Height: 5\' 10"  (177.8 cm) Weight: 190 lb (86.183 kg) IBW/kg (Calculated) : 68.5 Adjusted Body Weight: n/a   Vital Signs: Temp: 100 F (37.8 C) (07/24 1427) Temp src: Oral (07/24 1427) BP: 119/69 mmHg (07/24 1400) Pulse Rate: 98 (07/24 1400) Intake/Output from previous day:   Intake/Output from this shift:    Labs:  Recent Labs  01/18/14 1305  WBC 13.4*  HGB 12.9  PLT 167  CREATININE 0.69   Estimated Creatinine Clearance: 94.8 ml/min (by C-G formula based on Cr of 0.69). No results found for this basename: VANCOTROUGH, VANCOPEAK, VANCORANDOM, GENTTROUGH, GENTPEAK, GENTRANDOM, TOBRATROUGH, TOBRAPEAK, TOBRARND, AMIKACINPEAK, AMIKACINTROU, AMIKACIN,  in the last 72 hours   Microbiology: No results found for this or any previous visit (from the past 720 hour(s)).  Medical History: Past Medical History  Diagnosis Date  . Fibroid   . Adenomyosis   . MVP (mitral valve prolapse)     Antibiotics required  . Heart murmur   . Hyperlipidemia   . Headache(784.0)     Medications:   (Not in a hospital admission) Assessment: Dominique Lane sent to the ED from Urgent care for further management and treatment of weakness and right breast cellulitis. Pharmacy consulted to start empiric abx. She was given clindamycin but cultures were d/c'ed. Cultures now re-ordered. Per patient, she can tolerate penicillins. WBC elevated. Tm 100.8. CrCl ~ 95 mL/min   Cultures: 7/24 Blood Cx x2>>   Goal of Therapy:  Vancomycin trough level 10-15 mcg/ml  Plan:  -Start Unasyn 1.5 gm IV Q 8 hours  -Start Vancomycin 1 gm IV Q 12 hours  -Monitor CBC, renal fx, cultures and patient's clinical progress -Collect VT at [facility], PharmD.  Clinical  Pharmacist Pager [phone]

## 2014-01-18 NOTE — Progress Notes (Signed)
Attempt to call for report made.  

## 2014-01-18 NOTE — ED Provider Notes (Signed)
  This was a shared visit with a mid-level provided (NP or PA).  Throughout the patient's course I was available for consultation/collaboration.  I saw the ECG (if appropriate), relevant labs and studies - I agree with the interpretation.  On my exam the patient was in no distress.  However, she continued to complain of pain are given concern for cellulitis, she was admitted for further evaluation and management.       Carmin Muskrat, MD 01/18/14 1524

## 2014-01-18 NOTE — Progress Notes (Signed)
Jossette Zirbel [id number] Admission Data: 01/18/2014 6:42 PM Attending Provider: Reyne Dumas, MD  PXT:[id number] Linna Darner, MD Consults/ Treatment Team:    Bhumi Godbey is a 53 y.o. female patient admitted from ED awake, alert  & orientated  X 3,  Full Code, VSS - Blood pressure 125/81, pulse 97, temperature 98.7 F (37.1 C), temperature source Oral, resp. rate 20, height 5' 10.5" (1.791 m), weight 89.449 kg (197 lb 3.2 oz), SpO2 97.00%.,no c/o shortness of breath, no c/o chest pain, no distress noted. Tele # 15 placed.   IV site WDL:  Right a/c NS at 171ml/hour.  Allergies:   Allergies  Allergen Reactions  . Cephalosporins Hives    All over the body.  . Naproxen Sodium Anaphylaxis     Past Medical History  Diagnosis Date  . Fibroid   . Adenomyosis   . MVP (mitral valve prolapse)     Antibiotics required  . Heart murmur   . Hyperlipidemia   . Headache(784.0)      Pt orientation to unit, room and routine. Information packet given to patient/family. Admission INP armband ID verified with patient/family, and in place. SR up x 2, fall risk assessment complete with Patient and family verbalizing understanding of risks associated with falls. Pt verbalizes an understanding of how to use the call bell and to call for help before getting out of bed.  Skin, clean-dry- intact without evidence of bruising, or skin tears.   No evidence of skin break down noted on exam.     Will cont to monitor and assist as needed.  Dayle Points, RN 01/18/2014 6:42 PM

## 2014-01-19 ENCOUNTER — Inpatient Hospital Stay (HOSPITAL_COMMUNITY): Payer: BC Managed Care – PPO

## 2014-01-19 MED ORDER — HYDROXYZINE HCL 25 MG PO TABS
25.0000 mg | ORAL_TABLET | Freq: Four times a day (QID) | ORAL | Status: DC | PRN
  Administered 2014-01-19 (×2): 25 mg via ORAL
  Filled 2014-01-19 (×2): qty 1

## 2014-01-19 MED ORDER — PIPERACILLIN-TAZOBACTAM 3.375 G IVPB
3.3750 g | Freq: Three times a day (TID) | INTRAVENOUS | Status: DC
  Administered 2014-01-19 – 2014-01-21 (×6): 3.375 g via INTRAVENOUS
  Filled 2014-01-19 (×10): qty 50

## 2014-01-19 NOTE — Progress Notes (Signed)
Nutrition Brief Note  Patient identified on the Malnutrition Screening Tool (MST) Report  Malnutrition Screening Tool result filed inaccurately.  Pt asleep at time of visit. Unsure of PO intake but, no evidence of weight loss per weight history below,   Wt Readings from Last 15 Encounters:  01/18/14 197 lb 3.2 oz (89.449 kg)  12/29/13 192 lb (87.091 kg)  06/10/13 192 lb (87.091 kg)  09/12/12 193 lb (87.544 kg)  09/12/12 193 lb (87.544 kg)  09/12/12 195 lb 9.6 oz (88.724 kg)  09/08/12 193 lb (87.544 kg)  09/06/12 198 lb (89.812 kg)  04/10/12 194 lb 6.4 oz (88.179 kg)  01/10/12 194 lb 3.2 oz (88.089 kg)  11/25/11 196 lb 2 oz (88.962 kg)  11/11/11 196 lb (88.905 kg)  10/05/11 194 lb (87.998 kg)  06/13/09 188 lb 2.1 oz (85.336 kg)  01/23/09 183 lb 12.8 oz (83.371 kg)    Body mass index is 27.89 kg/(m^2). Patient meets criteria for Overweight based on current BMI.  Current diet order is Regular. Labs and medications reviewed.  No nutrition interventions warranted at this time. If nutrition issues arise, please consult RD.   Pryor Ochoa RD, LDN Inpatient Clinical Dietitian Pager: [phone] After Hours Pager: [phone]

## 2014-01-19 NOTE — Progress Notes (Signed)
[facility] PROGRESS NOTE  Dominique Lane FVC:[id number] DOB: 16-Jul-1960 DOA: 01/18/2014 PCP: Dominique Cobble, MD  Assessment/Plan: Active Problems:   Cellulitis of female breast   Cellulitis of breast     Cellulitis of the right breast  Patient on vancomycin and Zosyn Follow blood culture Blood cultures have been drawn  Ultrasound of the right breast is pending to rule out abscess Follow blood culture   Pneumonia X-ray shows bilateral airspace disease Suspected right lung base pneumonia Continue antibiotics as above    Sinus Tachycardia secondary to the above Likely secondary to possible SIRS  Maintained on telemetry    Rash Patient has an eczematous dermatitis not typical of Lyme disease Recent poison ivy exposure Started on Atarax As per patient request have ordered Lyme titer  Code Status: full Family Communication: family updated about patient's clinical progress Disposition Plan: Unstable for discharge   Brief narrative: 53 year old female with a history of heart murmur, hyperlipidemia who presents to urgent care with complaints of feeling weak, but myalgias and somnolent for the last 1-1/2 days, symptoms started at 3 PM yesterday when the patient thought that she had a flu and found to have increased erythema and redness around her right breast. She does not recall any injury or any trauma to her right breast. She was treated for allergic contact dermatitis due to poison ivy on 7/4. She had a mammogram one year ago which was normal  In the ED the patient was found to be tachycardic and febrile with a fever of 100.8 the patient is being admitted for IV antibiotics and possible sepsis.   Consultants: None  Procedures:  None  Antibiotics:  Vancomycin Zosyn  HPI/Subjective: Patient complaining of a rash on her upper and lower extremities, complaining of itching, also has a mild cough  Objective: Filed Vitals:   01/18/14 2036 01/18/14 2230  01/19/14 0207 01/19/14 0414  BP: 117/73   101/61  Pulse: 110   104  Temp: 102.8 F (39.3 C) 99.1 F (37.3 C) 102.8 F (39.3 C) 99.9 F (37.7 C)  TempSrc: Oral Oral Oral Oral  Resp: 20   20  Height:      Weight:      SpO2: 95%   93%    Intake/Output Summary (Last 24 hours) at 01/19/14 1306 Last data filed at 01/19/14 1039  Gross per 24 hour  Intake   2572 ml  Output      0 ml  Net   2572 ml    Exam:  HENT:  Head: Atraumatic.  Nose: Nose normal.  Mouth/Throat: Oropharynx is clear and moist.  Eyes: Conjunctivae are normal. Pupils are equal, round, and reactive to light. No scleral icterus.  Neck: Neck supple. No tracheal deviation present.  Cardiovascular: Normal rate, regular rhythm, normal heart sounds and intact distal pulses.  Pulmonary/Chest: Effort normal and breath sounds normal. No respiratory distress.  Abdominal: Soft. Normal appearance and bowel sounds are normal. She exhibits no distension. There is no tenderness.  Musculoskeletal: She exhibits no edema and no tenderness.  Neurological: She is alert. No cranial nerve deficit.    Data Reviewed: Basic Metabolic Panel:  Recent Labs Lab 01/18/14 1305  NA 137  K 3.8  CL 98  CO2 22  GLUCOSE 117*  BUN 9  CREATININE 0.69  CALCIUM 9.0    Liver Function Tests:  Recent Labs Lab 01/18/14 1511  AST 16  ALT 15  ALKPHOS 85  BILITOT 0.5  PROT 6.1  ALBUMIN 3.0*  No results found for this basename: LIPASE, AMYLASE,  in the last 168 hours No results found for this basename: AMMONIA,  in the last 168 hours  CBC:  Recent Labs Lab 01/18/14 1305  WBC 13.4*  NEUTROABS 11.9*  HGB 12.9  HCT 38.6  MCV 92.3  PLT 167    Cardiac Enzymes: No results found for this basename: CKTOTAL, CKMB, CKMBINDEX, TROPONINI,  in the last 168 hours BNP (last 3 results) No results found for this basename: PROBNP,  in the last 8760 hours   CBG: No results found for this basename: GLUCAP,  in the last 168  hours  Recent Results (from the past 240 hour(s))  CULTURE, BLOOD (ROUTINE X 2)     Status: None   Collection Time    01/18/14  1:19 PM      Result Value Ref Range Status   Specimen Description BLOOD LEFT ANTECUBITAL   Final   Special Requests BOTTLES DRAWN AEROBIC AND ANAEROBIC [facility]   Final   Culture  Setup Time     Final   Value: 01/18/2014 17:30     Performed at [facility]   Culture     Final   Value:        BLOOD CULTURE RECEIVED NO GROWTH TO DATE CULTURE WILL BE HELD FOR 5 DAYS BEFORE ISSUING A FINAL NEGATIVE REPORT     Performed at [facility]   Report Status PENDING   Incomplete  CULTURE, BLOOD (ROUTINE X 2)     Status: None   Collection Time    01/18/14  1:33 PM      Result Value Ref Range Status   Specimen Description BLOOD LEFT HAND   Final   Special Requests     Final   Value: BOTTLES DRAWN AEROBIC AND ANAEROBIC 5CC BLUE, 2CC RED   Culture  Setup Time     Final   Value: 01/18/2014 17:30     Performed at [facility]   Culture     Final   Value:        BLOOD CULTURE RECEIVED NO GROWTH TO DATE CULTURE WILL BE HELD FOR 5 DAYS BEFORE ISSUING A FINAL NEGATIVE REPORT     Performed at [facility]   Report Status PENDING   Incomplete     Studies: X-ray Chest Pa And Lateral   01/18/2014   CLINICAL DATA:  Fever for 1 day with weakness nausea and right chest soreness  EXAM: CHEST  2 VIEW  COMPARISON:  09/26/2008  FINDINGS: Heart size and vascular pattern are normal. There is mild atelectatic change at the left base. On the right, there is obliquely oriented opacity in the mid to lower lung zone medially. There is depression of the horizontal fissure on the right. There is no pleural effusion.  IMPRESSION: Bilateral airspace disease new from 2010. This appears most consistent with atelectasis on the left. There is likely also atelectatic change on the right, but there is more extensive opacity in the medial right lung base and pneumonia is  suspected. Recommend radiographic follow-up after appropriate therapy to ensure resolution of bibasilar infiltrates.   Electronically Signed   By: Dominique Lane M.D.   On: 01/18/2014 20:04    Scheduled Meds: . sodium chloride   Intravenous STAT  . enoxaparin (LOVENOX) injection  40 mg Subcutaneous Q24H  . piperacillin-tazobactam (ZOSYN)  IV  3.375 g Intravenous 3 times per day  . sodium chloride  3 mL Intravenous Q12H  .  vancomycin  1,000 mg Intravenous Q12H   Continuous Infusions: . sodium chloride 100 mL/hr (01/18/14 1630)    Active Problems:   Cellulitis of female breast   Cellulitis of breast    Time spent: 40 minutes   [place] [facility] Pager [phone]. If 8PM-8AM, please contact night-coverage at [email or web address], password [facility] 01/19/2014, 1:06 PM  LOS: 1 day

## 2014-01-20 LAB — COMPREHENSIVE METABOLIC PANEL
ALK PHOS: 89 U/L (ref 39–117)
ALT: 13 U/L (ref 0–35)
AST: 12 U/L (ref 0–37)
Albumin: 3 g/dL — ABNORMAL LOW (ref 3.5–5.2)
Anion gap: 13 (ref 5–15)
BUN: 5 mg/dL — ABNORMAL LOW (ref 6–23)
CO2: 26 mEq/L (ref 19–32)
Calcium: 8.5 mg/dL (ref 8.4–10.5)
Chloride: 104 mEq/L (ref 96–112)
Creatinine, Ser: 0.75 mg/dL (ref 0.50–1.10)
GFR calc non Af Amer: >90 mL/min (ref >90)
GLUCOSE: 99 mg/dL (ref 70–99)
POTASSIUM: 3.2 meq/L — AB (ref 3.7–5.3)
Sodium: 143 mEq/L (ref 137–147)
Total Bilirubin: 0.4 mg/dL (ref 0.3–1.2)
Total Protein: 6.3 g/dL (ref 6.0–8.3)

## 2014-01-20 LAB — CBC
HEMATOCRIT: 33.4 % — AB (ref 36.0–46.0)
Hemoglobin: 10.9 g/dL — ABNORMAL LOW (ref 12.0–15.0)
MCH: 30.4 pg (ref 26.0–34.0)
MCHC: 32.6 g/dL (ref 30.0–36.0)
MCV: 93.3 fL (ref 78.0–100.0)
Platelets: 154 10*3/uL (ref 150–400)
RBC: 3.58 MIL/uL — ABNORMAL LOW (ref 3.87–5.11)
RDW: 12.7 % (ref 11.5–15.5)
WBC: 6.8 10*3/uL (ref 4.0–10.5)

## 2014-01-20 MED ORDER — SODIUM CHLORIDE 0.9 % IV SOLN
INTRAVENOUS | Status: AC
  Administered 2014-01-20: 11:00:00 via INTRAVENOUS

## 2014-01-20 NOTE — Progress Notes (Signed)
[facility] PROGRESS NOTE  Dominique Lane SWH:[id number] DOB: Jul 12, 1960 DOA: 01/18/2014 PCP: Unice Cobble, MD  Assessment/Plan: Active Problems:   Cellulitis of female breast   Cellulitis of breast    Cellulitis of the right breast  Patient on vancomycin and Zosyn  Follow blood culture  Blood cultures have been drawn  Ultrasound of the right breast Edema within the soft tissues, compatible with infection/cellulitis.  No discrete organized or focal fluid collection to suggest abscess. Follow blood culture  Leukocytosis improved  Pneumonia  X-ray shows bilateral airspace disease  Suspected right lung base pneumonia  Continue antibiotics as above    Sinus Tachycardia secondary to the above  Likely secondary to possible SIRS  Maintained on telemetry Continue IV hydration as the patient is still clinically dehydrated     Rash  Patient has an eczematous dermatitis not typical of Lyme disease  Recent poison ivy exposure  Continue hydroxyzine, As per patient request have ordered Lyme titer , pending  Code Status: full  Family Communication: family updated about patient's clinical progress  Disposition Plan: Anticipate discharge 1-2 days  Brief narrative:  53 year old female with a history of heart murmur, hyperlipidemia who presents to urgent care with complaints of feeling weak, but myalgias and somnolent for the last 1-1/2 days, symptoms started at 3 PM yesterday when the patient thought that she had a flu and found to have increased erythema and redness around her right breast. She does not recall any injury or any trauma to her right breast. She was treated for allergic contact dermatitis due to poison ivy on 7/4. She had a mammogram one year ago which was normal  In the ED the patient was found to be tachycardic and febrile with a fever of 100.8 the patient is being admitted for IV antibiotics and possible sepsis.  Consultants:  None  Procedures:   None Antibiotics:  Vancomycin Zosyn     HPI/Subjective: Feeling better but still sleepy, itching has improved  Objective: Filed Vitals:   01/19/14 0414 01/19/14 1415 01/19/14 2034 01/20/14 0400  BP: 101/61 116/78 114/75 106/73  Pulse: 104 96 88 80  Temp: 99.9 F (37.7 C) 99.5 F (37.5 C) 99.2 F (37.3 C) 99 F (37.2 C)  TempSrc: Oral Oral Oral Oral  Resp: 20 18 20 18   Height:      Weight:      SpO2: 93% 94% 94% 93%    Intake/Output Summary (Last 24 hours) at 01/20/14 1102 Last data filed at 01/20/14 0646  Gross per 24 hour  Intake 998.33 ml  Output      0 ml  Net 998.33 ml    Exam:  Skin improving erythema of the right breast, Neck: Neck supple. No tracheal deviation present.  Cardiovascular: Normal rate, regular rhythm, normal heart sounds and intact distal pulses.  Pulmonary/Chest: Effort normal and breath sounds normal. No respiratory distress.  Abdominal: Soft. Normal appearance and bowel sounds are normal. She exhibits no distension. There is no tenderness.  Musculoskeletal: She exhibits no edema and no tenderness.  Neurological: She is alert. No cranial nerve deficit.    Data Reviewed: Basic Metabolic Panel:  Recent Labs Lab 01/18/14 1305 01/20/14 0444  NA 137 143  K 3.8 3.2*  CL 98 104  CO2 22 26  GLUCOSE 117* 99  BUN 9 5*  CREATININE 0.69 0.75  CALCIUM 9.0 8.5    Liver Function Tests:  Recent Labs Lab 01/18/14 1511 01/20/14 0444  AST 16 12  ALT 15 13  ALKPHOS 85 89  BILITOT 0.5 0.4  PROT 6.1 6.3  ALBUMIN 3.0* 3.0*   No results found for this basename: LIPASE, AMYLASE,  in the last 168 hours No results found for this basename: AMMONIA,  in the last 168 hours  CBC:  Recent Labs Lab 01/18/14 1305 01/20/14 0444  WBC 13.4* 6.8  NEUTROABS 11.9*  --   HGB 12.9 10.9*  HCT 38.6 33.4*  MCV 92.3 93.3  PLT 167 154    Cardiac Enzymes: No results found for this basename: CKTOTAL, CKMB, CKMBINDEX, TROPONINI,  in the last 168  hours BNP (last 3 results) No results found for this basename: PROBNP,  in the last 8760 hours   CBG: No results found for this basename: GLUCAP,  in the last 168 hours  Recent Results (from the past 240 hour(s))  CULTURE, BLOOD (ROUTINE X 2)     Status: None   Collection Time    01/18/14  1:19 PM      Result Value Ref Range Status   Specimen Description BLOOD LEFT ANTECUBITAL   Final   Special Requests BOTTLES DRAWN AEROBIC AND ANAEROBIC [facility]   Final   Culture  Setup Time     Final   Value: 01/18/2014 17:30     Performed at [facility]   Culture     Final   Value:        BLOOD CULTURE RECEIVED NO GROWTH TO DATE CULTURE WILL BE HELD FOR 5 DAYS BEFORE ISSUING A FINAL NEGATIVE REPORT     Performed at [facility]   Report Status PENDING   Incomplete  CULTURE, BLOOD (ROUTINE X 2)     Status: None   Collection Time    01/18/14  1:33 PM      Result Value Ref Range Status   Specimen Description BLOOD LEFT HAND   Final   Special Requests     Final   Value: BOTTLES DRAWN AEROBIC AND ANAEROBIC 5CC BLUE, 2CC RED   Culture  Setup Time     Final   Value: 01/18/2014 17:30     Performed at [facility]   Culture     Final   Value:        BLOOD CULTURE RECEIVED NO GROWTH TO DATE CULTURE WILL BE HELD FOR 5 DAYS BEFORE ISSUING A FINAL NEGATIVE REPORT     Performed at [facility]   Report Status PENDING   Incomplete     Studies: X-ray Chest Pa And Lateral   01/18/2014   CLINICAL DATA:  Fever for 1 day with weakness nausea and right chest soreness  EXAM: CHEST  2 VIEW  COMPARISON:  09/26/2008  FINDINGS: Heart size and vascular pattern are normal. There is mild atelectatic change at the left base. On the right, there is obliquely oriented opacity in the mid to lower lung zone medially. There is depression of the horizontal fissure on the right. There is no pleural effusion.  IMPRESSION: Bilateral airspace disease new from 2010. This appears most consistent with  atelectasis on the left. There is likely also atelectatic change on the right, but there is more extensive opacity in the medial right lung base and pneumonia is suspected. Recommend radiographic follow-up after appropriate therapy to ensure resolution of bibasilar infiltrates.   Electronically Signed   By: Skipper Cliche M.D.   On: 01/18/2014 20:04   [place] Unlisted Procedure Breast  01/19/2014   CLINICAL DATA:  Right breast redness  EXAM:  ULTRASOUND OF THE right BREAST  COMPARISON:  No comparison studies available.  FINDINGS: Ultrasound is performed, showing edema within the subcutaneous tissues. The area of concern is in the right periareolar region. There is no underlying focal or organized fluid collection to suggest the presence of a drainable abscess.  IMPRESSION: Edema within the soft tissues, compatible with infection/cellulitis. No discrete organized or focal fluid collection to suggest abscess.   Electronically Signed   By: Misty Stanley M.D.   On: 01/19/2014 18:35    Scheduled Meds: . enoxaparin (LOVENOX) injection  40 mg Subcutaneous Q24H  . piperacillin-tazobactam (ZOSYN)  IV  3.375 g Intravenous 3 times per day  . sodium chloride  3 mL Intravenous Q12H  . vancomycin  1,000 mg Intravenous Q12H   Continuous Infusions: . sodium chloride      Active Problems:   Cellulitis of female breast   Cellulitis of breast    Time spent: 40 minutes   [place] Pager [phone]. If 8PM-8AM, please contact night-coverage at [email or web address], password [facility] 01/20/2014, 11:02 AM  LOS: 2 days

## 2014-01-21 LAB — B. BURGDORFI ANTIBODIES: B BURGDORFERI AB IGG+ IGM: 0.55 {ISR}

## 2014-01-21 LAB — VANCOMYCIN, TROUGH: Vancomycin Tr: 9.1 ug/mL — ABNORMAL LOW (ref 10.0–20.0)

## 2014-01-21 MED ORDER — DOXYCYCLINE HYCLATE 50 MG PO CAPS: 50.0000 mg | ORAL_CAPSULE | Freq: Two times a day (BID) | ORAL | Status: AC

## 2014-01-21 MED ORDER — SODIUM CHLORIDE 0.9 % IV SOLN
1250.0000 mg | Freq: Two times a day (BID) | INTRAVENOUS | Status: DC
  Administered 2014-01-21: 1250 mg via INTRAVENOUS
  Filled 2014-01-21 (×2): qty 1250

## 2014-01-21 MED ORDER — HYDROXYZINE HCL 25 MG PO TABS: 25.0000 mg | ORAL_TABLET | Freq: Four times a day (QID) | ORAL | Status: DC | PRN

## 2014-01-21 MED ORDER — LEVOFLOXACIN 500 MG PO TABS: 500.0000 mg | ORAL_TABLET | Freq: Every day | ORAL | Status: AC

## 2014-01-21 MED ORDER — POTASSIUM CHLORIDE CRYS ER 20 MEQ PO TBCR
40.0000 meq | EXTENDED_RELEASE_TABLET | Freq: Once | ORAL | Status: AC
  Administered 2014-01-21: 40 meq via ORAL
  Filled 2014-01-21: qty 2

## 2014-01-21 NOTE — Discharge Instructions (Signed)
Patient can go back to work on 01/28/14

## 2014-01-21 NOTE — Discharge Summary (Signed)
Physician Discharge Summary  Dominique Lane MRN: [id number] DOB/AGE: 1960-10-21 53 y.o.  PCP: Unice Cobble, MD   Admit date: 01/18/2014 Discharge date: 01/21/2014  Discharge Diagnoses:      Cellulitis of female breast   Cellulitis of breast  Follow up recommendations If you need a followup on lyme antibody titer which is pending at this time Follow CBC, BMP in one week     Medication List         CORICIDIN HBP FLU PO  Take 2 tablets by mouth daily as needed (for flu symptoms).     doxycycline 50 MG capsule  Commonly known as:  VIBRAMYCIN  Take 1 capsule (50 mg total) by mouth 2 (two) times daily.     hydrOXYzine 25 MG tablet  Commonly known as:  ATARAX/VISTARIL  Take 1 tablet (25 mg total) by mouth every 6 (six) hours as needed for itching.     ibuprofen 200 MG tablet  Commonly known as:  ADVIL,MOTRIN  Take 800 mg by mouth every 6 (six) hours as needed for headache or moderate pain.     RED YEAST RICE PO  Take 1 tablet by mouth 2 (two) times daily.        Discharge Condition: Stable Disposition: 04-Intermediate Care Facility   Consults: None   Significant Diagnostic Studies: X-ray Chest Pa And Lateral   01/18/2014   CLINICAL DATA:  Fever for 1 day with weakness nausea and right chest soreness  EXAM: CHEST  2 VIEW  COMPARISON:  09/26/2008  FINDINGS: Heart size and vascular pattern are normal. There is mild atelectatic change at the left base. On the right, there is obliquely oriented opacity in the mid to lower lung zone medially. There is depression of the horizontal fissure on the right. There is no pleural effusion.  IMPRESSION: Bilateral airspace disease new from 2010. This appears most consistent with atelectasis on the left. There is likely also atelectatic change on the right, but there is more extensive opacity in the medial right lung base and pneumonia is suspected. Recommend radiographic follow-up after appropriate therapy to ensure resolution  of bibasilar infiltrates.   Electronically Signed   By: Skipper Cliche M.D.   On: 01/18/2014 20:04   [place] Unlisted Procedure Breast  01/19/2014   CLINICAL DATA:  Right breast redness  EXAM: ULTRASOUND OF THE right BREAST  COMPARISON:  No comparison studies available.  FINDINGS: Ultrasound is performed, showing edema within the subcutaneous tissues. The area of concern is in the right periareolar region. There is no underlying focal or organized fluid collection to suggest the presence of a drainable abscess.  IMPRESSION: Edema within the soft tissues, compatible with infection/cellulitis. No discrete organized or focal fluid collection to suggest abscess.   Electronically Signed   By: Misty Stanley M.D.   On: 01/19/2014 18:35       Microbiology: Recent Results (from the past 240 hour(s))  CULTURE, BLOOD (ROUTINE X 2)     Status: None   Collection Time    01/18/14  1:19 PM      Result Value Ref Range Status   Specimen Description BLOOD LEFT ANTECUBITAL   Final   Special Requests BOTTLES DRAWN AEROBIC AND ANAEROBIC [facility]   Final   Culture  Setup Time     Final   Value: 01/18/2014 17:30     Performed at [facility]   Culture     Final   Value:  BLOOD CULTURE RECEIVED NO GROWTH TO DATE CULTURE WILL BE HELD FOR 5 DAYS BEFORE ISSUING A FINAL NEGATIVE REPORT     Performed at [facility]   Report Status PENDING   Incomplete  CULTURE, BLOOD (ROUTINE X 2)     Status: None   Collection Time    01/18/14  1:33 PM      Result Value Ref Range Status   Specimen Description BLOOD LEFT HAND   Final   Special Requests     Final   Value: BOTTLES DRAWN AEROBIC AND ANAEROBIC 5CC BLUE, 2CC RED   Culture  Setup Time     Final   Value: 01/18/2014 17:30     Performed at [facility]   Culture     Final   Value:        BLOOD CULTURE RECEIVED NO GROWTH TO DATE CULTURE WILL BE HELD FOR 5 DAYS BEFORE ISSUING A FINAL NEGATIVE REPORT     Performed at [facility]   Report  Status PENDING   Incomplete     Labs: Results for orders placed during the hospital encounter of 01/18/14 (from the past 48 hour(s))  CBC     Status: Abnormal   Collection Time    01/20/14  4:44 AM      Result Value Ref Range   WBC 6.8  4.0 - 10.5 K/uL   RBC 3.58 (*) 3.87 - 5.11 MIL/uL   Hemoglobin 10.9 (*) 12.0 - 15.0 g/dL   HCT 33.4 (*) 36.0 - 46.0 %   MCV 93.3  78.0 - 100.0 fL   MCH 30.4  26.0 - 34.0 pg   MCHC 32.6  30.0 - 36.0 g/dL   RDW 12.7  11.5 - 15.5 %   Platelets 154  150 - 400 K/uL  COMPREHENSIVE METABOLIC PANEL     Status: Abnormal   Collection Time    01/20/14  4:44 AM      Result Value Ref Range   Sodium 143  137 - 147 mEq/L   Potassium 3.2 (*) 3.7 - 5.3 mEq/L   Chloride 104  96 - 112 mEq/L   CO2 26  19 - 32 mEq/L   Glucose, Bld 99  70 - 99 mg/dL   BUN 5 (*) 6 - 23 mg/dL   Creatinine, Ser 0.75  0.50 - 1.10 mg/dL   Calcium 8.5  8.4 - 10.5 mg/dL   Total Protein 6.3  6.0 - 8.3 g/dL   Albumin 3.0 (*) 3.5 - 5.2 g/dL   AST 12  0 - 37 U/L   ALT 13  0 - 35 U/L   Alkaline Phosphatase 89  39 - 117 U/L   Total Bilirubin 0.4  0.3 - 1.2 mg/dL   GFR calc non Af Amer >90  >90 mL/min   GFR calc Af Amer >90  >90 mL/min   Comment: (NOTE)     The eGFR has been calculated using the CKD EPI equation.     This calculation has not been validated in all clinical situations.     eGFR's persistently <90 mL/min signify possible Chronic Kidney     Disease.   Anion gap 13  5 - 15  VANCOMYCIN, TROUGH     Status: Abnormal   Collection Time    01/21/14  5:24 AM      Result Value Ref Range   Vancomycin Tr 9.1 (*) 10.0 - 20.0 ug/mL     HPI :* 53 year old female with a  history of heart murmur, hyperlipidemia who presents to urgent care with complaints of feeling weak, but myalgias and somnolent for the last 1-1/2 days, symptoms started at 3 PM yesterday when the patient thought that she had a flu and found to have increased erythema and redness around her right breast. She does not  recall any injury or any trauma to her right breast. She was treated for allergic contact dermatitis due to poison ivy on 7/4. She had a mammogram one year ago which was normal  In the ED the patient was found to be tachycardic and febrile with a fever of 100.8 the patient is being admitted for IV antibiotics and possible sepsis.   HOSPITAL COURSE:  Cellulitis of the right breast  Patient initiated on on vancomycin and Zosyn for cellulitis as well as pneumonia Blood culture no growth so far Patient transition to oral doxycycline for another 10 days Ultrasound of the right breast showed Edema within the soft tissues, compatible with infection/cellulitis.  No discrete organized or focal fluid collection to suggest abscess.  Follow blood culture  Leukocytosis improved , repeat CBC in one week  Pneumonia  X-ray shows bilateral airspace disease  Suspected right lung base pneumonia  The add levofloxacin 500 mg x5 days    Sinus Tachycardia secondary to the above  Likely secondary to possible SIRS  Maintained on telemetry  Improved with IV hydration   Rash  Patient has an eczematous dermatitis not typical of Lyme disease  Recent poison ivy exposure for which he presented to the ER Continue hydroxyzine for itching,  As per patient request have ordered Lyme titer , pending       Discharge Exam: * Blood pressure 118/71, pulse 68, temperature 98.4 F (36.9 C), temperature source Oral, resp. rate 17, height 5' 10.5" (1.791 m), weight 89.449 kg (197 lb 3.2 oz), SpO2 97.00%.  Cardiovascular: Normal rate, regular rhythm, normal heart sounds and intact distal pulses.  Pulmonary/Chest: Effort normal and breath sounds normal. No respiratory distress.  Abdominal: Soft. Normal appearance and bowel sounds are normal. She exhibits no distension. There is no tenderness.  Musculoskeletal: She exhibits no edema and no tenderness.  Neurological: She is alert. No cranial nerve deficit.           Discharge Instructions   Diet - low sodium heart healthy    Complete by:  As directed      Diet - low sodium heart healthy    Complete by:  As directed      Increase activity slowly    Complete by:  As directed      Increase activity slowly    Complete by:  As directed            Follow-up Information   Follow up with Unice Cobble, MD. Schedule an appointment as soon as possible for a visit in 1 week.   Specialty:  Internal Medicine   Contact information:   [address] [address] [phone]       Signed: Reyne Dumas 01/21/2014, 2:22 PM

## 2014-01-21 NOTE — Progress Notes (Signed)
Dominique Lane to be D/C'd Home per MD order.  Discussed with the patient and all questions fully answered.    Medication List         CORICIDIN HBP FLU PO  Take 2 tablets by mouth daily as needed (for flu symptoms).     doxycycline 50 MG capsule  Commonly known as:  VIBRAMYCIN  Take 1 capsule (50 mg total) by mouth 2 (two) times daily.     hydrOXYzine 25 MG tablet  Commonly known as:  ATARAX/VISTARIL  Take 1 tablet (25 mg total) by mouth every 6 (six) hours as needed for itching.     ibuprofen 200 MG tablet  Commonly known as:  ADVIL,MOTRIN  Take 800 mg by mouth every 6 (six) hours as needed for headache or moderate pain.     levofloxacin 500 MG tablet  Commonly known as:  LEVAQUIN  Take 1 tablet (500 mg total) by mouth daily.     RED YEAST RICE PO  Take 1 tablet by mouth 2 (two) times daily.        VVS. Redness on right breast, otherwise no breakdown on skin noted.  IV catheter discontinued intact. Site without signs and symptoms of complications. Dressing and pressure applied.  An After Visit Summary was printed and given to the patient.  D/c education completed with patient/family including follow up instructions, medication list, d/c activities limitations if indicated, with other d/c instructions as indicated by MD - patient able to verbalize understanding, all questions fully answered.   Patient instructed to return to ED, call 911, or call MD for any changes in condition.   Patient escorted via [place], and D/C home via private auto.  Delman Cheadle 01/21/2014 2:37 PM

## 2014-01-21 NOTE — Progress Notes (Signed)
ANTIBIOTIC CONSULT NOTE - Pharmacy  Pharmacy Consult for Vancomycin, Zosyn  Indication: Cellulitis   Allergies  Allergen Reactions  . Cephalosporins Hives    All over the body.  . Naproxen Sodium Anaphylaxis    Patient Measurements: Height: 5' 10.5" (179.1 cm) Weight: 197 lb 3.2 oz (89.449 kg) IBW/kg (Calculated) : 69.65 Adjusted Body Weight: n/a   Vital Signs: Temp: 97.7 F (36.5 C) (07/27 0531) Temp src: Oral (07/27 0531) BP: 96/61 mmHg (07/27 0531) Pulse Rate: 67 (07/27 0531) Intake/Output from previous day: 07/26 0701 - 07/27 0700 In: 756.3 [I.V.:506.3; IV Piggyback:250] Out: -  Intake/Output from this shift:    Labs:  Recent Labs  01/18/14 1305 01/20/14 0444  WBC 13.4* 6.8  HGB 12.9 10.9*  PLT 167 154  CREATININE 0.69 0.75   Estimated Creatinine Clearance: 97.3 ml/min (by C-G formula based on Cr of 0.75).  Recent Labs  01/21/14 0524  VANCOTROUGH 9.1*     Assessment: 23 YOF sent to the ED from Urgent care for further management and treatment of weakness and right breast cellulitis. D#4 of antibiotics. WBC wnl. Pt is now afebrile. CrCl ~ 95 mL/min. Vancomycin trough collected today was sub-therapeutic.   Cultures: 7/24 Blood Cx x2>> ngtd 7/26 B. Burgdorfi >> In process   7/27: VT 9.1 on Vanc 1 gm IV Q 12 hours   Goal of Therapy:  Vancomycin trough level 10-15 mcg/ml  Plan:  -Continue Zosyn 3.375 gm IV Q 8 hours  -Increase Vancomycin to 1250 mg IV Q 12 hours  -Monitor CBC, renal fx, cultures and patient's clinical progress -Repeat VT at steady state   Albertina Parr, PharmD.  Clinical Pharmacist Pager [phone]

## 2014-01-24 LAB — CULTURE, BLOOD (ROUTINE X 2)
Culture: NO GROWTH
Culture: NO GROWTH

## 2014-01-28 ENCOUNTER — Other Ambulatory Visit (INDEPENDENT_AMBULATORY_CARE_PROVIDER_SITE_OTHER): Payer: BC Managed Care – PPO

## 2014-01-28 ENCOUNTER — Ambulatory Visit (INDEPENDENT_AMBULATORY_CARE_PROVIDER_SITE_OTHER): Payer: BC Managed Care – PPO | Admitting: Internal Medicine

## 2014-01-28 ENCOUNTER — Encounter: Payer: Self-pay | Admitting: Internal Medicine

## 2014-01-28 ENCOUNTER — Ambulatory Visit (INDEPENDENT_AMBULATORY_CARE_PROVIDER_SITE_OTHER)
Admission: RE | Admit: 2014-01-28 | Discharge: 2014-01-28 | Disposition: A | Discharge status: Home / Self Care | Payer: BC Managed Care – PPO | Source: Ambulatory Visit | Attending: Internal Medicine | Admitting: Internal Medicine

## 2014-01-28 VITALS — BP 122/88 | HR 87 | Temp 97.9°F | Wt 193.8 lb

## 2014-01-28 DIAGNOSIS — N61 Mastitis without abscess: Secondary | ICD-10-CM

## 2014-01-28 DIAGNOSIS — R5381 Other malaise: Secondary | ICD-10-CM

## 2014-01-28 DIAGNOSIS — E876 Hypokalemia: Secondary | ICD-10-CM

## 2014-01-28 DIAGNOSIS — D649 Anemia, unspecified: Secondary | ICD-10-CM

## 2014-01-28 DIAGNOSIS — E8809 Other disorders of plasma-protein metabolism, not elsewhere classified: Secondary | ICD-10-CM

## 2014-01-28 DIAGNOSIS — R5383 Other fatigue: Secondary | ICD-10-CM

## 2014-01-28 DIAGNOSIS — J9819 Other pulmonary collapse: Secondary | ICD-10-CM

## 2014-01-28 DIAGNOSIS — J9811 Atelectasis: Secondary | ICD-10-CM | POA: Insufficient documentation

## 2014-01-28 LAB — TSH: TSH: 1.71 u[IU]/mL (ref 0.35–4.50)

## 2014-01-28 LAB — BASIC METABOLIC PANEL
BUN: 16 mg/dL (ref 6–23)
CO2: 24 mEq/L (ref 19–32)
CREATININE: 0.9 mg/dL (ref 0.4–1.2)
Calcium: 8.9 mg/dL (ref 8.4–10.5)
Chloride: 101 mEq/L (ref 96–112)
GFR: 69.94 mL/min (ref >60.00)
GLUCOSE: 115 mg/dL — AB (ref 70–99)
POTASSIUM: 3.9 meq/L (ref 3.5–5.1)
Sodium: 134 mEq/L — ABNORMAL LOW (ref 135–145)

## 2014-01-28 LAB — CBC WITH DIFFERENTIAL/PLATELET
BASOS ABS: 0 10*3/uL (ref 0.0–0.1)
Basophils Relative: 0.4 % (ref 0.0–3.0)
EOS PCT: 1.6 % (ref 0.0–5.0)
Eosinophils Absolute: 0.1 10*3/uL (ref 0.0–0.7)
HEMATOCRIT: 38.7 % (ref 36.0–46.0)
Hemoglobin: 13.2 g/dL (ref 12.0–15.0)
LYMPHS ABS: 2 10*3/uL (ref 0.7–4.0)
Lymphocytes Relative: 32.3 % (ref 12.0–46.0)
MCHC: 34.1 g/dL (ref 30.0–36.0)
MCV: 89.4 fl (ref 78.0–100.0)
Monocytes Absolute: 0.4 10*3/uL (ref 0.1–1.0)
Monocytes Relative: 7.1 % (ref 3.0–12.0)
NEUTROS PCT: 58.6 % (ref 43.0–77.0)
Neutro Abs: 3.6 10*3/uL (ref 1.4–7.7)
PLATELETS: 344 10*3/uL (ref 150.0–400.0)
RBC: 4.33 Mil/uL (ref 3.87–5.11)
RDW: 13.2 % (ref 11.5–15.5)
WBC: 6.1 10*3/uL (ref 4.0–10.5)

## 2014-01-28 LAB — IBC PANEL
IRON: 73 ug/dL (ref 42–145)
Saturation Ratios: 19.5 % — ABNORMAL LOW (ref 20.0–50.0)
TRANSFERRIN: 267.2 mg/dL (ref 212.0–360.0)

## 2014-01-28 LAB — VITAMIN B12: Vitamin B-12: 333 pg/mL (ref 211–911)

## 2014-01-28 NOTE — Progress Notes (Signed)
Pre visit review using our clinic review tool, if applicable. No additional management support is needed unless otherwise documented below in the visit note. 

## 2014-01-28 NOTE — Progress Notes (Signed)
   Subjective:    Patient ID: Lauretta Chester, female    DOB: 24-May-1961, 53 y.o.   MRN: [id number]  HPI  She been followed at the urgent care for poison oak over the upper extremity and face. She was sent from urgent care 01/19/12 with cellulitis of the right breast and possible pneumonia.  She had removed a tick from the left axilla recently but Lyme disease or other tickborne illnesses were ruled out. Sepsis was also ruled out by negative blood cultures  She received vancomycin Zosyn while hospitalized and was switched to doxycycline for 10 days. She is completing that course at this time  She believes that the redness and pain in the right breast have improved significantly. Additionally a cough has improved and is nonproductive. Appetite is improving but she has profound fatigue.  After being up for several hours she must lie down. She does not feel well enough to complete activities of daily living.  Stools are loose but becoming more firm since discharge  Hospital records were reviewed. Potassium was 3.2; albumin 3.0; and hemoglobin/hematocrit 10.9/33.4.  Last TSH was in 2010.  The chest x-ray was reviewed with her along with her lab studies. The chest x-ray suggests atelectasis rather  Pneumonia.  Review of Systems  She denies fever, chills, sweats, or purulent secretions  She has no associated diarrhea, melena, or rectal bleeding  She also denies dysuria, pyuria, or hematuria.       Objective:   Physical Exam Pertinent positive findings include: There is bland minimal erythema around the right breast. This area does not blanch to pressure. There is no increased temperature.  General appearance :adequately nourished; in no distress. Eyes: No conjunctival inflammation or scleral icterus is present. Oral exam: Dental hygiene is good. Lips and gums are healthy appearing.There is no oropharyngeal erythema or exudate noted.  Heart:  Normal rate and regular rhythm. S1 and S2  normal without gallop, murmur, click, rub or other extra sounds   Lungs:Chest clear to auscultation; no wheezes, rhonchi,rales ,or rubs present.No increased work of breathing.  Abdomen: bowel sounds normal, soft and non-tender without masses, organomegaly or hernias noted.  No guarding or rebound. No flank tenderness to percussion. Skin:Warm & dry.  Intact without suspicious lesions or rashes ; no jaundice or tenting Lymphatic: No lymphadenopathy is noted about the head, neck, axilla             Assessment & Plan:  #1 cellulitis; subjectively & clinically improving  #2 atelectasis bilaterally, left lower lobe greater than right. Followup film will be performed  #3 anemia  #4 low albumin  #5 hypokalemia  #6 fatrigue  See orders and after visit summary.

## 2014-01-28 NOTE — Patient Instructions (Addendum)
Please remain out of work until 02/04/2014.   Your next office appointment will be determined based upon review of your pending labs& Xrays. Those instructions will be transmitted to you through My Chart  OR  by mail;whichever process is your choice to receive results & recommendations . Followup as needed for your acute issue. Please report any significant change in your symptoms. Abumin reflect nutrition; low fat  or plant protein (Ex soy) sources from [employer] [facility]  are recommended as supplements.Dose should not exceed per package label. Ensure or Boost options also.

## 2014-01-28 NOTE — Progress Notes (Signed)
   Subjective:    Patient ID: Dominique Lane, female    DOB: 22-Jul-1960, 53 y.o.   MRN: [id number]  HPI Patient is following up from the hospital after being sent to the ER from Urgent Care. Admit date 01/18/14 D/c date 01/21/14. She was diagnosed with cellulitis of right breast and pneumonia.   They ruled out lyme disease (because of tick removal from left axilla recently) and sepsis (neg. Blood cultures).   She received Vancomycin and Zosyn in the hospital IV, switched to 10 days of Doxycycline PO. She is still taking the course and compliant.   Today she feels that the redness, breast pain, and coughing has improved. Her appetite is better, but is still experiencing some fatigue. She states she will be up and around at home for 2-3 hours and then have to lie down. She does not nap, but does not feel good enough to be doing daily activities. Her cough has improved to a dry non-productive occasional cough. She states she is having loose, but increasingly firmer stools since d/c from the hospital.   Breast redness on the right is mild and approximately 14 cm length wise and 10 cm top to bottom. She is non-tender and it is not warm to touch.   Hypokalemia, anemia, and low albumin were of concern on labs. Will redraw today.   Review of Systems Positive for fatigue and non-productive cough  Denies fevers, chills, sweats, constipation, diarrhea, blood in stool/urine, epistaxis, coughing up blood.  Denies productive cough, chest pain, new rash, myalgias, muscle weakness, and SOB.     Objective:   Physical Exam        Assessment & Plan:

## 2014-01-29 ENCOUNTER — Telehealth: Payer: Self-pay

## 2014-01-29 ENCOUNTER — Ambulatory Visit: Payer: BC Managed Care – PPO

## 2014-01-29 DIAGNOSIS — R7309 Other abnormal glucose: Secondary | ICD-10-CM

## 2014-01-29 LAB — HEMOGLOBIN A1C: Hgb A1c MFr Bld: 6.1 % (ref 4.6–6.5)

## 2014-01-29 NOTE — Telephone Encounter (Signed)
Message copied by Shelly Coss on Tue Jan 29, 2014  8:06 AM ------      Message from: Hendricks Limes      Created: Mon Jan 28, 2014  6:02 PM       Please add A1c (790.29)       ------

## 2014-01-29 NOTE — Telephone Encounter (Signed)
Request for add on lab has been faxed to lab

## 2014-02-05 ENCOUNTER — Telehealth: Payer: Self-pay

## 2014-02-05 NOTE — Telephone Encounter (Signed)
Caller name:Chelsea Relation to BW:[employer] Call back [phone] Pharmacy:  Reason for call: Vikki Ports called to see if we had received Short Term Disability Forms from them

## 2014-02-22 ENCOUNTER — Other Ambulatory Visit: Payer: Self-pay | Admitting: Internal Medicine

## 2014-02-22 ENCOUNTER — Other Ambulatory Visit (INDEPENDENT_AMBULATORY_CARE_PROVIDER_SITE_OTHER): Payer: BC Managed Care – PPO

## 2014-02-22 ENCOUNTER — Ambulatory Visit (INDEPENDENT_AMBULATORY_CARE_PROVIDER_SITE_OTHER): Payer: BC Managed Care – PPO | Admitting: Internal Medicine

## 2014-02-22 ENCOUNTER — Encounter: Payer: Self-pay | Admitting: Internal Medicine

## 2014-02-22 VITALS — BP 102/78 | HR 100 | Temp 98.2°F | Wt 190.0 lb

## 2014-02-22 DIAGNOSIS — R197 Diarrhea, unspecified: Secondary | ICD-10-CM

## 2014-02-22 DIAGNOSIS — R112 Nausea with vomiting, unspecified: Secondary | ICD-10-CM

## 2014-02-22 DIAGNOSIS — R509 Fever, unspecified: Secondary | ICD-10-CM

## 2014-02-22 LAB — BASIC METABOLIC PANEL
BUN: 18 mg/dL (ref 6–23)
CHLORIDE: 101 meq/L (ref 96–112)
CO2: 25 meq/L (ref 19–32)
Calcium: 9.3 mg/dL (ref 8.4–10.5)
Creatinine, Ser: 0.9 mg/dL (ref 0.4–1.2)
GFR: 69.92 mL/min (ref >60.00)
Glucose, Bld: 102 mg/dL — ABNORMAL HIGH (ref 70–99)
Potassium: 3.7 mEq/L (ref 3.5–5.1)
Sodium: 136 mEq/L (ref 135–145)

## 2014-02-22 LAB — CBC WITH DIFFERENTIAL/PLATELET
BASOS PCT: 0.2 % (ref 0.0–3.0)
Basophils Absolute: 0 10*3/uL (ref 0.0–0.1)
Eosinophils Absolute: 0 10*3/uL (ref 0.0–0.7)
Eosinophils Relative: 0.2 % (ref 0.0–5.0)
HEMATOCRIT: 42.3 % (ref 36.0–46.0)
Hemoglobin: 14.3 g/dL (ref 12.0–15.0)
LYMPHS ABS: 0.7 10*3/uL (ref 0.7–4.0)
Lymphocytes Relative: 8.6 % — ABNORMAL LOW (ref 12.0–46.0)
MCHC: 33.8 g/dL (ref 30.0–36.0)
MCV: 89.9 fl (ref 78.0–100.0)
MONO ABS: 0.4 10*3/uL (ref 0.1–1.0)
Monocytes Relative: 5 % (ref 3.0–12.0)
NEUTROS ABS: 6.7 10*3/uL (ref 1.4–7.7)
Platelets: 228 10*3/uL (ref 150.0–400.0)
RBC: 4.71 Mil/uL (ref 3.87–5.11)
RDW: 13.6 % (ref 11.5–15.5)
WBC: 7.8 10*3/uL (ref 4.0–10.5)

## 2014-02-22 MED ORDER — METRONIDAZOLE 500 MG PO TABS: 500.0000 mg | ORAL_TABLET | Freq: Three times a day (TID) | ORAL | Status: DC

## 2014-02-22 NOTE — Progress Notes (Signed)
   Subjective:    Patient ID: Dominique Lane, female    DOB: [birth year], 53 y.o.   MRN: [id number]  HPI  Her symptoms began last evening 02/21/14 as abdominal burning. Over the next 6 hours she had several scant small stools.  At approximately 9 PM she had nausea and vomiting voluminous  amounts of material  As of today she's had @ least 8 frankly watery stools.  She was found to have a temperature of 100.4 today @ work with low blood pressure .She was sent home.  She has had associated anorexia and chills.  She took ranitidine with only partial response to her GI burning.  She was on parenteral antibiotics for 3 days while hospitalized & subsequently  14 days of Levaquin as an outpatient for cellulitis of the breast  Colonoscopy is up-to-date.      Review of Systems   She specifically denies dysphagia; severe dyspepsia; hematemesis; dysuria;pyuria;or hematuria.     Objective:   Physical Exam  Pertinent positive findings include: She appears fatigued and weak but  in no acute distress . She exhibits an S4 gallop with a rate of 100.  General appearance :adequately nourished Eyes: No conjunctival inflammation or scleral icterus is present. Oral exam: Dental hygiene is good. Lips and gums are healthy appearing.There is no oropharyngeal erythema or exudate noted.  Heart:  regular rhythm. S1 and S2 normal without  murmur, click, rub or other extra sounds   Lungs:Chest clear to auscultation; no wheezes, rhonchi,rales ,or rubs present.No increased work of breathing.  Abdomen: bowel sounds normal, soft and non-tender without masses, organomegaly or hernias noted.  No guarding or rebound. No flank tenderness to percussion. Skin:Warm & dry.  Intact without suspicious lesions or rashes ; no jaundice or tenting Lymphatic: No lymphadenopathy is noted about the head, neck, axilla          Assessment & Plan:  #1 severe diarrhea  #2 fever  #3 hypotension  #4 nausea and  vomiting  The most likely process here would be C. difficile colitis following her prolonged antibiotics for cellulitis of the breast  Plan: See orders and recommendations

## 2014-02-22 NOTE — Patient Instructions (Signed)
Stay on clear liquids for 48-72 hours or until bowels are normal.This would include  jello, sherbert (NOT ice cream), Lipton's chicken noodle soup(NOT cream based soups),Gatorade Lite, flat Ginger ale (without High Fructose Corn Syrup),dry toast or crackers, baked potato.No milk , dairy or grease until bowels are formed. Align , a [employer] , daily if stools are loose. Immodium AD for frankly watery stool. Report increasing pain, fever or rectal bleeding

## 2014-02-22 NOTE — Progress Notes (Signed)
Pre visit review using our clinic review tool, if applicable. No additional management support is needed unless otherwise documented below in the visit note. 

## 2014-02-23 LAB — C. DIFFICILE GDH AND TOXIN A/B
C. DIFF TOXIN A/B: NOT DETECTED
C. difficile GDH: NOT DETECTED

## 2014-02-28 ENCOUNTER — Telehealth: Payer: Self-pay

## 2014-02-28 NOTE — Telephone Encounter (Signed)
Phone call to patient letting her know she can pick up her paperwork. It is at the front desk.

## 2014-04-16 ENCOUNTER — Ambulatory Visit: Payer: BC Managed Care – PPO | Admitting: Internal Medicine

## 2014-04-29 ENCOUNTER — Encounter: Payer: Self-pay | Admitting: Internal Medicine

## 2014-06-12 ENCOUNTER — Encounter: Payer: Self-pay | Admitting: Internal Medicine

## 2014-06-12 ENCOUNTER — Ambulatory Visit (INDEPENDENT_AMBULATORY_CARE_PROVIDER_SITE_OTHER): Payer: BC Managed Care – PPO | Admitting: Internal Medicine

## 2014-06-12 VITALS — BP 106/71 | HR 80 | Temp 97.5°F | Ht 71.0 in | Wt 195.1 lb

## 2014-06-12 DIAGNOSIS — R059 Cough, unspecified: Secondary | ICD-10-CM

## 2014-06-12 DIAGNOSIS — R7309 Other abnormal glucose: Secondary | ICD-10-CM

## 2014-06-12 DIAGNOSIS — R7303 Prediabetes: Secondary | ICD-10-CM | POA: Insufficient documentation

## 2014-06-12 DIAGNOSIS — R05 Cough: Secondary | ICD-10-CM

## 2014-06-12 MED ORDER — AZITHROMYCIN 250 MG PO TABS: ORAL_TABLET | ORAL | Status: DC

## 2014-06-12 MED ORDER — HYDROCODONE-HOMATROPINE 5-1.5 MG/5ML PO SYRP: 5.0000 mL | ORAL_SOLUTION | Freq: Every evening | ORAL | Status: DC | PRN

## 2014-06-12 NOTE — Assessment & Plan Note (Signed)
A1c 6.1 a few months ago, admits that there is room for improvement of her lifestyle. Plan: Come back in for 5 months, recheck A1c at that time, counseled about diet

## 2014-06-12 NOTE — Patient Instructions (Signed)
Rest, fluids , tylenol If  cough, take Mucinex DM twice a day as needed  If nasal  congestion use OTC Nasocort or Flonase : 2 nasal sprays on each side of the nose daily until you feel better  Take the antibiotic as prescribed  (zpack) Call if not gradually better over the next  10 days     If you need more information about a healthy diet, visit the  Association, it  is a great resource online at:  http://www.richard-flynn.net/   Please come back to the office in 4-5 months  for a physical exam. Come back fasting

## 2014-06-12 NOTE — Progress Notes (Signed)
Subjective:    Patient ID: Dominique Lane, female    DOB: December 01, 1960, 53 y.o.   MRN: [id number]  DOS:  06/12/2014 Type of visit - description : Transferring from dr Linna Darner Interval history: New patient to me, chart reviewed. Also, one-month history of cough with a very small amount of thin, white sputum. Had sinus congestion and discharge x  2 weeks ago, that is better but she continue with cough.  On chart review, she has a history of prediabetes, patient was aware of it, admits that she has gained some weight and needs to improve her diet  ROS Denies fever chills Currently without pain, congestion @the  sinuses or nasal discharge. No shortness of breath, felt some wheezing 2 days ago, no history of asthma, no   tobacco abuse. No nausea or vomiting except one time last week due to severe cough  Past Medical History  Diagnosis Date  . Fibroid   . Adenomyosis   . MVP (mitral valve prolapse)     Antibiotics required  . Heart murmur   . Hyperlipidemia   . LKGMWNUU(725.3)     Past Surgical History  Procedure Laterality Date  . Cholecystectomy    . Ovarian cyst removal      Right  . Tubal ligation    . Ganglion cyst excision    . Rotator cuff repair      twice on left and once on right  . Vaginal hysterectomy    . Colonoscopy  2011    Dr Amedeo Plenty  . Fractured mandible  1997    repair  . Laparoscopy N/A 09/13/2012    Procedure: LAPAROSCOPY OPERATIVE;  Surgeon: Terrance Mass, MD;  Location: [facility];  Service: Gynecology;  Laterality: N/A;  . Salpingoophorectomy Bilateral 09/13/2012    Procedure: SALPINGO OOPHORECTOMY;  Surgeon: Terrance Mass, MD;  Location: [facility];  Service: Gynecology;  Laterality: Bilateral;  . Laparoscopic lysis of adhesions N/A 09/13/2012    Procedure: LAPAROSCOPIC LYSIS OF ADHESIONS;  Surgeon: Terrance Mass, MD;  Location: [facility];  Service: Gynecology;  Laterality: N/A;  . Carpal tunnel release Bilateral     History   Social History  .  Marital Status: Divorced    Spouse Name: N/A    Number of Children: 2  . Years of Education: N/A   Occupational History  . [employer], production line    Social History Main Topics  . Smoking status: Never Smoker   . Smokeless tobacco: Never Used  . Alcohol Use: 0.0 oz/week     Comment: occasionally; < 2 glasseswine / month  . Drug Use: No  . Sexual Activity: Yes    Birth Control/ Protection: Surgical   Other Topics Concern  . Not on file   Social History Narrative   Household- pt and fiancee         Medication List       This list is accurate as of: 06/12/14 11:59 PM.  Always use your most recent med list.               azithromycin 250 MG tablet  Commonly known as:  ZITHROMAX Z-PAK  2 tabs a day the first day, then 1 tab a day x 4 days     HYDROcodone-homatropine 5-1.5 MG/5ML syrup  Commonly known as:  HYCODAN  Take 5 mLs by mouth at bedtime as needed for cough.     ibuprofen 200 MG tablet  Commonly known as:  ADVIL,MOTRIN  Take 800 mg by  mouth every 6 (six) hours as needed for headache or moderate pain.     RED YEAST RICE PO  Take 1 tablet by mouth 2 (two) times daily.           Objective:   Physical Exam BP 106/71 mmHg  Pulse 80  Temp(Src) 97.5 F (36.4 C) (Oral)  Ht 5\' 11"  (1.803 m)  Wt 195 lb 2 oz (88.508 kg)  BMI 27.23 kg/m2  SpO2 97% General -- alert, well-developed, NAD.   HEENT-- Not pale.  R Ear-- normal L ear-- normal Throat symmetric, no redness or discharge. Face symmetric, sinuses not tender to palpation. Nose slt congested. Lungs -- normal respiratory effort, no intercostal retractions, no accessory muscle use, and normal breath sounds.  Heart-- normal rate, regular rhythm, no murmur.  Extremities-- no pretibial edema bilaterally  Neurologic--  alert & oriented X3. Speech normal, gait appropriate for age, strength symmetric and appropriate for age.  Psych-- Cognition and judgment appear intact. Cooperative with normal attention  span and concentration. No anxious or depressed appearing. \     Assessment & Plan:  Cough, Persisting cough for one month, exam is benign, atypical infection? Plan: Z-Pak, see instructions

## 2014-06-12 NOTE — Progress Notes (Signed)
Pre visit review using our clinic review tool, if applicable. No additional management support is needed unless otherwise documented below in the visit note. 

## 2014-10-02 ENCOUNTER — Other Ambulatory Visit: Payer: Self-pay

## 2014-10-25 ENCOUNTER — Encounter: Payer: Self-pay | Admitting: Gynecology

## 2014-11-13 ENCOUNTER — Telehealth: Payer: Self-pay | Admitting: Internal Medicine

## 2014-11-13 NOTE — Telephone Encounter (Signed)
Pre Visit letter sent  °

## 2014-12-04 ENCOUNTER — Telehealth: Payer: Self-pay

## 2014-12-04 NOTE — Telephone Encounter (Signed)
LMOVM

## 2014-12-05 ENCOUNTER — Ambulatory Visit (INDEPENDENT_AMBULATORY_CARE_PROVIDER_SITE_OTHER): Payer: BLUE CROSS/BLUE SHIELD | Admitting: Internal Medicine

## 2014-12-05 ENCOUNTER — Encounter: Payer: Self-pay | Admitting: Internal Medicine

## 2014-12-05 VITALS — BP 124/62 | HR 63 | Temp 97.4°F | Ht 71.0 in | Wt 196.2 lb

## 2014-12-05 DIAGNOSIS — R7309 Other abnormal glucose: Secondary | ICD-10-CM

## 2014-12-05 DIAGNOSIS — Z23 Encounter for immunization: Secondary | ICD-10-CM | POA: Diagnosis not present

## 2014-12-05 DIAGNOSIS — R7303 Prediabetes: Secondary | ICD-10-CM

## 2014-12-05 DIAGNOSIS — Z0000 Encounter for general adult medical examination without abnormal findings: Secondary | ICD-10-CM | POA: Insufficient documentation

## 2014-12-05 LAB — BASIC METABOLIC PANEL
BUN: 14 mg/dL (ref 6–23)
CALCIUM: 9.4 mg/dL (ref 8.4–10.5)
CO2: 28 mEq/L (ref 19–32)
Chloride: 104 mEq/L (ref 96–112)
Creatinine, Ser: 0.86 mg/dL (ref 0.40–1.20)
GFR: 72.53 mL/min (ref >60.00)
GLUCOSE: 89 mg/dL (ref 70–99)
Potassium: 3.9 mEq/L (ref 3.5–5.1)
SODIUM: 137 meq/L (ref 135–145)

## 2014-12-05 LAB — LIPID PANEL
CHOLESTEROL: 206 mg/dL — AB (ref 0–200)
HDL: 47.8 mg/dL (ref >39.00)
LDL Cholesterol: 137 mg/dL — ABNORMAL HIGH (ref 0–99)
NONHDL: 158.2
TRIGLYCERIDES: 104 mg/dL (ref 0.0–149.0)
Total CHOL/HDL Ratio: 4
VLDL: 20.8 mg/dL (ref 0.0–40.0)

## 2014-12-05 LAB — HEMOGLOBIN A1C: Hgb A1c MFr Bld: 5.6 % (ref 4.6–6.5)

## 2014-12-05 NOTE — Assessment & Plan Note (Addendum)
tdap today  Colon cancer screening: Colonoscopy with Dr. Amedeo Plenty in 2011 was negative, he recommended a follow-up colonoscopy 5-10 days. Patient would like to have another colonoscopy, referral sent Female care per Dr. Toney Rakes Negative mammogram [phone] Labs: BMP, FLP, CBC, vitamin D. hiv  Other issues Prediabetes, check A1c, she is doing great with diet Cough, see review of systems, recommend to avoid triggers (tea)  and let me know if she's not better Migraine headaches, Many years history of headaches, at some point was told they were migraines, they are associated with nausea. They are infrequent every 4-6 months. She likes to do the less invasive treatment, we agree on : rest, Motrin. If headaches changes get more intense or severe she will let me know.

## 2014-12-05 NOTE — Progress Notes (Signed)
Pre visit review using our clinic review tool, if applicable. No additional management support is needed unless otherwise documented below in the visit note. 

## 2014-12-05 NOTE — Patient Instructions (Signed)
Get your blood work before you leave    

## 2014-12-05 NOTE — Progress Notes (Signed)
Subjective:    Patient ID: Dominique Lane, female    DOB: 01-12-1961, 54 y.o.   MRN: [id number]  DOS:  12/05/2014 Type of visit - description : cpx Interval history:  doing great with diet, counting calorie,has lost few  pounds   Review of Systems Constitutional: No fever. No chills. No unexplained wt changes. No unusual sweats  HEENT: No dental problems, no ear discharge, no facial swelling, no voice changes. No eye discharge, no eye  redness , no  intolerance to light   Respiratory: No wheezing , no  difficulty breathing.   occasional cough on and off for years,  Sometimes with clear sutum, increase by drinking tea? Better w/ a tsp of vinegar Cardiovascular: No CP, no leg swelling , no  Palpitations  GI: no nausea, no vomiting, no diarrhea , no  abdominal pain.  No blood in the stools. No dysphagia, no odynophagia    Endocrine: No polyphagia, no polyuria , no polydipsia  GU: No dysuria, gross hematuria, difficulty urinating. No urinary urgency, no frequency.  Musculoskeletal: No joint swellings or unusual aches or pains  Skin: No change in the color of the skin, palor , no  Rash  Allergic, immunologic: No environmental allergies , no  food allergies  Neurological: No dizziness no  syncope.  No diplopia, no slurred, no slurred speech, no motor deficits, no facial  Numbness. Many years h/o HA, occ w/ nausea   Hematological: No enlarged lymph nodes, no easy bruising , no unusual bleedings  Psychiatry: No suicidal ideas, no hallucinations, no beavior problems, no confusion.  No unusual/severe anxiety, no depression    Past Medical History  Diagnosis Date  . Fibroid   . Adenomyosis   . MVP (mitral valve prolapse)     Antibiotics required  . Heart murmur   . Hyperlipidemia   . FBPZWCHE(527.7)     Past Surgical History  Procedure Laterality Date  . Cholecystectomy    . Ovarian cyst removal      Right  . Tubal ligation    . Ganglion cyst excision    . Rotator  cuff repair      twice on left and once on right  . Vaginal hysterectomy    . Colonoscopy  2011    Dr Amedeo Plenty  . Fractured mandible  1997    repair  . Laparoscopy N/A 09/13/2012    Procedure: LAPAROSCOPY OPERATIVE;  Surgeon: Terrance Mass, MD;  Location: [facility];  Service: Gynecology;  Laterality: N/A;  . Salpingoophorectomy Bilateral 09/13/2012    Procedure: SALPINGO OOPHORECTOMY;  Surgeon: Terrance Mass, MD;  Location: [facility];  Service: Gynecology;  Laterality: Bilateral;  . Laparoscopic lysis of adhesions N/A 09/13/2012    Procedure: LAPAROSCOPIC LYSIS OF ADHESIONS;  Surgeon: Terrance Mass, MD;  Location: [facility];  Service: Gynecology;  Laterality: N/A;  . Carpal tunnel release Bilateral     History   Social History  . Marital Status: Divorced    Spouse Name: N/A  . Number of Children: 2  . Years of Education: N/A   Occupational History  . [employer], production line    Social History Main Topics  . Smoking status: Never Smoker   . Smokeless tobacco: Never Used  . Alcohol Use: 0.0 oz/week     Comment: occasionally; < 2 glasseswine / month  . Drug Use: No  . Sexual Activity: Yes    Birth Control/ Protection: Surgical   Other Topics Concern  . Not on file  Social History Narrative   Household- pt and fiancee      Family History  Problem Relation Age of Onset  . Heart disease Mother     ?  Marland Kitchen Hypertension Sister   . CAD Neg Hx   . Colon cancer Neg Hx   . Ovarian cancer Sister 73  . Bladder Cancer Father 61  . Lymphoma Paternal Aunt   . Leukemia Paternal Grandfather   . Breast cancer Neg Hx        Medication List       This list is accurate as of: 12/05/14  3:38 PM.  Always use your most recent med list.               ibuprofen 200 MG tablet  Commonly known as:  ADVIL,MOTRIN  Take 800 mg by mouth every 6 (six) hours as needed for headache or moderate pain.     MULTIVITAMIN ADULT PO  Take 1 tablet by mouth daily.     RED YEAST RICE PO  Take 1  tablet by mouth 2 (two) times daily.           Objective:   Physical Exam BP 124/62 mmHg  Pulse 63  Temp(Src) 97.4 F (36.3 C) (Oral)  Ht 5\' 11"  (1.803 m)  Wt 196 lb 4 oz (89.018 kg)  BMI 27.38 kg/m2  SpO2 98%  General:   Well developed, well nourished . NAD.  Neck:  Full range of motion. Supple. No  thyromegaly , normal carotid pulse HEENT:  Normocephalic . Face symmetric, atraumatic Lungs:  CTA B Normal respiratory effort, no intercostal retractions, no accessory muscle use. Heart: RRR,  no murmur.  No pretibial edema bilaterally  Abdomen:  Not distended, soft, non-tender. No rebound or rigidity. No mass,organomegaly Skin: Exposed areas without rash. Not pale. Not jaundice Neurologic:  alert & oriented X3.  Speech normal, gait appropriate for age and unassisted Strength symmetric and appropriate for age.  Psych: Cognition and judgment appear intact.  Cooperative with normal attention span and concentration.  Behavior appropriate. No anxious or depressed appearing.       Assessment & Plan:

## 2014-12-06 LAB — HIV ANTIBODY (ROUTINE TESTING W REFLEX): HIV 1&2 Ab, 4th Generation: NONREACTIVE

## 2014-12-09 LAB — VITAMIN D 1,25 DIHYDROXY
Vitamin D 1, 25 (OH)2 Total: 44 pg/mL (ref 18–72)
Vitamin D2 1, 25 (OH)2: <8 pg/mL
Vitamin D3 1, 25 (OH)2: 44 pg/mL

## 2015-01-02 ENCOUNTER — Telehealth: Payer: Self-pay | Admitting: Internal Medicine

## 2015-01-02 NOTE — Telephone Encounter (Signed)
Please advise 

## 2015-01-02 NOTE — Telephone Encounter (Signed)
Caller name: Mckaila Relation to pt: Call back number: [phone] Pharmacy:  Reason for call:   Patient is wanting to know why Dr. Larose Kells wanted to check her Vid D level at last visit? She states that her insurance is not covering this because this is not a routine lab for a cpe lab

## 2015-01-02 NOTE — Telephone Encounter (Signed)
Vitamin D deficiency is very common, since she is in her  65s and at risk for  osteoporosis is important to know that her vitamin D is okay. Most insurance will cover the test.

## 2015-01-03 NOTE — Telephone Encounter (Signed)
Spoke with Pt, informed her that Dr. Larose Kells likes to check vitamin D levels after the age of 90 especially in females because of hysterectomies or menopause. Pt verbalized understanding but stated she wished she was told that he was checking that. I informed her most insurances do pay for Vitamin D labs once yearly which she has never had checked here before. Informed her that we have no way of knowing which insurance will pay and will not pay for certain labs unfortunately. Pt again verbalized frustration, which I again apologized.

## 2015-01-07 ENCOUNTER — Ambulatory Visit (INDEPENDENT_AMBULATORY_CARE_PROVIDER_SITE_OTHER): Payer: BLUE CROSS/BLUE SHIELD | Admitting: Family Medicine

## 2015-01-07 VITALS — BP 138/78 | HR 81 | Temp 98.8°F | Resp 18 | Ht 70.0 in | Wt 202.0 lb

## 2015-01-07 DIAGNOSIS — R05 Cough: Secondary | ICD-10-CM

## 2015-01-07 DIAGNOSIS — J069 Acute upper respiratory infection, unspecified: Secondary | ICD-10-CM | POA: Diagnosis not present

## 2015-01-07 DIAGNOSIS — R059 Cough, unspecified: Secondary | ICD-10-CM

## 2015-01-07 MED ORDER — BENZONATATE 100 MG PO CAPS
ORAL_CAPSULE | ORAL | Status: DC
Start: 2015-01-07 — End: 2015-06-19

## 2015-01-07 MED ORDER — ALBUTEROL SULFATE HFA 108 (90 BASE) MCG/ACT IN AERS: INHALATION_SPRAY | RESPIRATORY_TRACT | Status: DC

## 2015-01-07 MED ORDER — PROMETHAZINE-CODEINE 6.25-10 MG/5ML PO SYRP: ORAL_SOLUTION | ORAL | Status: DC

## 2015-01-07 NOTE — Patient Instructions (Signed)
Drink plenty of fluids and get enough rest  Take the promethazine with codeine cough syrup 1 teaspoon or 2 teaspoons every 6 hours as needed for cough. This is a little bit sedating, and is best used at nighttime  Take the benzonatate cough pills one or 2 pills 3 times daily as needed for cough. This is nonsedating and can be taken when you are going to work  Use the inhaler 2 inhalations every 4-6 hours as directed for wheezing and coughing  If you're getting worse and start running fevers coughing up large amounts of mucus please return for recheck

## 2015-01-07 NOTE — Progress Notes (Signed)
  Subjective:  Patient ID: Dominique Lane, female    DOB: Oct 05, 1960  Age: 54 y.o. MRN: [id number]  54 year old lady who is here with a cough that's been going on for the last 3 days. She had more problems with cough today, feeling short of breath with it. Her fianc has been ill with same thing. She does not smoke. She has not been coughing up any phlegm. She has had a slight headache headache and thought she was going to do ear pain but it went away. She's not been febrile. She works at a [employer], but it did not work today in his office tomorrow also. She has had a little bit of wheezing.     Objective:   Alert and oriented in no acute distress. She is coughing some. Her TMs are normal. Throat clear. Neck supple without nodes. Chest is clear to auscultation until she forcibly exhales which triggers wheezing and coughing. Heart regular without murmurs.  Assessment & Plan:   Assessment: Cough Viral upper respiratory infection  Plan: Patient Instructions  Drink plenty of fluids and get enough rest  Take the promethazine with codeine cough syrup 1 teaspoon or 2 teaspoons every 6 hours as needed for cough. This is a little bit sedating, and is best used at nighttime  Take the benzonatate cough pills one or 2 pills 3 times daily as needed for cough. This is nonsedating and can be taken when you are going to work  Use the inhaler 2 inhalations every 4-6 hours as directed for wheezing and coughing  If you're getting worse and start running fevers coughing up large amounts of mucus please return for recheck       Dayquan Buys, MD 01/07/2015

## 2015-01-09 ENCOUNTER — Ambulatory Visit (INDEPENDENT_AMBULATORY_CARE_PROVIDER_SITE_OTHER): Payer: BLUE CROSS/BLUE SHIELD | Admitting: Family Medicine

## 2015-01-09 ENCOUNTER — Ambulatory Visit (INDEPENDENT_AMBULATORY_CARE_PROVIDER_SITE_OTHER): Payer: BLUE CROSS/BLUE SHIELD

## 2015-01-09 ENCOUNTER — Telehealth: Payer: Self-pay

## 2015-01-09 VITALS — BP 132/90 | HR 106 | Temp 99.6°F | Resp 18 | Ht 70.0 in | Wt 201.6 lb

## 2015-01-09 DIAGNOSIS — R059 Cough, unspecified: Secondary | ICD-10-CM

## 2015-01-09 DIAGNOSIS — R509 Fever, unspecified: Secondary | ICD-10-CM

## 2015-01-09 DIAGNOSIS — J189 Pneumonia, unspecified organism: Secondary | ICD-10-CM | POA: Diagnosis not present

## 2015-01-09 DIAGNOSIS — R05 Cough: Secondary | ICD-10-CM

## 2015-01-09 MED ORDER — LEVOFLOXACIN 500 MG PO TABS: 500.0000 mg | ORAL_TABLET | Freq: Every day | ORAL | Status: DC

## 2015-01-09 NOTE — Progress Notes (Signed)
[facility] [address] [place] [address] [phone]  Date:  01/09/2015   Name:  Dominique Lane   DOB:  03/01/1961   MRN:  [id number]  PCP:  Kathlene November, MD    Chief Complaint: Follow-up   History of Present Illness:  Dominique Lane is a 54 y.o. very pleasant female patient who presents with the following:  She was seen here a couple of days ago with a cough for 3 days . She was treated supportively for a viral URI.  However over the last day she has taken a turn for the worse.   She feels "like I can't breathe if I talk too loud."  She is coughing and has fevers and chills She was not able to get the inhaler as it was too expensive She worked all day today and felt very tired, she has aches and chills and barley lasted the day at work She took ibuprofen at 10:00 am  Patient Active Problem List   Diagnosis Date Noted  . Annual physical exam 12/05/2014  . Prediabetes 06/12/2014  . Atelectasis 01/28/2014  . MRSA carrier 09/08/2012  . Endometriosis 08/23/2012  . Family history of ovarian cancer 08/23/2012  . MVP (mitral valve prolapse)   . HYPERLIPIDEMIA 04/10/2007  . GERD 04/10/2007    Past Medical History  Diagnosis Date  . Fibroid   . Adenomyosis   . MVP (mitral valve prolapse)     Antibiotics required  . Heart murmur   . Hyperlipidemia   . ZCHYIFOY(774.1)     Past Surgical History  Procedure Laterality Date  . Cholecystectomy    . Ovarian cyst removal      Right  . Tubal ligation    . Ganglion cyst excision    . Rotator cuff repair      twice on left and once on right  . Vaginal hysterectomy    . Colonoscopy  2011    Dr Amedeo Plenty  . Fractured mandible  1997    repair  . Laparoscopy N/A 09/13/2012    Procedure: LAPAROSCOPY OPERATIVE;  Surgeon: Terrance Mass, MD;  Location: [facility];  Service: Gynecology;  Laterality: N/A;  . Salpingoophorectomy Bilateral 09/13/2012    Procedure: SALPINGO OOPHORECTOMY;  Surgeon: Terrance Mass,  MD;  Location: [facility];  Service: Gynecology;  Laterality: Bilateral;  . Laparoscopic lysis of adhesions N/A 09/13/2012    Procedure: LAPAROSCOPIC LYSIS OF ADHESIONS;  Surgeon: Terrance Mass, MD;  Location: [facility];  Service: Gynecology;  Laterality: N/A;  . Carpal tunnel release Bilateral     History  Substance Use Topics  . Smoking status: Never Smoker   . Smokeless tobacco: Never Used  . Alcohol Use: 0.0 oz/week     Comment: occasionally; < 2 glasseswine / month    Family History  Problem Relation Age of Onset  . Heart disease Mother     ?  Marland Kitchen Hypertension Sister   . CAD Neg Hx   . Colon cancer Neg Hx   . Ovarian cancer Sister 59  . Bladder Cancer Father 78  . Lymphoma Paternal Aunt   . Leukemia Paternal Grandfather   . Breast cancer Neg Hx     Allergies  Allergen Reactions  . Cephalosporins Hives    All over the body.  . Naproxen Sodium Anaphylaxis  . Hydromet [Hydrocodone-Homatropine] Itching    Medication list has been reviewed and updated.  Current Outpatient Prescriptions on File Prior to Visit  Medication  Sig Dispense Refill  . benzonatate (TESSALON PERLES) 100 MG capsule Take 1-2 caps 3 times daily if needed for daytime cough 20 capsule 0  . Dextromethorphan-Guaifenesin (TUSSIN DM PO) Take by mouth.    Marland Kitchen ibuprofen (ADVIL,MOTRIN) 200 MG tablet Take 800 mg by mouth every 6 (six) hours as needed for headache or moderate pain.    Marland Kitchen loratadine (CLARITIN) 10 MG tablet Take 10 mg by mouth daily.    . mafenide (SULFAMYLON) 5 % packet Apply 1 packet topically daily.    . Multiple Vitamins-Minerals (MULTIVITAMIN ADULT PO) Take 1 tablet by mouth daily.    . promethazine-codeine (PHENERGAN WITH CODEINE) 6.25-10 MG/5ML syrup Take 1-2 teaspoons every 6 hours as needed for cough 120 mL 0  . Red Yeast Rice Extract (RED YEAST RICE PO) Take 1 tablet by mouth 2 (two) times daily.    Marland Kitchen albuterol (PROVENTIL HFA;VENTOLIN HFA) 108 (90 BASE) MCG/ACT inhaler Used 2 inhalations as  directed every 4-6 hours for coughing and wheezing (Patient not taking: Reported on 01/09/2015) 1 Inhaler 1   No current facility-administered medications on file prior to visit.    Review of Systems:  As per HPI- otherwise negative.   Physical Examination: Filed Vitals:   01/09/15 1946  BP: 132/90  Pulse: 101  Temp: 102.4 F (39.1 C)  Resp: 18   Filed Vitals:   01/09/15 1946  Height: 5\' 10"  (1.778 m)  Weight: 201 lb 9.6 oz (91.445 kg)   Body mass index is 28.93 kg/(m^2). Ideal Body Weight: Weight in (lb) to have BMI = 25: 173.9  GEN: WDWN, NAD, Non-toxic, A & O x 3, appears mildly ill HEENT: Atraumatic, Normocephalic. Neck supple. No masses, No LAD.  Bilateral TM wnl, oropharynx normal.  PEERL,EOMI.   Ears and Nose: No external deformity. CV: RRR, No M/G/R. No JVD. No thrill. No extra heart sounds. PULM: CTA B, no wheezes, crackles, rhonchi. No retractions. No resp. distress. No accessory muscle use. ABD: S, NT, ND EXTR: No c/c/e NEURO Normal gait.  PSYCH: Normally interactive. Conversant. Not depressed or anxious appearing.  Calm demeanor.   [facility] reading (PRIMARY) by  Dr. Lorelei Pont. CXR: patchy left sided infiltrate   FINDINGS: There is patchy infiltrate in the left lower lobe. Lungs elsewhere clear. Heart size and pulmonary vascularity are normal. No adenopathy. There is slight degenerative change in the thoracic spine.  IMPRESSION: Patchy infiltrate left lower lobe. Lungs otherwise clear. Followup PA and lateral chest radiographs recommended in 3-4 weeks following trial of antibiotic therapy to ensure resolution and exclude underlying malignancy.    Assessment and Plan: CAP (community acquired pneumonia) - Plan: levofloxacin (LEVAQUIN) 500 MG tablet  Cough - Plan: DG Chest 2 View  Other specified fever - Plan: DG Chest 2 View  Treat for CAP with levaquin.  She will follow-up if not better in the next couple of days.  Note for work tomorrow.  See patient  instructions for more details.      Signed Lamar Blinks, MD

## 2015-01-09 NOTE — Patient Instructions (Signed)
You have pneumonia- take the levaquin antibiotic as directed for 10 days. Start today!   Let me know if you not feel better in the next couple of days- Sooner if worse.  It is fine to use ibuprofen and/ or tylenol as needed for fever and aches

## 2015-01-09 NOTE — Telephone Encounter (Signed)
Patient went to the pharmacy to pick up her prescriptions, and she said the cost of albuterol (PROVENTIL HFA;VENTOLIN HFA) 108 (90 BASE) MCG/ACT inhaler was $75.  She said she could not afford that.  She would like a cheaper alternative, if possible.  Please advise.  Thank you.  CB#: [phone]

## 2015-01-10 NOTE — Telephone Encounter (Signed)
Please advise 

## 2015-01-10 NOTE — Telephone Encounter (Signed)
Please call the pharmacy. Is there a less expensive albuterol for this patient?  If no, I am happy to prescribe that instead. If not, please advise the patient. She may want to see if a different pharmacy (like [facility] or [facility], or even [facility]) has a lower cost.

## 2015-01-14 NOTE — Telephone Encounter (Signed)
Left detailed message for pt to call her pharmacy. I spoke with the pharmacist and he will change to a cheaper alternative.

## 2015-02-10 ENCOUNTER — Encounter: Payer: Self-pay | Admitting: Gynecology

## 2015-06-19 ENCOUNTER — Telehealth: Payer: Self-pay | Admitting: Internal Medicine

## 2015-06-19 ENCOUNTER — Ambulatory Visit (INDEPENDENT_AMBULATORY_CARE_PROVIDER_SITE_OTHER): Payer: BLUE CROSS/BLUE SHIELD | Admitting: Family Medicine

## 2015-06-19 VITALS — BP 100/80 | HR 104 | Temp 98.2°F | Resp 20 | Ht 71.0 in | Wt 200.4 lb

## 2015-06-19 DIAGNOSIS — R112 Nausea with vomiting, unspecified: Secondary | ICD-10-CM | POA: Diagnosis not present

## 2015-06-19 DIAGNOSIS — K5289 Other specified noninfective gastroenteritis and colitis: Secondary | ICD-10-CM | POA: Diagnosis not present

## 2015-06-19 DIAGNOSIS — E860 Dehydration: Secondary | ICD-10-CM

## 2015-06-19 LAB — POCT CBC
Granulocyte percent: 88.8 %G — AB (ref 37–80)
HCT, POC: 40.8 % (ref 37.7–47.9)
HEMOGLOBIN: 14.1 g/dL (ref 12.2–16.2)
LYMPH, POC: 0.6 (ref 0.6–3.4)
MCH, POC: 30.4 pg (ref 27–31.2)
MCHC: 34.5 g/dL (ref 31.8–35.4)
MCV: 87.9 fL (ref 80–97)
MID (cbc): 0.2 (ref 0–0.9)
MPV: 6.5 fL (ref 0–99.8)
PLATELET COUNT, POC: 213 10*3/uL (ref 142–424)
POC Granulocyte: 6.9 (ref 2–6.9)
POC LYMPH PERCENT: 8.3 %L — AB (ref 10–50)
POC MID %: 2.9 % (ref 0–12)
RBC: 4.64 M/uL (ref 4.04–5.48)
RDW, POC: 12.7 %
WBC: 7.8 10*3/uL (ref 4.6–10.2)

## 2015-06-19 LAB — POCT URINALYSIS DIP (MANUAL ENTRY)
BILIRUBIN UA: NEGATIVE
Glucose, UA: NEGATIVE
Ketones, POC UA: NEGATIVE
Leukocytes, UA: NEGATIVE
Nitrite, UA: NEGATIVE
Protein Ur, POC: 30 — AB
Urobilinogen, UA: 1
pH, UA: 5.5

## 2015-06-19 MED ORDER — ONDANSETRON 8 MG PO TBDP: 8.0000 mg | ORAL_TABLET | Freq: Three times a day (TID) | ORAL | Status: DC | PRN

## 2015-06-19 MED ORDER — ONDANSETRON 4 MG PO TBDP
8.0000 mg | ORAL_TABLET | Freq: Once | ORAL | Status: AC
  Administered 2015-06-19: 8 mg via ORAL

## 2015-06-19 NOTE — Progress Notes (Signed)
Subjective:    Patient ID: Dominique Lane, female    DOB: [birth year], 54 y.o.   MRN: [id number]  06/19/2015  Diarrhea; Fever; Headache; and Emesis   HPI This 54 y.o. female presents for evaluation of diarrhea and vomiting.  Onset last night; 24 hours ago.  Low grade fever Tmax 101.0.  +chills; no sweats.  +HA  No URI symptoms.  +diarrhea last night every hour for six hours; non-bloody non-mucous.  Diarrhea today one episode firming up today.  Vomiting started at 8:00pm last night as well; vomited every hour over night until 2:00am; then vomited x 3 today.  Nauseated mild.  Last vomit five hours ago. Worse with movement.  Sleeping most of the day.  Drank ginger ale two sixteen ounces.  Vomit yellow, non-bloody.  No abdominal pain.  No recent abx.  No recent camping. Just came off cruise Sunday to [place], [place], etc.  On the way home, felt gassy abut then improved.  Burning in throat.  No sick contacts.  Dizziness last night; more weak today than dizzy.  Colonoscopy age 77; repeat age 34.   Review of Systems  Constitutional: Positive for fever, chills and diaphoresis. Negative for fatigue.  HENT: Negative for congestion, ear pain, postnasal drip and sore throat.   Eyes: Negative for visual disturbance.  Respiratory: Negative for cough and shortness of breath.   Cardiovascular: Negative for chest pain, palpitations and leg swelling.  Gastrointestinal: Positive for nausea, vomiting and diarrhea. Negative for abdominal pain and constipation.  Endocrine: Negative for cold intolerance, heat intolerance, polydipsia, polyphagia and polyuria.  Neurological: Positive for dizziness and headaches. Negative for tremors, seizures, syncope, facial asymmetry, speech difficulty, weakness, light-headedness and numbness.    Past Medical History  Diagnosis Date  . Fibroid   . Adenomyosis   . MVP (mitral valve prolapse)     Antibiotics required  . Heart murmur   . Hyperlipidemia   . [id number])      Past Surgical History  Procedure Laterality Date  . Cholecystectomy    . Ovarian cyst removal      Right  . Tubal ligation    . Ganglion cyst excision    . Rotator cuff repair      twice on left and once on right  . Vaginal hysterectomy    . Colonoscopy  2011    Dr Amedeo Plenty  . Fractured mandible  1997    repair  . Laparoscopy N/A 09/13/2012    Procedure: LAPAROSCOPY OPERATIVE;  Surgeon: Terrance Mass, MD;  Location: [facility];  Service: Gynecology;  Laterality: N/A;  . Salpingoophorectomy Bilateral 09/13/2012    Procedure: SALPINGO OOPHORECTOMY;  Surgeon: Terrance Mass, MD;  Location: [facility];  Service: Gynecology;  Laterality: Bilateral;  . Laparoscopic lysis of adhesions N/A 09/13/2012    Procedure: LAPAROSCOPIC LYSIS OF ADHESIONS;  Surgeon: Terrance Mass, MD;  Location: [facility];  Service: Gynecology;  Laterality: N/A;  . Carpal tunnel release Bilateral    Allergies  Allergen Reactions  . Cephalosporins Hives    All over the body.  . Naproxen Sodium Anaphylaxis  . Hydromet [Hydrocodone-Homatropine] Itching   Current Outpatient Prescriptions  Medication Sig Dispense Refill  . ondansetron (ZOFRAN-ODT) 8 MG disintegrating tablet Take 1 tablet (8 mg total) by mouth every 8 (eight) hours as needed for nausea. 20 tablet 0   No current facility-administered medications for this visit.   Social History   Social History  . Marital Status: Divorced    Spouse Name:  N/A  . Number of Children: 2  . Years of Education: N/A   Occupational History  . [employer], production line    Social History Main Topics  . Smoking status: Never Smoker   . Smokeless tobacco: Never Used  . Alcohol Use: 0.0 oz/week     Comment: occasionally; < 2 glasseswine / month  . Drug Use: No  . Sexual Activity: Yes    Birth Control/ Protection: Surgical   Other Topics Concern  . Not on file   Social History Narrative   Household- pt and fiancee    Family History  Problem Relation Age of Onset   . Heart disease Mother     ?  Marland Kitchen Hypertension Sister   . CAD Neg Hx   . Colon cancer Neg Hx   . Ovarian cancer Sister 32  . Bladder Cancer Father 30  . Lymphoma Paternal Aunt   . Leukemia Paternal Grandfather   . Breast cancer Neg Hx        Objective:    BP 100/80 mmHg  Pulse 104  Temp(Src) 98.2 F (36.8 C) (Oral)  Resp 20  Ht 5\' 11"  (1.803 m)  Wt 200 lb 6.4 oz (90.901 kg)  BMI 27.96 kg/m2  SpO2 96% Physical Exam  Constitutional: She is oriented to person, place, and time. She appears well-developed and well-nourished. No distress.  HENT:  Head: Normocephalic and atraumatic.  Right Ear: External ear normal.  Left Ear: External ear normal.  Nose: Nose normal.  Mouth/Throat: Oropharynx is clear and moist.  MM slightly dry.  Eyes: Conjunctivae and EOM are normal. Pupils are equal, round, and reactive to light.  Neck: Normal range of motion. Neck supple. Carotid bruit is not present. No thyromegaly present.  Cardiovascular: Normal rate, regular rhythm, normal heart sounds and intact distal pulses.  Exam reveals no gallop and no friction rub.   No murmur heard. Pulmonary/Chest: Effort normal and breath sounds normal. She has no wheezes. She has no rales.  Abdominal: Soft. Bowel sounds are normal. She exhibits no distension and no mass. There is no tenderness. There is no rebound and no guarding.  Lymphadenopathy:    She has no cervical adenopathy.  Neurological: She is alert and oriented to person, place, and time. No cranial nerve deficit.  Skin: Skin is warm and dry. No rash noted. She is not diaphoretic. No erythema. No pallor.  Psychiatric: She has a normal mood and affect. Her behavior is normal.   Results for orders placed or performed in visit on 06/19/15  POCT urinalysis dipstick  Result Value Ref Range   Color, UA yellow yellow   Clarity, UA clear clear   Glucose, UA negative negative   Bilirubin, UA negative negative   Ketones, POC UA negative negative    Spec Grav, UA >=1.030    Blood, UA small (A) negative   pH, UA 5.5    Protein Ur, POC =30 (A) negative   Urobilinogen, UA 1.0    Nitrite, UA Negative Negative   Leukocytes, UA Negative Negative  POCT CBC  Result Value Ref Range   WBC 7.8 4.6 - 10.2 K/uL   Lymph, poc 0.6 0.6 - 3.4   POC LYMPH PERCENT 8.3 (A) 10 - 50 %L   MID (cbc) 0.2 0 - 0.9   POC MID % 2.9 0 - 12 %M   POC Granulocyte 6.9 2 - 6.9   Granulocyte percent 88.8 (A) 37 - 80 %G   RBC 4.64 4.04 - 5.48  M/uL   Hemoglobin 14.1 12.2 - 16.2 g/dL   HCT, POC 40.8 37.7 - 47.9 %   MCV 87.9 80 - 97 fL   MCH, POC 30.4 27 - 31.2 pg   MCHC 34.5 31.8 - 35.4 g/dL   RDW, POC 12.7 %   Platelet Count, POC 213 142 - 424 K/uL   MPV 6.5 0 - 99.8 fL   ZOFRAN 8MG  ODT ADMINISTERED.  IV FLUIDS -- 2 LITERS NORMAL SALINE ADMINISTERED.     Assessment & Plan:   1. Dehydration   2. Non-intractable vomiting with nausea, unspecified vomiting type   3. Other noninfectious gastroenteritis    -New. -s/p iv hydration 2 liters in office. -rx for Zofran provided. -BRAT diet. -RTC 72 hours for stool studies if persistent diarrhea. -RTC if inability to keep down liquids.   Orders Placed This Encounter  Procedures  . Comprehensive metabolic panel  . Care order/instruction    Orthostatic vital signs, document in EPIC    Scheduling Instructions:     Orthostatic vital signs, document in EPIC  . POCT urinalysis dipstick  . POCT CBC   Meds ordered this encounter  Medications  . ondansetron (ZOFRAN-ODT) disintegrating tablet 8 mg    Sig:   . ondansetron (ZOFRAN-ODT) 8 MG disintegrating tablet    Sig: Take 1 tablet (8 mg total) by mouth every 8 (eight) hours as needed for nausea.    Dispense:  20 tablet    Refill:  0    No Follow-up on file.    Dwayna Kentner Elayne Guerin, M.D. [facility] [address] [place], [place]  [address] [phone] phone [phone] fax

## 2015-06-19 NOTE — Telephone Encounter (Signed)
Follow up call placed to patient. States she is waiting for family to transport her to [facility] at this time.

## 2015-06-19 NOTE — Telephone Encounter (Signed)
[facility] Day - Client TELEPHONE ADVICE RECORD [facility] Patient Name: Dominique Lane DOB: 06/28/1961 Initial Comment Caller states that she has vomiting and diarrhea Nurse Assessment Nurse: Germain Osgood RN, Opal Sidles Date/Time Eilene Ghazi Time): 06/19/2015 4:03:44 PM Confirm and document reason for call. If symptomatic, describe symptoms. ---Caller reports she has been vomiting since 8 PM last night Just cam off a cruise on Sunday. Low grade temp elevation Has the patient traveled out of the country within the last 30 days? ---Yes Where have you traveled? ([place] for Ebola and Ebola guideline, [place], [place] for MERS) ---[place] Does the patient have any new or worsening symptoms? ---Yes Will a triage be completed? ---Yes Related visit to physician within the last 2 weeks? ---No Does the PT have any chronic conditions? (i.e. diabetes, asthma, etc.) ---No Is this a behavioral health or substance abuse call? ---No Guidelines Guideline Title Affirmed Question Affirmed Notes Vomiting [1] Drinking very little AND [2] dehydration suspected (e.g., no urine > 12 hours, very dry mouth, very lightheaded) Final Disposition User Go to ED Now (or PCP triage) Germain Osgood, RN, Jane Referrals [facility] Disagree/Comply: Comply

## 2015-06-19 NOTE — Patient Instructions (Signed)

## 2015-06-20 LAB — COMPREHENSIVE METABOLIC PANEL
ALBUMIN: 4.1 g/dL (ref 3.6–5.1)
ALK PHOS: 102 U/L (ref 33–130)
ALT: 14 U/L (ref 6–29)
AST: 16 U/L (ref 10–35)
BUN: 14 mg/dL (ref 7–25)
CHLORIDE: 97 mmol/L — AB (ref 98–110)
CO2: 25 mmol/L (ref 20–31)
Calcium: 8.8 mg/dL (ref 8.6–10.4)
Creat: 0.77 mg/dL (ref 0.50–1.05)
Glucose, Bld: 137 mg/dL — ABNORMAL HIGH (ref 65–99)
POTASSIUM: 3.7 mmol/L (ref 3.5–5.3)
Sodium: 131 mmol/L — ABNORMAL LOW (ref 135–146)
TOTAL PROTEIN: 6.8 g/dL (ref 6.1–8.1)
Total Bilirubin: 0.9 mg/dL (ref 0.2–1.2)

## 2015-08-19 ENCOUNTER — Other Ambulatory Visit: Payer: Self-pay | Admitting: Urology

## 2015-08-19 DIAGNOSIS — R319 Hematuria, unspecified: Secondary | ICD-10-CM | POA: Insufficient documentation

## 2015-08-19 DIAGNOSIS — R1030 Lower abdominal pain, unspecified: Secondary | ICD-10-CM

## 2015-09-02 ENCOUNTER — Ambulatory Visit
Admission: RE | Admit: 2015-09-02 | Discharge: 2015-09-02 | Disposition: A | Discharge status: Home / Self Care | Payer: Managed Care, Other (non HMO) | Source: Ambulatory Visit | Attending: Urology | Admitting: Urology

## 2015-09-02 DIAGNOSIS — R319 Hematuria, unspecified: Secondary | ICD-10-CM

## 2015-09-02 DIAGNOSIS — R1030 Lower abdominal pain, unspecified: Secondary | ICD-10-CM

## 2015-09-02 MED ORDER — IOPAMIDOL (ISOVUE-300) INJECTION 61%
125.0000 mL | Freq: Once | INTRAVENOUS | Status: AC | PRN
  Administered 2015-09-02: 125 mL via INTRAVENOUS

## 2015-11-10 ENCOUNTER — Ambulatory Visit (INDEPENDENT_AMBULATORY_CARE_PROVIDER_SITE_OTHER): Payer: Managed Care, Other (non HMO) | Admitting: Gynecology

## 2015-11-10 ENCOUNTER — Encounter: Payer: Self-pay | Admitting: Gynecology

## 2015-11-10 VITALS — BP 114/78 | Ht 70.75 in | Wt 201.0 lb

## 2015-11-10 DIAGNOSIS — Z780 Asymptomatic menopausal state: Secondary | ICD-10-CM

## 2015-11-10 DIAGNOSIS — Z01419 Encounter for gynecological examination (general) (routine) without abnormal findings: Secondary | ICD-10-CM

## 2015-11-10 NOTE — Progress Notes (Signed)
Dominique Lane October 02, 1960 [id number]   History:    55 y.o.  for annual gyn exam with no complaints today. Last time patient was seen was in 2014 which she had her final postop exam. Her history was as follows:  Patient was status post diagnostic laparoscopy with bilateral salpingo-oophorectomy as well as intraoperative general surgical consultation Neldon Mc MD) as a result of chronic right lower quadrant pain and family history of ovarian cancer.   Findings from surgery as follows:  Evidence of previous hysterectomy. Sigmoid colon/omentum adhered to the vaginal cuff. Right fallopian tube normal appearance right ovary small 2 cm clear cyst. Small Prolene suture from previous surgery noted on right ovary. Left fallopian tube and ovary normal. Both inguinal rings were without defect and appendix appeared to be normal. Smooth liver surface. Omentum with no seeding noted. General surgeon Dr. Neldon Mc was present during the inspection of the inguinal ring and appendix and liver surface.   Pathology report as follows:  Ovary and fallopian tube, right  - BENIGN OVARY WITH FOLLICULAR TYPE CYST; NEGATIVE FOR ATYPIA OR MALIGNANCY.  - BENIGN FALLOPIAN TUBE WITH SEROUS TYPE PARATUBAL CYST; NEGATIVE FOR ATYPIA OR  MALIGNANCY.  2. Ovary and fallopian tube, left  - BENIGN OVARY WITH FOLLICULAR TYPE CYST; NEGATIVE FOR ATYPIA OR MALIGNANCY.  - BENIGN FALLOPIAN TUBE; NEGATIVE FOR ATYPIA OR MALIGNANCY.  3. Peritoneum, biopsy  - BENIGN FAT WITH HEMORRHAGE SEE COMMENT.  Microscopic Comment  3. Associated with benign fat are fragments of benign smooth muscle. Morphologically this favors sampling of  peritoneal smooth muscle and not a neoplastic proliferation (i.e. Leiomyoma  Patient is doing well and states that she has not felt this good in such a long time.  She stated that her sister who had the ovarian cancer has refused to have the BRCA one BRCA2 testing . She states  and not only her sister both several aunts had ovarian cancer and a grandmother with breast cancer. Patient herself had the BRCA one BRCA2 testing on April 11 results as follows:  Her PCP has been doing her blood work. Her last bone density study in 2005 was normal. Prior to her hysterectomy patient had had normal Pap smears. She reports a normal colonoscopy in 2011. Patient on no hormone replacement therapy  Past medical history,surgical history, family history and social history were all reviewed and documented in the EPIC chart.  Gynecologic History No LMP recorded. Patient has had a hysterectomy. Contraception: status post hysterectomy Last Pap: 2011. Results were: normal Last mammogram: 2016. Results were: normal  Obstetric History OB History  Gravida Para Term Preterm AB SAB TAB Ectopic Multiple Living  '2 2 2       2    '$ # Outcome Date GA Lbr Len/2nd Weight Sex Delivery Anes PTL Lv  2 Term           1 Term                ROS: A ROS was performed and pertinent positives and negatives are included in the history.  GENERAL: No fevers or chills. HEENT: No change in vision, no earache, sore throat or sinus congestion. NECK: No pain or stiffness. CARDIOVASCULAR: No chest pain or pressure. No palpitations. PULMONARY: No shortness of breath, cough or wheeze. GASTROINTESTINAL: No abdominal pain, nausea, vomiting or diarrhea, melena or bright red blood per rectum. GENITOURINARY: No urinary frequency, urgency, hesitancy or dysuria. MUSCULOSKELETAL: No joint or muscle pain, no back pain, no  recent trauma. DERMATOLOGIC: No rash, no itching, no lesions. ENDOCRINE: No polyuria, polydipsia, no heat or cold intolerance. No recent change in weight. HEMATOLOGICAL: No anemia or easy bruising or bleeding. NEUROLOGIC: No headache, seizures, numbness, tingling or weakness. PSYCHIATRIC: No depression, no loss of interest in normal activity or change in sleep pattern.     Exam: chaperone present  BP  114/78 mmHg  Ht 5' 10.75" (1.797 m)  Wt 201 lb (91.173 kg)  BMI 28.23 kg/m2  Body mass index is 28.23 kg/(m^2).  General appearance : Well developed well nourished female. No acute distress HEENT: Eyes: no retinal hemorrhage or exudates,  Neck supple, trachea midline, no carotid bruits, no thyroidmegaly Lungs: Clear to auscultation, no rhonchi or wheezes, or rib retractions  Heart: Regular rate and rhythm, no murmurs or gallops Breast:Examined in sitting and supine position were symmetrical in appearance, no palpable masses or tenderness,  no skin retraction, no nipple inversion, no nipple discharge, no skin discoloration, no axillary or supraclavicular lymphadenopathy Abdomen: no palpable masses or tenderness, no rebound or guarding Extremities: no edema or skin discoloration or tenderness  Pelvic:  Bartholin, Urethra, Skene Glands: Within normal limits             Vagina: No gross lesions or discharge  Cervix: Absent  Uterus  absent  Adnexa  Without masses or tenderness  Anus and perineum  normal   Rectovaginal  normal sphincter tone without palpated masses or tenderness             Hemoccult PCP provides     Assessment/Plan:  55 y.o. female for annual exam on no hormone replacement therapy is doing well. Normal exam today. Patient to schedule her mammogram and bone density study this year. We discussed importance of calcium vitamin D and weightbearing exercises for osteoporosis prevention. According to the new guidelines patient would no longer need Pap smears.   Terrance Mass MD, 10:50 AM 11/10/2015

## 2015-11-10 NOTE — Patient Instructions (Signed)

## 2016-01-19 ENCOUNTER — Telehealth: Payer: Self-pay | Admitting: Internal Medicine

## 2016-01-19 NOTE — Telephone Encounter (Signed)
Fine with me, September or later please.

## 2016-01-19 NOTE — Telephone Encounter (Signed)
Patient is requesting to transfer from [facility] to [facility].  Patient states she brought father in last week to see Dominique Lane and she asked Dominique Lane about this stating Dr. Sharlet Lane would take her on.  Please advise.

## 2016-01-20 NOTE — Telephone Encounter (Signed)
Will approve in PCP absence.

## 2016-01-20 NOTE — Telephone Encounter (Signed)
Got patient scheduled

## 2016-02-17 ENCOUNTER — Encounter: Payer: Managed Care, Other (non HMO) | Admitting: Internal Medicine

## 2016-03-02 ENCOUNTER — Other Ambulatory Visit (INDEPENDENT_AMBULATORY_CARE_PROVIDER_SITE_OTHER): Payer: BLUE CROSS/BLUE SHIELD

## 2016-03-02 ENCOUNTER — Ambulatory Visit (INDEPENDENT_AMBULATORY_CARE_PROVIDER_SITE_OTHER): Payer: BLUE CROSS/BLUE SHIELD | Admitting: Internal Medicine

## 2016-03-02 ENCOUNTER — Encounter: Payer: Self-pay | Admitting: Internal Medicine

## 2016-03-02 VITALS — BP 100/68 | HR 70 | Temp 98.0°F | Resp 12 | Ht 70.5 in | Wt 191.4 lb

## 2016-03-02 DIAGNOSIS — E782 Mixed hyperlipidemia: Secondary | ICD-10-CM | POA: Diagnosis not present

## 2016-03-02 DIAGNOSIS — Z0000 Encounter for general adult medical examination without abnormal findings: Secondary | ICD-10-CM | POA: Diagnosis not present

## 2016-03-02 LAB — COMPREHENSIVE METABOLIC PANEL
ALBUMIN: 4.4 g/dL (ref 3.5–5.2)
ALT: 19 U/L (ref 0–35)
AST: 20 U/L (ref 0–37)
Alkaline Phosphatase: 98 U/L (ref 39–117)
BUN: 13 mg/dL (ref 6–23)
CHLORIDE: 103 meq/L (ref 96–112)
CO2: 26 mEq/L (ref 19–32)
CREATININE: 0.87 mg/dL (ref 0.40–1.20)
Calcium: 9.2 mg/dL (ref 8.4–10.5)
GFR: 71.26 mL/min (ref >60.00)
GLUCOSE: 91 mg/dL (ref 70–99)
Potassium: 4.4 mEq/L (ref 3.5–5.1)
SODIUM: 136 meq/L (ref 135–145)
TOTAL PROTEIN: 7.3 g/dL (ref 6.0–8.3)
Total Bilirubin: 0.6 mg/dL (ref 0.2–1.2)

## 2016-03-02 LAB — LIPID PANEL
CHOLESTEROL: 230 mg/dL — AB (ref 0–200)
HDL: 47 mg/dL (ref >39.00)
LDL CALC: 154 mg/dL — AB (ref 0–99)
NONHDL: 183.35
Total CHOL/HDL Ratio: 5
Triglycerides: 149 mg/dL (ref 0.0–149.0)
VLDL: 29.8 mg/dL (ref 0.0–40.0)

## 2016-03-02 LAB — CBC
HCT: 40 % (ref 36.0–46.0)
Hemoglobin: 13.5 g/dL (ref 12.0–15.0)
MCHC: 33.8 g/dL (ref 30.0–36.0)
MCV: 89 fl (ref 78.0–100.0)
Platelets: 248 10*3/uL (ref 150.0–400.0)
RBC: 4.49 Mil/uL (ref 3.87–5.11)
RDW: 12.9 % (ref 11.5–15.5)
WBC: 5.6 10*3/uL (ref 4.0–10.5)

## 2016-03-02 NOTE — Progress Notes (Signed)
Pre visit review using our clinic review tool, if applicable. No additional management support is needed unless otherwise documented below in the visit note. 

## 2016-03-02 NOTE — Assessment & Plan Note (Signed)
Checking lipid panel, she declines treatment even if it is high at this time. Down 10 pounds from last year which should help.

## 2016-03-02 NOTE — Assessment & Plan Note (Signed)
Checking labs today, adjust as needed. Encouraged to resume her exercise regimen. Counseled on sun safety and mole surveillance as well as the dangers of distracted driving. She had colonoscopy 2011 with Dr. Amedeo Plenty and will need to get records (she states no problems, needed in 10 years). Mammogram up to date. Declines hep c screening at this time.

## 2016-03-02 NOTE — Progress Notes (Signed)
   Subjective:    Patient ID: Dominique Lane, female    DOB: 11/21/1960, 55 y.o.   MRN: [id number]  HPI The patient is a 55 YO female coming in for physical and switch PCPs. No new complaints.   PMH, [facility], social history reviewed and updated.   Review of Systems  Constitutional: Negative for activity change, appetite change, fatigue, fever and unexpected weight change.  HENT: Negative.   Eyes: Negative.   Respiratory: Negative for cough, chest tightness, shortness of breath and wheezing.   Cardiovascular: Negative for chest pain, palpitations and leg swelling.  Gastrointestinal: Negative for abdominal distention, abdominal pain, constipation, diarrhea and nausea.  Musculoskeletal: Negative.   Skin: Negative.   Neurological: Negative.   Psychiatric/Behavioral: Negative.       Objective:   Physical Exam  Constitutional: She is oriented to person, place, and time. She appears well-developed and well-nourished.  HENT:  Head: Normocephalic and atraumatic.  Eyes: EOM are normal.  Neck: Normal range of motion. No JVD present.  Cardiovascular: Normal rate and regular rhythm.   Pulmonary/Chest: Effort normal and breath sounds normal. No respiratory distress. She has no wheezes. She has no rales.  Abdominal: Soft. Bowel sounds are normal. She exhibits no distension. There is no tenderness. There is no rebound.  Musculoskeletal: She exhibits no edema.  Neurological: She is alert and oriented to person, place, and time. Coordination normal.  Skin: Skin is warm and dry.  Psychiatric: She has a normal mood and affect.   Vitals:   03/02/16 1030  BP: 100/68  Pulse: 70  Resp: 12  Temp: 98 F (36.7 C)  TempSrc: Oral  SpO2: 96%  Weight: 191 lb 6.4 oz (86.8 kg)  Height: 5' 10.5" (1.791 m)      Assessment & Plan:

## 2016-03-02 NOTE — Patient Instructions (Addendum)
We will check the labs today and call you back with the results.   Get back to exercising to keep up the good work with the health.   Health Maintenance, Female Adopting a healthy lifestyle and getting preventive care can go a long way to promote health and wellness. Talk with your health care provider about what schedule of regular examinations is right for you. This is a good chance for you to check in with your provider about disease prevention and staying healthy. In between checkups, there are plenty of things you can do on your own. Experts have done a lot of research about which lifestyle changes and preventive measures are most likely to keep you healthy. Ask your health care provider for more information. WEIGHT AND DIET  Eat a healthy diet  Be sure to include plenty of vegetables, fruits, low-fat dairy products, and lean protein.  Do not eat a lot of foods high in solid fats, added sugars, or salt.  Get regular exercise. This is one of the most important things you can do for your health.  Most adults should exercise for at least 150 minutes each week. The exercise should increase your heart rate and make you sweat (moderate-intensity exercise).  Most adults should also do strengthening exercises at least twice a week. This is in addition to the moderate-intensity exercise.  Maintain a healthy weight  Body mass index (BMI) is a measurement that can be used to identify possible weight problems. It estimates body fat based on height and weight. Your health care provider can help determine your BMI and help you achieve or maintain a healthy weight.  For females 11 years of age and older:   A BMI below 18.5 is considered underweight.  A BMI of 18.5 to 24.9 is normal.  A BMI of 25 to 29.9 is considered overweight.  A BMI of 30 and above is considered obese.  Watch levels of cholesterol and blood lipids  You should start having your blood tested for lipids and cholesterol at  57 years of age, then have this test every 5 years.  You may need to have your cholesterol levels checked more often if:  Your lipid or cholesterol levels are high.  You are older than 58 years of age.  You are at high risk for heart disease.  CANCER SCREENING   Lung Cancer  Lung cancer screening is recommended for adults 41-9 years old who are at high risk for lung cancer because of a history of smoking.  A yearly low-dose CT scan of the lungs is recommended for people who:  Currently smoke.  Have quit within the past 15 years.  Have at least a 30-pack-year history of smoking. A pack year is smoking an average of one pack of cigarettes a day for 1 year.  Yearly screening should continue until it has been 15 years since you quit.  Yearly screening should stop if you develop a health problem that would prevent you from having lung cancer treatment.  Breast Cancer  Practice breast self-awareness. This means understanding how your breasts normally appear and feel.  It also means doing regular breast self-exams. Let your health care provider know about any changes, no matter how small.  If you are in your 20s or 30s, you should have a clinical breast exam (CBE) by a health care provider every 1-3 years as part of a regular health exam.  If you are 81 or older, have a CBE every year. Also  consider having a breast X-ray (mammogram) every year.  If you have a family history of breast cancer, talk to your health care provider about genetic screening.  If you are at high risk for breast cancer, talk to your health care provider about having an MRI and a mammogram every year.  Breast cancer gene (BRCA) assessment is recommended for women who have family members with BRCA-related cancers. BRCA-related cancers include:  Breast.  Ovarian.  Tubal.  Peritoneal cancers.  Results of the assessment will determine the need for genetic counseling and BRCA1 and BRCA2  testing. Cervical Cancer Your health care provider may recommend that you be screened regularly for cancer of the pelvic organs (ovaries, uterus, and vagina). This screening involves a pelvic examination, including checking for microscopic changes to the surface of your cervix (Pap test). You may be encouraged to have this screening done every 3 years, beginning at age 31.  For women ages 67-65, health care providers may recommend pelvic exams and Pap testing every 3 years, or they may recommend the Pap and pelvic exam, combined with testing for human papilloma virus (HPV), every 5 years. Some types of HPV increase your risk of cervical cancer. Testing for HPV may also be done on women of any age with unclear Pap test results.  Other health care providers may not recommend any screening for nonpregnant women who are considered low risk for pelvic cancer and who do not have symptoms. Ask your health care provider if a screening pelvic exam is right for you.  If you have had past treatment for cervical cancer or a condition that could lead to cancer, you need Pap tests and screening for cancer for at least 20 years after your treatment. If Pap tests have been discontinued, your risk factors (such as having a new sexual partner) need to be reassessed to determine if screening should resume. Some women have medical problems that increase the chance of getting cervical cancer. In these cases, your health care provider may recommend more frequent screening and Pap tests. Colorectal Cancer  This type of cancer can be detected and often prevented.  Routine colorectal cancer screening usually begins at 57 years of age and continues through 57 years of age.  Your health care provider may recommend screening at an earlier age if you have risk factors for colon cancer.  Your health care provider may also recommend using home test kits to check for hidden blood in the stool.  A small camera at the end of a  tube can be used to examine your colon directly (sigmoidoscopy or colonoscopy). This is done to check for the earliest forms of colorectal cancer.  Routine screening usually begins at age 25.  Direct examination of the colon should be repeated every 5-10 years through 57 years of age. However, you may need to be screened more often if early forms of precancerous polyps or small growths are found. Skin Cancer  Check your skin from head to toe regularly.  Tell your health care provider about any new moles or changes in moles, especially if there is a change in a mole's shape or color.  Also tell your health care provider if you have a mole that is larger than the size of a pencil eraser.  Always use sunscreen. Apply sunscreen liberally and repeatedly throughout the day.  Protect yourself by wearing long sleeves, pants, a wide-brimmed hat, and sunglasses whenever you are outside. HEART DISEASE, DIABETES, AND HIGH BLOOD PRESSURE   High  blood pressure causes heart disease and increases the risk of stroke. High blood pressure is more likely to develop in:  People who have blood pressure in the high end of the normal range (130-139/85-89 mm Hg).  People who are overweight or obese.  People who are African American.  If you are 16-73 years of age, have your blood pressure checked every 3-5 years. If you are 19 years of age or older, have your blood pressure checked every year. You should have your blood pressure measured twice--once when you are at a hospital or clinic, and once when you are not at a hospital or clinic. Record the average of the two measurements. To check your blood pressure when you are not at a hospital or clinic, you can use:  An automated blood pressure machine at a pharmacy.  A home blood pressure monitor.  If you are between 31 years and 2 years old, ask your health care provider if you should take aspirin to prevent strokes.  Have regular diabetes screenings. This  involves taking a blood sample to check your fasting blood sugar level.  If you are at a normal weight and have a low risk for diabetes, have this test once every three years after 57 years of age.  If you are overweight and have a high risk for diabetes, consider being tested at a younger age or more often. PREVENTING INFECTION  Hepatitis B  If you have a higher risk for hepatitis B, you should be screened for this virus. You are considered at high risk for hepatitis B if:  You were born in a country where hepatitis B is common. Ask your health care provider which countries are considered high risk.  Your parents were born in a high-risk country, and you have not been immunized against hepatitis B (hepatitis B vaccine).  You have HIV or AIDS.  You use needles to inject street drugs.  You live with someone who has hepatitis B.  You have had sex with someone who has hepatitis B.  You get hemodialysis treatment.  You take certain medicines for conditions, including cancer, organ transplantation, and autoimmune conditions. Hepatitis C  Blood testing is recommended for:  Everyone born from [birth year] through 1967.  Anyone with known risk factors for hepatitis C. Sexually transmitted infections (STIs)  You should be screened for sexually transmitted infections (STIs) including gonorrhea and chlamydia if:  You are sexually active and are younger than 57 years of age.  You are older than 57 years of age and your health care provider tells you that you are at risk for this type of infection.  Your sexual activity has changed since you were last screened and you are at an increased risk for chlamydia or gonorrhea. Ask your health care provider if you are at risk.  If you do not have HIV, but are at risk, it may be recommended that you take a prescription medicine daily to prevent HIV infection. This is called pre-exposure prophylaxis (PrEP). You are considered at risk if:  You are  sexually active and do not regularly use condoms or know the HIV status of your partner(s).  You take drugs by injection.  You are sexually active with a partner who has HIV. Talk with your health care provider about whether you are at high risk of being infected with HIV. If you choose to begin PrEP, you should first be tested for HIV. You should then be tested every 3 months for as long  as you are taking PrEP.  PREGNANCY   If you are premenopausal and you may become pregnant, ask your health care provider about preconception counseling.  If you may become pregnant, take 400 to 800 micrograms (mcg) of folic acid every day.  If you want to prevent pregnancy, talk to your health care provider about birth control (contraception). OSTEOPOROSIS AND MENOPAUSE   Osteoporosis is a disease in which the bones lose minerals and strength with aging. This can result in serious bone fractures. Your risk for osteoporosis can be identified using a bone density scan.  If you are 65 years of age or older, or if you are at risk for osteoporosis and fractures, ask your health care provider if you should be screened.  Ask your health care provider whether you should take a calcium or vitamin D supplement to lower your risk for osteoporosis.  Menopause may have certain physical symptoms and risks.  Hormone replacement therapy may reduce some of these symptoms and risks. Talk to your health care provider about whether hormone replacement therapy is right for you.  HOME CARE INSTRUCTIONS   Schedule regular health, dental, and eye exams.  Stay current with your immunizations.   Do not use any tobacco products including cigarettes, chewing tobacco, or electronic cigarettes.  If you are pregnant, do not drink alcohol.  If you are breastfeeding, limit how much and how often you drink alcohol.  Limit alcohol intake to no more than 1 drink per day for nonpregnant women. One drink equals 12 ounces of beer, 5  ounces of wine, or 1 ounces of hard liquor.  Do not use street drugs.  Do not share needles.  Ask your health care provider for help if you need support or information about quitting drugs.  Tell your health care provider if you often feel depressed.  Tell your health care provider if you have ever been abused or do not feel safe at home.   This information is not intended to replace advice given to you by your health care provider. Make sure you discuss any questions you have with your health care provider.   Document Released: 12/28/2010 Document Revised: 07/05/2014 Document Reviewed: 05/16/2013 Elsevier Interactive Patient Education 2016 [employer]  

## 2016-04-02 ENCOUNTER — Encounter: Payer: Managed Care, Other (non HMO) | Admitting: Internal Medicine

## 2016-07-03 ENCOUNTER — Ambulatory Visit (INDEPENDENT_AMBULATORY_CARE_PROVIDER_SITE_OTHER): Payer: BLUE CROSS/BLUE SHIELD | Admitting: Physician Assistant

## 2016-07-03 DIAGNOSIS — Z23 Encounter for immunization: Secondary | ICD-10-CM | POA: Diagnosis not present

## 2016-09-08 NOTE — Progress Notes (Signed)
Flu vaccine only

## 2016-11-10 ENCOUNTER — Encounter: Payer: Self-pay | Admitting: Gynecology

## 2016-12-18 ENCOUNTER — Encounter: Payer: Self-pay | Admitting: Family Medicine

## 2016-12-18 ENCOUNTER — Ambulatory Visit (INDEPENDENT_AMBULATORY_CARE_PROVIDER_SITE_OTHER): Payer: BLUE CROSS/BLUE SHIELD | Admitting: Family Medicine

## 2016-12-18 DIAGNOSIS — J309 Allergic rhinitis, unspecified: Secondary | ICD-10-CM | POA: Diagnosis not present

## 2016-12-18 DIAGNOSIS — R222 Localized swelling, mass and lump, trunk: Secondary | ICD-10-CM | POA: Diagnosis not present

## 2016-12-18 NOTE — Patient Instructions (Signed)
Nice to meet you. We are going to get you set up for an imaging study to evaluate the area of swelling. Someone will contact you early next week to get this set up. If the area enlarges or becomes painful please let us know. If you developed trouble swallowing please be evaluated immediately.

## 2016-12-18 NOTE — Assessment & Plan Note (Signed)
Upper respiratory symptoms most consistent with allergic rhinitis. They are improving. Advised to continue to monitor.

## 2016-12-18 NOTE — Assessment & Plan Note (Signed)
Relatively benign exam though there is slight fullness and fatty tissue noted in the supraclavicular area on the left. Discussed typically issues with the supraclavicular region are concerning for possible cancerous processes. Discussed imaging of this area sometime next week to evaluate further. Discussed ultrasound versus CT scan. Patient wanted the most definitive study and thus we decided on CT scan. I'll place an order. We'll send a message to the patient's PCP. Discussed with the patient that if she were to develop swallowing or breathing issues she should be evaluated immediately. She denied having these issues today.

## 2016-12-18 NOTE — Progress Notes (Signed)
Dominique Rumps, MD Phone: [phone]  Dominique Lane is a 56 y.o. female who presents today for same-day visit.  Patient reports several days of allergy symptoms with sneezing and postnasal drip and blowing mucus out of her nose. She took Benadryl for this. These symptoms have improved though yesterday and this morning she noted an area of swelling in her left proximal supraclavicular area. It was soft tissue swelling. It does not hurt. It has lessened in size since this morning to some degree. She notes no night sweats or weight loss. No history of cancer though this is what concerns her the most.  ROS see history of present illness  Objective  Physical Exam Vitals:   12/18/16 1315  BP: 130/80  Pulse: 73  Temp: 98.5 F (36.9 C)    BP Readings from Last 3 Encounters:  12/18/16 130/80  03/02/16 100/68  11/10/15 114/78   Wt Readings from Last 3 Encounters:  12/18/16 210 lb 8 oz (95.5 kg)  03/02/16 191 lb 6.4 oz (86.8 kg)  11/10/15 201 lb (91.2 kg)    Physical Exam  Constitutional: She is well-developed, well-nourished, and in no distress.  HENT:  Head: Normocephalic and atraumatic.  Mouth/Throat: Oropharynx is clear and moist. No oropharyngeal exudate.  Normal TMs  Eyes: Conjunctivae are normal. Pupils are equal, round, and reactive to light.  Neck: Neck supple.    Cardiovascular: Normal rate, regular rhythm and normal heart sounds.   Pulmonary/Chest: Effort normal and breath sounds normal.  Lymphadenopathy:       Head (right side): No submental, no submandibular, no posterior auricular and no occipital adenopathy present.       Head (left side): No submental, no submandibular, no posterior auricular and no occipital adenopathy present.    She has no cervical adenopathy.       Right: No supraclavicular adenopathy present.       Left: No supraclavicular adenopathy present.     Assessment/Plan: Please see individual problem list.  Allergic rhinitis Upper  respiratory symptoms most consistent with allergic rhinitis. They are improving. Advised to continue to monitor.  Supraclavicular fossa fullness Relatively benign exam though there is slight fullness and fatty tissue noted in the supraclavicular area on the left. Discussed typically issues with the supraclavicular region are concerning for possible cancerous processes. Discussed imaging of this area sometime next week to evaluate further. Discussed ultrasound versus CT scan. Patient wanted the most definitive study and thus we decided on CT scan. I'll place an order. We'll send a message to the patient's PCP. Discussed with the patient that if she were to develop swallowing or breathing issues she should be evaluated immediately. She denied having these issues today.   Orders Placed This Encounter  Procedures  . CT Soft Tissue Neck W Contrast    Standing Status:   Future    Standing Expiration Date:   03/20/2018    Order Specific Question:   If indicated for the ordered procedure, I authorize the administration of contrast media per Radiology protocol    Answer:   Yes    Order Specific Question:   Reason for Exam (SYMPTOM  OR DIAGNOSIS REQUIRED)    Answer:   left supraclavicular soft tissue fullness and swelling    Order Specific Question:   Is patient pregnant?    Answer:   No    Order Specific Question:   Preferred imaging location?    Answer:   [facility]    Order Specific Question:  Radiology Contrast Protocol - do NOT remove file path    Answer:   [email or web address]   Dominique Rumps, MD [place]

## 2016-12-31 ENCOUNTER — Ambulatory Visit (HOSPITAL_COMMUNITY): Payer: BLUE CROSS/BLUE SHIELD

## 2017-01-04 ENCOUNTER — Ambulatory Visit (HOSPITAL_COMMUNITY)
Admission: RE | Admit: 2017-01-04 | Discharge: 2017-01-04 | Disposition: A | Discharge status: Home / Self Care | Payer: BLUE CROSS/BLUE SHIELD | Source: Ambulatory Visit | Attending: Family Medicine | Admitting: Family Medicine

## 2017-01-04 ENCOUNTER — Encounter (HOSPITAL_COMMUNITY): Payer: Self-pay

## 2017-01-04 DIAGNOSIS — M47812 Spondylosis without myelopathy or radiculopathy, cervical region: Secondary | ICD-10-CM | POA: Diagnosis not present

## 2017-01-04 DIAGNOSIS — R222 Localized swelling, mass and lump, trunk: Secondary | ICD-10-CM | POA: Diagnosis not present

## 2017-01-04 MED ORDER — IOPAMIDOL (ISOVUE-300) INJECTION 61%
INTRAVENOUS | Status: AC
  Administered 2017-01-04: 75 mL
  Filled 2017-01-04: qty 100

## 2017-01-06 ENCOUNTER — Encounter: Payer: Self-pay | Admitting: Internal Medicine

## 2017-02-08 DIAGNOSIS — R221 Localized swelling, mass and lump, neck: Secondary | ICD-10-CM | POA: Insufficient documentation

## 2017-06-02 ENCOUNTER — Encounter: Payer: Self-pay | Admitting: Physician Assistant

## 2017-06-02 ENCOUNTER — Ambulatory Visit (INDEPENDENT_AMBULATORY_CARE_PROVIDER_SITE_OTHER): Payer: BLUE CROSS/BLUE SHIELD

## 2017-06-02 ENCOUNTER — Other Ambulatory Visit: Payer: Self-pay

## 2017-06-02 ENCOUNTER — Ambulatory Visit (INDEPENDENT_AMBULATORY_CARE_PROVIDER_SITE_OTHER): Payer: BLUE CROSS/BLUE SHIELD | Admitting: Physician Assistant

## 2017-06-02 VITALS — BP 116/72 | HR 84 | Temp 97.8°F | Resp 18 | Ht 70.08 in | Wt 211.8 lb

## 2017-06-02 DIAGNOSIS — R6889 Other general symptoms and signs: Secondary | ICD-10-CM

## 2017-06-02 DIAGNOSIS — J069 Acute upper respiratory infection, unspecified: Secondary | ICD-10-CM

## 2017-06-02 DIAGNOSIS — R059 Cough, unspecified: Secondary | ICD-10-CM

## 2017-06-02 DIAGNOSIS — J029 Acute pharyngitis, unspecified: Secondary | ICD-10-CM

## 2017-06-02 DIAGNOSIS — R05 Cough: Secondary | ICD-10-CM | POA: Diagnosis not present

## 2017-06-02 LAB — POC INFLUENZA A&B (BINAX/QUICKVUE)
INFLUENZA A, POC: NEGATIVE
Influenza B, POC: NEGATIVE

## 2017-06-02 MED ORDER — BENZONATATE 100 MG PO CAPS: 100.0000 mg | ORAL_CAPSULE | Freq: Three times a day (TID) | ORAL | 0 refills | Status: DC | PRN

## 2017-06-02 MED ORDER — AZITHROMYCIN 250 MG PO TABS: ORAL_TABLET | ORAL | 0 refills | Status: DC

## 2017-06-02 MED ORDER — BENZOCAINE-MENTHOL 15-2.6 MG MT LOZG: 1.0000 | LOZENGE | OROMUCOSAL | 0 refills | Status: DC

## 2017-06-02 NOTE — Patient Instructions (Addendum)
-   We will treat this as a respiratory viral infection.  - I recommend you rest, drink plenty of fluids, eat light meals including soups.  - You may use NyQuil cough syrup at night for cough and Tessalon Perles and a day for your cough. - You may also use Cepacol throat lozenges or Tylenol or ibuprofen over-the-counter for your sore throat.  For sore throat, I recommend doing warm tea with honey, ginger, and lemon.  Since you are leaving for a trip, I have given you a printed out antibiotic if you are not feeling better in 3-5 days you can fill.   Cough, Adult A cough helps to clear your throat and lungs. A cough may last only 2-3 weeks (acute), or it may last longer than 8 weeks (chronic). Many different things can cause a cough. A cough may be a sign of an illness or another medical condition. Follow these instructions at home:  Pay attention to any changes in your cough.  Take medicines only as told by your doctor. ? If you were prescribed an antibiotic medicine, take it as told by your doctor. Do not stop taking it even if you start to feel better. ? Talk with your doctor before you try using a cough medicine.  Drink enough fluid to keep your pee (urine) clear or pale yellow.  If the air is dry, use a cold steam vaporizer or humidifier in your home.  Stay away from things that make you cough at work or at home.  If your cough is worse at night, try using extra pillows to raise your head up higher while you sleep.  Do not smoke, and try not to be around smoke. If you need help quitting, ask your doctor.  Do not have caffeine.  Do not drink alcohol.  Rest as needed. Contact a doctor if:  You have new problems (symptoms).  You cough up yellow fluid (pus).  Your cough does not get better after 2-3 weeks, or your cough gets worse.  Medicine does not help your cough and you are not sleeping well.  You have pain that gets worse or pain that is not helped with medicine.  You  have a fever.  You are losing weight and you do not know why.  You have night sweats. Get help right away if:  You cough up blood.  You have trouble breathing.  Your heartbeat is very fast. This information is not intended to replace advice given to you by your health care provider. Make sure you discuss any questions you have with your health care provider. Document Released: 02/25/2011 Document Revised: 11/20/2015 Document Reviewed: 08/21/2014 Elsevier Interactive Patient Education  2018 [employer].   IF you received an x-ray today, you will receive an invoice from [facility]. Please contact [facility] at [phone] with questions or concerns regarding your invoice.   IF you received labwork today, you will receive an invoice from [facility]. Please contact [facility] at [phone] with questions or concerns regarding your invoice.   Our billing staff will not be able to assist you with questions regarding bills from these companies.  You will be contacted with the lab results as soon as they are available. The fastest way to get your results is to activate your My Chart account. Instructions are located on the last page of this paperwork. If you have not heard from [place] regarding the results in 2 weeks, please contact this office.

## 2017-06-02 NOTE — Progress Notes (Signed)
MRN: [id number] DOB: 1960-09-30  Subjective:   Dominique Lane is a 56 y.o. female presenting for chief complaint of Cough (X 5 days- with phylem); Hoarse (X 1 day- sore throat); and Ear Fullness (X today) .  Reports 5 day history of subjective fever, ear fullness, sore throat, productive cough (no hemoptysis) and myalgia. She is now coughing up big globules of green thick sputum. Has tried zyrtec and dayquil with moderate relief.  Has tried mucinex with no relief. Notes she is actually feeling better today but she is leaving for a trip and wants to make sure she is okay to go because she will not have access to a doctor when she is there.  Denies sinus pain, wheezing, shortness of breath, chest tightness and chest pain, nausea, vomiting, abdominal pain and diarrhea. Has had sick contact with boyfriend. No history of seasonal allergies, no history of asthma. Patient has had flu shot this season. Denies smoking. History of pneumonia.  Denies any other aggravating or relieving factors, no other questions or concerns.  Dominique Lane has a current medication list which includes the following prescription(s): ibuprofen-famotidine, azithromycin, benzocaine-menthol, and benzonatate. Also is allergic to cephalosporins; naproxen sodium; and hydromet [hydrocodone-homatropine].  Dominique Lane  has a past medical history of Adenomyosis, Fibroid, Headache(784.0), Heart murmur, Hyperlipidemia, and MVP (mitral valve prolapse). Also  has a past surgical history that includes Cholecystectomy; Ovarian cyst removal; Tubal ligation; Ganglion cyst excision; Rotator cuff repair; Vaginal hysterectomy; Colonoscopy (2011); fractured mandible (1997); laparoscopy (N/A, 09/13/2012); Salpingoophorectomy (Bilateral, 09/13/2012); Laparoscopic lysis of adhesions (N/A, 09/13/2012); and Carpal tunnel release (Bilateral).   Objective:   Vitals: BP 116/72 (BP Location: Left Arm, Patient Position: Sitting, Cuff Size: Large)   Pulse 84   Temp  97.8 F (36.6 C) (Oral)   Resp 18   Ht 5' 10.08" (1.78 m)   Wt 211 lb 12.8 oz (96.1 kg)   SpO2 98%   BMI 30.32 kg/m   Physical Exam  Constitutional: She is oriented to person, place, and time. She appears well-developed and well-nourished.  HENT:  Head: Normocephalic and atraumatic.  Right Ear: External ear and ear canal normal. Tympanic membrane is not erythematous and not bulging. A middle ear effusion is present.  Left Ear: External ear and ear canal normal. Tympanic membrane is not erythematous and not bulging. A middle ear effusion is present.  Nose: Rhinorrhea present. Right sinus exhibits no maxillary sinus tenderness and no frontal sinus tenderness. Left sinus exhibits no maxillary sinus tenderness and no frontal sinus tenderness.  Mouth/Throat: Uvula is midline and mucous membranes are normal. Posterior oropharyngeal erythema present. Tonsils are 1+ on the right. Tonsils are 1+ on the left. No tonsillar exudate.  Hoarse voice noted   Eyes: Conjunctivae are normal.  Neck: Normal range of motion.  Cardiovascular: Normal rate, regular rhythm and normal heart sounds.  Pulmonary/Chest: Effort normal and breath sounds normal. She has no wheezes. She has no rhonchi. She has no rales.  Lymphadenopathy:       Head (right side): No submental, no submandibular, no tonsillar, no preauricular, no posterior auricular and no occipital adenopathy present.       Head (left side): No submental, no submandibular, no tonsillar, no preauricular, no posterior auricular and no occipital adenopathy present.    She has no cervical adenopathy.       Right: No supraclavicular adenopathy present.       Left: No supraclavicular adenopathy present.  Neurological: She is alert and oriented to person, place,  and time.  Skin: Skin is warm and dry.  Psychiatric: She has a normal mood and affect.  Vitals reviewed.   Results for orders placed or performed in visit on 06/02/17 (from the past 24 hour(s))  POC  Influenza A&B(BINAX/QUICKVUE)     Status: None   Collection Time: 06/02/17 12:11 PM  Result Value Ref Range   Influenza A, POC Negative Negative   Influenza B, POC Negative Negative   Dg Chest 2 View  Result Date: 06/02/2017 CLINICAL DATA:  Cough. EXAM: CHEST  2 VIEW COMPARISON:  01/09/2015 . FINDINGS: Mediastinum and hilar structures normal. Lungs are clear. Heart size normal. No pleural effusion or pneumothorax. No acute bony abnormality . IMPRESSION: No acute cardiopulmonary disease. Electronically Signed   By: Marcello Moores  Register   On: 06/02/2017 12:20    Assessment and Plan :  1. Flu-like symptoms - POC Influenza A&B(BINAX/QUICKVUE) 2. Cough - DG Chest 2 View; Future - benzonatate (TESSALON) 100 MG capsule; Take 1-2 capsules (100-200 mg total) by mouth 3 (three) times daily as needed for cough.  Dispense: 40 capsule; Refill: 0 - azithromycin (ZITHROMAX) 250 MG tablet; Take 2 tabs PO x 1 dose, then 1 tab PO QD x 4 days  Dispense: 6 tablet; Refill: 0 3. Sore throat - Benzocaine-Menthol (CEPACOL EXTRA STRENGTH) 15-2.6 MG LOZG; Use as directed 1 lozenge in the mouth or throat every 4 (four) hours.  Dispense: 20 lozenge; Refill: 0 4. Acute upper respiratory infection Patient has history consistent with upper respiratory infection.  She is having a productive cough, which is starting to improve.  Vitals stable.  She is afebrile.  Rapid influenza test negative.  Chest x-ray with no acute findings.  It is reassuring that patient is starting to feel better.  Patient is leaving for a cruise in a few days.  She is mostly concerned about her cough due to her history of pneumonia.  Recommended she continue with symptomatic treatment at this time.  I have given her a printout for an antibiotic to fill if she is not having any improvement in 3-5 days or her symptoms are to worsen.  Tenna Delaine, PA-C  [facility] 06/02/2017 8:02 PM

## 2017-06-03 ENCOUNTER — Ambulatory Visit: Payer: BLUE CROSS/BLUE SHIELD | Admitting: Internal Medicine

## 2017-07-13 ENCOUNTER — Encounter: Payer: BLUE CROSS/BLUE SHIELD | Admitting: Internal Medicine

## 2017-07-21 ENCOUNTER — Encounter: Payer: Self-pay | Admitting: Internal Medicine

## 2017-07-21 ENCOUNTER — Ambulatory Visit: Payer: BLUE CROSS/BLUE SHIELD | Admitting: Internal Medicine

## 2017-07-21 DIAGNOSIS — R490 Dysphonia: Secondary | ICD-10-CM | POA: Diagnosis not present

## 2017-07-21 DIAGNOSIS — R222 Localized swelling, mass and lump, trunk: Secondary | ICD-10-CM

## 2017-07-21 MED ORDER — OMEPRAZOLE 40 MG PO CPDR: 40.0000 mg | DELAYED_RELEASE_CAPSULE | Freq: Every day | ORAL | 3 refills | Status: DC

## 2017-07-21 NOTE — Patient Instructions (Signed)
We have sent in the medicine for the stomach. Take 1 pill daily for the next month to help the hoarseness.

## 2017-07-22 DIAGNOSIS — R490 Dysphonia: Secondary | ICD-10-CM | POA: Insufficient documentation

## 2017-07-22 NOTE — Assessment & Plan Note (Signed)
Given reassurance that if this is coming and going likely a lymph node which is not worrisome. CT done without findings. If this was malignant if would not come and go and would have shown up on imaging.

## 2017-07-22 NOTE — Assessment & Plan Note (Signed)
Rx for prilosec daily to take. Advised that it can take up to 1-2 months of treatment to see results with hoarseness if GERD is the cause. She will take and call back if no improvement.

## 2017-07-22 NOTE — Progress Notes (Signed)
   Subjective:    Patient ID: Dominique Lane, female    DOB: 05-13-1961, 57 y.o.   MRN: [id number]  HPI The patient is a 57 YO female coming in for hoarseness. She is taking some gerd medicine for about 3 days which did not help the problem. So then she tried some allergy medicine for 3 days which also did not help. Denies cold symptoms. Denies cough or SOB. She does have GERD and had a severe episode about several days ago and took applecider vinegar which helped. She had seen seen about 6 months ago for lump on her neck. CT scan was done and did not show any problems or lumps. She then saw ENT and they were going to biopsy it but could not find it when she came in for the procedure. She is concerned about it. Denies growing but sometimes comes and goes.   Review of Systems  Constitutional: Negative.   HENT: Positive for voice change. Negative for congestion, dental problem, ear discharge, hearing loss, mouth sores, postnasal drip, rhinorrhea, sinus pressure and sore throat.   Eyes: Negative.   Respiratory: Negative for cough, chest tightness and shortness of breath.   Cardiovascular: Negative for chest pain, palpitations and leg swelling.  Gastrointestinal: Negative for abdominal distention, abdominal pain, constipation, diarrhea, nausea and vomiting.  Musculoskeletal: Negative.   Skin: Negative.   Neurological: Negative.   Hematological: Positive for adenopathy.  Psychiatric/Behavioral: Negative.       Objective:   Physical Exam  Constitutional: She is oriented to person, place, and time. She appears well-developed and well-nourished.  HENT:  Head: Normocephalic and atraumatic.  Slight redness of the oropharynx, no drainage appreciated.   Eyes: EOM are normal.  Neck: Normal range of motion.  She is adamant that there is a lump on the left collar bone region however not able to find anything worrisome on palpation  Cardiovascular: Normal rate and regular rhythm.  Pulmonary/Chest:  Effort normal and breath sounds normal. No respiratory distress. She has no wheezes. She has no rales.  Abdominal: Soft. Bowel sounds are normal. She exhibits no distension. There is no tenderness. There is no rebound.  Musculoskeletal: She exhibits no edema.  Neurological: She is alert and oriented to person, place, and time. Coordination normal.  Skin: Skin is warm and dry.   Vitals:   07/21/17 1526  BP: 138/88  Pulse: 92  Temp: 97.7 F (36.5 C)  TempSrc: Oral  SpO2: 98%  Weight: 213 lb (96.6 kg)  Height: 5' 10.8" (1.798 m)      Assessment & Plan:

## 2017-08-01 ENCOUNTER — Encounter: Payer: BLUE CROSS/BLUE SHIELD | Admitting: Internal Medicine

## 2017-08-08 ENCOUNTER — Encounter: Payer: Self-pay | Admitting: Internal Medicine

## 2017-08-08 ENCOUNTER — Ambulatory Visit (INDEPENDENT_AMBULATORY_CARE_PROVIDER_SITE_OTHER): Payer: BLUE CROSS/BLUE SHIELD | Admitting: Internal Medicine

## 2017-08-08 ENCOUNTER — Other Ambulatory Visit (INDEPENDENT_AMBULATORY_CARE_PROVIDER_SITE_OTHER): Payer: BLUE CROSS/BLUE SHIELD

## 2017-08-08 VITALS — BP 130/88 | HR 69 | Temp 98.0°F | Ht 70.8 in | Wt 212.2 lb

## 2017-08-08 DIAGNOSIS — E782 Mixed hyperlipidemia: Secondary | ICD-10-CM

## 2017-08-08 DIAGNOSIS — Z1159 Encounter for screening for other viral diseases: Secondary | ICD-10-CM | POA: Diagnosis not present

## 2017-08-08 DIAGNOSIS — K219 Gastro-esophageal reflux disease without esophagitis: Secondary | ICD-10-CM

## 2017-08-08 DIAGNOSIS — Z0000 Encounter for general adult medical examination without abnormal findings: Secondary | ICD-10-CM

## 2017-08-08 LAB — COMPREHENSIVE METABOLIC PANEL
ALT: 16 U/L (ref 0–35)
AST: 16 U/L (ref 0–37)
Albumin: 4.1 g/dL (ref 3.5–5.2)
Alkaline Phosphatase: 85 U/L (ref 39–117)
BUN: 13 mg/dL (ref 6–23)
CALCIUM: 9.1 mg/dL (ref 8.4–10.5)
CHLORIDE: 106 meq/L (ref 96–112)
CO2: 26 mEq/L (ref 19–32)
Creatinine, Ser: 0.77 mg/dL (ref 0.40–1.20)
GFR: 81.63 mL/min (ref >60.00)
GLUCOSE: 91 mg/dL (ref 70–99)
POTASSIUM: 4.2 meq/L (ref 3.5–5.1)
Sodium: 140 mEq/L (ref 135–145)
Total Bilirubin: 0.5 mg/dL (ref 0.2–1.2)
Total Protein: 7 g/dL (ref 6.0–8.3)

## 2017-08-08 LAB — LIPID PANEL
CHOL/HDL RATIO: 6
Cholesterol: 252 mg/dL — ABNORMAL HIGH (ref 0–200)
HDL: 40.4 mg/dL (ref >39.00)
NonHDL: 211.57
TRIGLYCERIDES: 208 mg/dL — AB (ref 0.0–149.0)
VLDL: 41.6 mg/dL — AB (ref 0.0–40.0)

## 2017-08-08 LAB — LDL CHOLESTEROL, DIRECT: Direct LDL: 170 mg/dL

## 2017-08-08 NOTE — Assessment & Plan Note (Signed)
Checking hep c screening test today. Flu and tetanus up to date. Counseled about shingrix and she declines today. Colonoscopy up to date. Needs mammogram and pap and plans to return to gyn. She is counseled about sun safety and mole surveillance. Given screening recommendations.

## 2017-08-08 NOTE — Patient Instructions (Signed)
We will check the labs today.   Make sure to get the mammogram done.  Health Maintenance, Female Adopting a healthy lifestyle and getting preventive care can go a long way to promote health and wellness. Talk with your health care provider about what schedule of regular examinations is right for you. This is a good chance for you to check in with your provider about disease prevention and staying healthy. In between checkups, there are plenty of things you can do on your own. Experts have done a lot of research about which lifestyle changes and preventive measures are most likely to keep you healthy. Ask your health care provider for more information. Weight and diet Eat a healthy diet  Be sure to include plenty of vegetables, fruits, low-fat dairy products, and lean protein.  Do not eat a lot of foods high in solid fats, added sugars, or salt.  Get regular exercise. This is one of the most important things you can do for your health. ? Most adults should exercise for at least 150 minutes each week. The exercise should increase your heart rate and make you sweat (moderate-intensity exercise). ? Most adults should also do strengthening exercises at least twice a week. This is in addition to the moderate-intensity exercise.  Maintain a healthy weight  Body mass index (BMI) is a measurement that can be used to identify possible weight problems. It estimates body fat based on height and weight. Your health care provider can help determine your BMI and help you achieve or maintain a healthy weight.  For females 14 years of age and older: ? A BMI below 18.5 is considered underweight. ? A BMI of 18.5 to 24.9 is normal. ? A BMI of 25 to 29.9 is considered overweight. ? A BMI of 30 and above is considered obese.  Watch levels of cholesterol and blood lipids  You should start having your blood tested for lipids and cholesterol at 59 years of age, then have this test every 5 years.  You may need  to have your cholesterol levels checked more often if: ? Your lipid or cholesterol levels are high. ? You are older than 59 years of age. ? You are at high risk for heart disease.  Cancer screening Lung Cancer  Lung cancer screening is recommended for adults 76-13 years old who are at high risk for lung cancer because of a history of smoking.  A yearly low-dose CT scan of the lungs is recommended for people who: ? Currently smoke. ? Have quit within the past 15 years. ? Have at least a 30-pack-year history of smoking. A pack year is smoking an average of one pack of cigarettes a day for 1 year.  Yearly screening should continue until it has been 15 years since you quit.  Yearly screening should stop if you develop a health problem that would prevent you from having lung cancer treatment.  Breast Cancer  Practice breast self-awareness. This means understanding how your breasts normally appear and feel.  It also means doing regular breast self-exams. Let your health care provider know about any changes, no matter how small.  If you are in your 20s or 30s, you should have a clinical breast exam (CBE) by a health care provider every 1-3 years as part of a regular health exam.  If you are 10 or older, have a CBE every year. Also consider having a breast X-ray (mammogram) every year.  If you have a family history of breast cancer,  talk to your health care provider about genetic screening.  If you are at high risk for breast cancer, talk to your health care provider about having an MRI and a mammogram every year.  Breast cancer gene (BRCA) assessment is recommended for women who have family members with BRCA-related cancers. BRCA-related cancers include: ? Breast. ? Ovarian. ? Tubal. ? Peritoneal cancers.  Results of the assessment will determine the need for genetic counseling and BRCA1 and BRCA2 testing.  Cervical Cancer Your health care provider may recommend that you be  screened regularly for cancer of the pelvic organs (ovaries, uterus, and vagina). This screening involves a pelvic examination, including checking for microscopic changes to the surface of your cervix (Pap test). You may be encouraged to have this screening done every 3 years, beginning at age 21.  For women ages 29-65, health care providers may recommend pelvic exams and Pap testing every 3 years, or they may recommend the Pap and pelvic exam, combined with testing for human papilloma virus (HPV), every 5 years. Some types of HPV increase your risk of cervical cancer. Testing for HPV may also be done on women of any age with unclear Pap test results.  Other health care providers may not recommend any screening for nonpregnant women who are considered low risk for pelvic cancer and who do not have symptoms. Ask your health care provider if a screening pelvic exam is right for you.  If you have had past treatment for cervical cancer or a condition that could lead to cancer, you need Pap tests and screening for cancer for at least 20 years after your treatment. If Pap tests have been discontinued, your risk factors (such as having a new sexual partner) need to be reassessed to determine if screening should resume. Some women have medical problems that increase the chance of getting cervical cancer. In these cases, your health care provider may recommend more frequent screening and Pap tests.  Colorectal Cancer  This type of cancer can be detected and often prevented.  Routine colorectal cancer screening usually begins at 59 years of age and continues through 59 years of age.  Your health care provider may recommend screening at an earlier age if you have risk factors for colon cancer.  Your health care provider may also recommend using home test kits to check for hidden blood in the stool.  A small camera at the end of a tube can be used to examine your colon directly (sigmoidoscopy or colonoscopy).  This is done to check for the earliest forms of colorectal cancer.  Routine screening usually begins at age 92.  Direct examination of the colon should be repeated every 5-10 years through 59 years of age. However, you may need to be screened more often if early forms of precancerous polyps or small growths are found.  Skin Cancer  Check your skin from head to toe regularly.  Tell your health care provider about any new moles or changes in moles, especially if there is a change in a mole's shape or color.  Also tell your health care provider if you have a mole that is larger than the size of a pencil eraser.  Always use sunscreen. Apply sunscreen liberally and repeatedly throughout the day.  Protect yourself by wearing long sleeves, pants, a wide-brimmed hat, and sunglasses whenever you are outside.  Heart disease, diabetes, and high blood pressure  High blood pressure causes heart disease and increases the risk of stroke. High blood pressure is  more likely to develop in: ? People who have blood pressure in the high end of the normal range (130-139/85-89 mm Hg). ? People who are overweight or obese. ? People who are African American.  If you are 47-80 years of age, have your blood pressure checked every 3-5 years. If you are 94 years of age or older, have your blood pressure checked every year. You should have your blood pressure measured twice-once when you are at a hospital or clinic, and once when you are not at a hospital or clinic. Record the average of the two measurements. To check your blood pressure when you are not at a hospital or clinic, you can use: ? An automated blood pressure machine at a pharmacy. ? A home blood pressure monitor.  If you are between 34 years and 10 years old, ask your health care provider if you should take aspirin to prevent strokes.  Have regular diabetes screenings. This involves taking a blood sample to check your fasting blood sugar level. ? If  you are at a normal weight and have a low risk for diabetes, have this test once every three years after 59 years of age. ? If you are overweight and have a high risk for diabetes, consider being tested at a younger age or more often. Preventing infection Hepatitis B  If you have a higher risk for hepatitis B, you should be screened for this virus. You are considered at high risk for hepatitis B if: ? You were born in a country where hepatitis B is common. Ask your health care provider which countries are considered high risk. ? Your parents were born in a high-risk country, and you have not been immunized against hepatitis B (hepatitis B vaccine). ? You have HIV or AIDS. ? You use needles to inject street drugs. ? You live with someone who has hepatitis B. ? You have had sex with someone who has hepatitis B. ? You get hemodialysis treatment. ? You take certain medicines for conditions, including cancer, organ transplantation, and autoimmune conditions.  Hepatitis C  Blood testing is recommended for: ? Everyone born from [birth year] through 1967. ? Anyone with known risk factors for hepatitis C.  Sexually transmitted infections (STIs)  You should be screened for sexually transmitted infections (STIs) including gonorrhea and chlamydia if: ? You are sexually active and are younger than 59 years of age. ? You are older than 59 years of age and your health care provider tells you that you are at risk for this type of infection. ? Your sexual activity has changed since you were last screened and you are at an increased risk for chlamydia or gonorrhea. Ask your health care provider if you are at risk.  If you do not have HIV, but are at risk, it may be recommended that you take a prescription medicine daily to prevent HIV infection. This is called pre-exposure prophylaxis (PrEP). You are considered at risk if: ? You are sexually active and do not regularly use condoms or know the HIV status of your  partner(s). ? You take drugs by injection. ? You are sexually active with a partner who has HIV.  Talk with your health care provider about whether you are at high risk of being infected with HIV. If you choose to begin PrEP, you should first be tested for HIV. You should then be tested every 3 months for as long as you are taking PrEP. Pregnancy  If you are premenopausal and you  may become pregnant, ask your health care provider about preconception counseling.  If you may become pregnant, take 400 to 800 micrograms (mcg) of folic acid every day.  If you want to prevent pregnancy, talk to your health care provider about birth control (contraception). Osteoporosis and menopause  Osteoporosis is a disease in which the bones lose minerals and strength with aging. This can result in serious bone fractures. Your risk for osteoporosis can be identified using a bone density scan.  If you are 59 years of age or older, or if you are at risk for osteoporosis and fractures, ask your health care provider if you should be screened.  Ask your health care provider whether you should take a calcium or vitamin D supplement to lower your risk for osteoporosis.  Menopause may have certain physical symptoms and risks.  Hormone replacement therapy may reduce some of these symptoms and risks. Talk to your health care provider about whether hormone replacement therapy is right for you. Follow these instructions at home:  Schedule regular health, dental, and eye exams.  Stay current with your immunizations.  Do not use any tobacco products including cigarettes, chewing tobacco, or electronic cigarettes.  If you are pregnant, do not drink alcohol.  If you are breastfeeding, limit how much and how often you drink alcohol.  Limit alcohol intake to no more than 1 drink per day for nonpregnant women. One drink equals 12 ounces of beer, 5 ounces of wine, or 1 ounces of hard liquor.  Do not use street  drugs.  Do not share needles.  Ask your health care provider for help if you need support or information about quitting drugs.  Tell your health care provider if you often feel depressed.  Tell your health care provider if you have ever been abused or do not feel safe at home. This information is not intended to replace advice given to you by your health care provider. Make sure you discuss any questions you have with your health care provider. Document Released: 12/28/2010 Document Revised: 11/20/2015 Document Reviewed: 03/18/2015 Elsevier Interactive Patient Education  [employer].

## 2017-08-08 NOTE — Progress Notes (Signed)
   Subjective:    Patient ID: Dominique Lane, female    DOB: [birth year], 57 y.o.   MRN: [id number]  HPI The patient is a 57 YO female coming in for physical. No new concerns. Still waiting to hear about disability.   PMH, [facility], social history reviewed and updated.   Review of Systems  Constitutional: Negative.   HENT: Negative.   Eyes: Negative.   Respiratory: Negative for cough, chest tightness and shortness of breath.   Cardiovascular: Negative for chest pain, palpitations and leg swelling.  Gastrointestinal: Negative for abdominal distention, abdominal pain, constipation, diarrhea, nausea and vomiting.  Musculoskeletal: Positive for arthralgias.  Skin: Negative.   Neurological: Negative.   Psychiatric/Behavioral: Negative.       Objective:   Physical Exam  Constitutional: She is oriented to person, place, and time. She appears well-developed and well-nourished.  HENT:  Head: Normocephalic and atraumatic.  Eyes: EOM are normal.  Neck: Normal range of motion.  Cardiovascular: Normal rate and regular rhythm.  Pulmonary/Chest: Effort normal and breath sounds normal. No respiratory distress. She has no wheezes. She has no rales.  Abdominal: Soft. Bowel sounds are normal. She exhibits no distension. There is no tenderness. There is no rebound.  Musculoskeletal: She exhibits no edema.  Neurological: She is alert and oriented to person, place, and time. Coordination normal.  Skin: Skin is warm and dry.  Psychiatric: She has a normal mood and affect.   Vitals:   08/08/17 0943  BP: 130/88  Pulse: 69  Temp: 98 F (36.7 C)  TempSrc: Oral  SpO2: 97%  Weight: 212 lb 3.2 oz (96.3 kg)  Height: 5' 10.8" (1.798 m)      Assessment & Plan:

## 2017-08-08 NOTE — Assessment & Plan Note (Signed)
Taking omeprazole sometimes. Hoarseness is improved.

## 2017-08-08 NOTE — Assessment & Plan Note (Signed)
Checking lipid panel and diet controlled at this time. Adjust as needed.

## 2017-08-09 LAB — CBC
HCT: 37.8 % (ref 35.0–45.0)
HEMOGLOBIN: 12.8 g/dL (ref 11.7–15.5)
MCH: 29.3 pg (ref 27.0–33.0)
MCHC: 33.9 g/dL (ref 32.0–36.0)
MCV: 86.5 fL (ref 80.0–100.0)
MPV: 10 fL (ref 7.5–12.5)
PLATELETS: 243 10*3/uL (ref 140–400)
RBC: 4.37 10*6/uL (ref 3.80–5.10)
RDW: 12.9 % (ref 11.0–15.0)
WBC: 5.2 10*3/uL (ref 3.8–10.8)

## 2017-08-09 LAB — HEPATITIS C ANTIBODY
HEP C AB: NONREACTIVE
SIGNAL TO CUT-OFF: 0.01 (ref <1.00)

## 2017-09-22 DIAGNOSIS — K219 Gastro-esophageal reflux disease without esophagitis: Secondary | ICD-10-CM | POA: Insufficient documentation

## 2017-11-04 ENCOUNTER — Other Ambulatory Visit: Payer: Self-pay | Admitting: Gynecology

## 2017-11-04 DIAGNOSIS — Z1231 Encounter for screening mammogram for malignant neoplasm of breast: Secondary | ICD-10-CM

## 2017-11-09 ENCOUNTER — Encounter: Payer: Self-pay | Admitting: Gynecology

## 2017-11-24 ENCOUNTER — Ambulatory Visit: Payer: BLUE CROSS/BLUE SHIELD | Admitting: Gynecology

## 2017-11-24 ENCOUNTER — Encounter: Payer: Self-pay | Admitting: Gynecology

## 2017-11-24 VITALS — BP 118/78 | Ht 69.0 in | Wt 216.0 lb

## 2017-11-24 DIAGNOSIS — N952 Postmenopausal atrophic vaginitis: Secondary | ICD-10-CM | POA: Diagnosis not present

## 2017-11-24 DIAGNOSIS — M8580 Other specified disorders of bone density and structure, unspecified site: Secondary | ICD-10-CM

## 2017-11-24 DIAGNOSIS — Z01419 Encounter for gynecological examination (general) (routine) without abnormal findings: Secondary | ICD-10-CM | POA: Diagnosis not present

## 2017-11-24 NOTE — Progress Notes (Signed)
    Dominique Lane [birth year] [id number]        57 y.o.  G2P2002 for annual gynecologic exam.  Former patient of Dr. Toney Rakes and Dr. Cherylann Banas doing well without gynecologic complaints.  Past medical history,surgical history, problem list, medications, allergies, family history and social history were all reviewed and documented as reviewed in the EPIC chart.  ROS:  Performed with pertinent positives and negatives included in the history, assessment and plan.   Additional significant findings : None   Exam: Caryn Bee assistant Vitals:   11/24/17 1357  BP: 118/78  Weight: 216 lb (98 kg)  Height: 5\' 9"  (1.753 m)   Body mass index is 31.9 kg/m.  General appearance:  Normal affect, orientation and appearance. Skin: Grossly normal HEENT: Without gross lesions.  No cervical or supraclavicular adenopathy. Thyroid normal.  Lungs:  Clear without wheezing, rales or rhonchi Cardiac: RR, without RMG Abdominal:  Soft, nontender, without masses, guarding, rebound, organomegaly or hernia Breasts:  Examined lying and sitting without masses, retractions, discharge or axillary adenopathy. Pelvic:  Ext, BUS, Vagina: With atrophic changes.  Pap smear of vaginal cuff done  Adnexa: Without masses or tenderness    Anus and perineum: Normal   Rectovaginal: Normal sphincter tone without palpated masses or tenderness.    Assessment/Plan:  58 y.o. G31P2002 female for annual gynecologic exam status post [facility] Dr. Cherylann Banas and subsequent laparoscopic BSO by Dr. Toney Rakes for benign indications..   1. Postmenopausal.  Doing well without hot flushes, night sweats or vaginal dryness. 2. Pap smear 2011.  Pap smear of vaginal cuff today.  Reports abnormal Pap smear 30 years ago but normal Pap smears since.  Options to stop screening per current screening guidelines based on hysterectomy history reviewed.  Will readdress on an annual basis. 3. Mammography 09/2017.  Continue with annual mammography when  due.  Breast exam normal today. 4. Colonoscopy 2011 with reported repeat interval 10 years. 5. Osteopenia.  DEXA 2005 T score -1.6.  Recommend follow-up DEXA now and patient will schedule in follow-up for this. 6. Health maintenance.  No routine lab work done as patient does this elsewhere.  Follow-up for bone density.  Follow-up for annual exam in 1 year.   Anastasio Auerbach MD, 2:20 PM 11/24/2017

## 2017-11-24 NOTE — Patient Instructions (Signed)
Follow-up for the bone density as scheduled. 

## 2017-11-24 NOTE — Addendum Note (Signed)
Addended by: Nelva Nay on: 11/24/2017 03:23 PM   Modules accepted: Orders

## 2017-11-25 LAB — PAP IG W/ RFLX HPV ASCU

## 2017-12-09 ENCOUNTER — Encounter: Payer: Self-pay | Admitting: Internal Medicine

## 2017-12-09 ENCOUNTER — Ambulatory Visit: Payer: BLUE CROSS/BLUE SHIELD | Admitting: Internal Medicine

## 2017-12-09 DIAGNOSIS — S20362A Insect bite (nonvenomous) of left front wall of thorax, initial encounter: Secondary | ICD-10-CM | POA: Diagnosis not present

## 2017-12-09 DIAGNOSIS — S20361A Insect bite (nonvenomous) of right front wall of thorax, initial encounter: Secondary | ICD-10-CM | POA: Diagnosis not present

## 2017-12-09 DIAGNOSIS — W57XXXA Bitten or stung by nonvenomous insect and other nonvenomous arthropods, initial encounter: Secondary | ICD-10-CM | POA: Diagnosis not present

## 2017-12-09 MED ORDER — DOXYCYCLINE HYCLATE 100 MG PO TABS: 100.0000 mg | ORAL_TABLET | Freq: Two times a day (BID) | ORAL | 0 refills | Status: DC

## 2017-12-09 NOTE — Patient Instructions (Signed)
Please take all new medication as prescribed - the antibiotic  Please continue all other medications as before, and refills have been done if requested.  Please have the pharmacy call with any other refills you may need.  Please keep your appointments with your specialists as you may have planned   

## 2017-12-09 NOTE — Progress Notes (Signed)
Subjective:    Patient ID: Dominique Lane, female    DOB: Nov 03, 1960, 57 y.o.   MRN: [id number]  HPI here to report at least 6 tick bites in past 2 wks, [place] homes she visited, with 2 bites to RLE medial leg now improving with home topical remedy, but also has newer lesions/bites to left upper back, LLQ and RUQ - the latter of which appears to be the newest and assoc with marked erythema surrounding mild tender as well, but without red streaks, ulcer or fluctuance or drainage  No fever or overt rash otherwise.  Denies joint pain Past Medical History:  Diagnosis Date  . Adenomyosis   . Fibroid   . Headache(784.0)   . Heart murmur   . Hyperlipidemia   . MVP (mitral valve prolapse)    Antibiotics required   Past Surgical History:  Procedure Laterality Date  . CARPAL TUNNEL RELEASE Bilateral   . CHOLECYSTECTOMY    . COLONOSCOPY  2011   Dr Amedeo Plenty  . fractured mandible  1997   repair  . GANGLION CYST EXCISION    . LAPAROSCOPIC LYSIS OF ADHESIONS N/A 09/13/2012   Procedure: LAPAROSCOPIC LYSIS OF ADHESIONS;  Surgeon: Terrance Mass, MD;  Location: [facility];  Service: Gynecology;  Laterality: N/A;  . LAPAROSCOPY N/A 09/13/2012   Procedure: LAPAROSCOPY OPERATIVE;  Surgeon: Terrance Mass, MD;  Location: [facility];  Service: Gynecology;  Laterality: N/A;  . OVARIAN CYST REMOVAL     Right  . ROTATOR CUFF REPAIR     twice on left and once on right  . SALPINGOOPHORECTOMY Bilateral 09/13/2012   Procedure: SALPINGO OOPHORECTOMY;  Surgeon: Terrance Mass, MD;  Location: [facility];  Service: Gynecology;  Laterality: Bilateral;  . TUBAL LIGATION    . VAGINAL HYSTERECTOMY      reports that she has never smoked. She has never used smokeless tobacco. She reports that she drinks alcohol. She reports that she does not use drugs. family history includes Bladder Cancer (age of onset: 38) in her father; Heart disease in her mother; Hypertension in her sister; Leukemia in her  paternal grandfather; Lymphoma in her paternal aunt; Ovarian cancer (age of onset: 33) in her sister. Allergies  Allergen Reactions  . Cephalosporins Hives    All over the body.  . Naproxen Sodium Anaphylaxis  . Hydromet [Hydrocodone-Homatropine] Itching   Current Outpatient Medications on File Prior to Visit  Medication Sig Dispense Refill  . Ibuprofen-Famotidine (DUEXIS PO) Take 800 mg by mouth as needed.    Marland Kitchen omeprazole (PRILOSEC) 40 MG capsule Take 1 capsule (40 mg total) by mouth daily. 30 capsule 3   No current facility-administered medications on file prior to visit.    Review of Systems  Constitutional: Negative for other unusual diaphoresis or sweats HENT: Negative for ear discharge or swelling Eyes: Negative for other worsening visual disturbances Respiratory: Negative for stridor or other swelling  Gastrointestinal: Negative for worsening distension or other blood Genitourinary: Negative for retention or other urinary change Musculoskeletal: Negative for other MSK pain or swelling Skin: Negative for color change or other new lesions Neurological: Negative for worsening tremors and other numbness  Psychiatric/Behavioral: Negative for worsening agitation or other fatigue All other system neg per pt    Objective:   Physical Exam BP 118/82   Pulse 75   Temp 98.6 F (37 C) (Oral)   Ht 5\' 9"  (1.753 m)   Wt 216 lb (98 kg)  SpO2 94%   BMI 31.90 kg/m  VS noted,  Constitutional: Pt appears in NAD HENT: Head: NCAT.  Right Ear: External ear normal.  Left Ear: External ear normal.  Eyes: . Pupils are equal, round, and reactive to light. Conjunctivae and EOM are normal Nose: without d/c or deformity Neck: Neck supple. Gross normal ROM Cardiovascular: Normal rate and regular rhythm.   Pulmonary/Chest: Effort normal and breath sounds without rales or wheezing.  Abd:  Soft, NT, + BS Neurological: Pt is alert. At baseline orientation, motor grossly intact Skin: Skin is  warm, no LE edema, has multiple bite sites to right leg, LLQ and LUQ, as well as left upper back, the LUQ one with 3 cm surrounding erythema and tender Psychiatric: Pt behavior is normal without agitation  No other exam findings    Assessment & Plan:

## 2017-12-09 NOTE — Assessment & Plan Note (Signed)
Multiple recent, the last with marked erythema and ? superinfectoin - for doxycycline asd,  to f/u any worsening symptoms or concerns

## 2018-08-29 ENCOUNTER — Ambulatory Visit
Admission: EM | Admit: 2018-08-29 | Discharge: 2018-08-29 | Disposition: A | Discharge status: Home / Self Care | Payer: No Typology Code available for payment source | Attending: Physician Assistant | Admitting: Physician Assistant

## 2018-08-29 DIAGNOSIS — R509 Fever, unspecified: Secondary | ICD-10-CM

## 2018-08-29 DIAGNOSIS — R05 Cough: Secondary | ICD-10-CM

## 2018-08-29 DIAGNOSIS — J3489 Other specified disorders of nose and nasal sinuses: Secondary | ICD-10-CM

## 2018-08-29 DIAGNOSIS — R6889 Other general symptoms and signs: Secondary | ICD-10-CM

## 2018-08-29 DIAGNOSIS — R0981 Nasal congestion: Secondary | ICD-10-CM | POA: Diagnosis not present

## 2018-08-29 DIAGNOSIS — R52 Pain, unspecified: Secondary | ICD-10-CM

## 2018-08-29 MED ORDER — ONDANSETRON 4 MG PO TBDP: 4.0000 mg | ORAL_TABLET | Freq: Three times a day (TID) | ORAL | 0 refills | Status: DC | PRN

## 2018-08-29 MED ORDER — OSELTAMIVIR PHOSPHATE 75 MG PO CAPS: 75.0000 mg | ORAL_CAPSULE | Freq: Two times a day (BID) | ORAL | 0 refills | Status: DC

## 2018-08-29 MED ORDER — ACETAMINOPHEN 325 MG PO TABS
975.0000 mg | ORAL_TABLET | Freq: Once | ORAL | Status: AC
  Administered 2018-08-29: 975 mg via ORAL

## 2018-08-29 NOTE — ED Provider Notes (Signed)
[name]    CSN: [id number] Arrival date & time: 08/29/18  1700     History   Chief Complaint Chief Complaint  Patient presents with  . Influenza    HPI Dominique Lane is a 58 y.o. female.   58 year old female comes in for 1 day history of flulike symptoms.  Has had rhinorrhea, nasal congestion, cough, body aches, chills, fever.  Has had stress incontinence due to cough.  Has had mild nausea without vomiting.  Denies abdominal pain, diarrhea.  Has a positive sick contact.  Never smoker.  Had flu shot this year.  Patient states had recent travel 1 week ago to [facility]. Denies close contact with sick contacts at that time.      Past Medical History:  Diagnosis Date  . Adenomyosis   . Fibroid   . Headache(784.0)   . Heart murmur   . Hyperlipidemia   . MVP (mitral valve prolapse)    Antibiotics required    Patient Active Problem List   Diagnosis Date Noted  . Tick bites 12/09/2017  . Supraclavicular fossa fullness 12/18/2016  . Annual physical exam 12/05/2014  . Prediabetes 06/12/2014  . MRSA carrier 09/08/2012  . MVP (mitral valve prolapse)   . HYPERLIPIDEMIA 04/10/2007  . GERD (gastroesophageal reflux disease) 04/10/2007    Past Surgical History:  Procedure Laterality Date  . CARPAL TUNNEL RELEASE Bilateral   . CHOLECYSTECTOMY    . COLONOSCOPY  2011   Dr Amedeo Plenty  . fractured mandible  1997   repair  . GANGLION CYST EXCISION    . LAPAROSCOPIC LYSIS OF ADHESIONS N/A 09/13/2012   Procedure: LAPAROSCOPIC LYSIS OF ADHESIONS;  Surgeon: Terrance Mass, MD;  Location: [facility];  Service: Gynecology;  Laterality: N/A;  . LAPAROSCOPY N/A 09/13/2012   Procedure: LAPAROSCOPY OPERATIVE;  Surgeon: Terrance Mass, MD;  Location: [facility];  Service: Gynecology;  Laterality: N/A;  . OVARIAN CYST REMOVAL     Right  . ROTATOR CUFF REPAIR     twice on left and once on right  . SALPINGOOPHORECTOMY Bilateral 09/13/2012   Procedure: SALPINGO OOPHORECTOMY;   Surgeon: Terrance Mass, MD;  Location: [facility];  Service: Gynecology;  Laterality: Bilateral;  . TUBAL LIGATION    . VAGINAL HYSTERECTOMY      OB History    Gravida  2   Para  2   Term  2   Preterm      AB      Living  2     SAB      TAB      Ectopic      Multiple      Live Births               Home Medications    Prior to Admission medications   Medication Sig Start Date End Date Taking? Authorizing Provider  Ibuprofen-Famotidine (DUEXIS PO) Take 800 mg by mouth as needed.    [provider]  omeprazole (PRILOSEC) 40 MG capsule Take 1 capsule (40 mg total) by mouth daily. 07/21/17   Hoyt Koch, MD  ondansetron (ZOFRAN ODT) 4 MG disintegrating tablet Take 1 tablet (4 mg total) by mouth every 8 (eight) hours as needed for nausea or vomiting. 08/29/18   Tasia Catchings, Amy V, PA-C  oseltamivir (TAMIFLU) 75 MG capsule Take 1 capsule (75 mg total) by mouth every 12 (twelve) hours. 08/29/18   [name] PA-C    Family History Family  History  Problem Relation Age of Onset  . Heart disease Mother        ?  . Bladder Cancer Father 12  . Lymphoma Paternal Aunt   . Hypertension Sister   . Ovarian cancer Sister 43  . Leukemia Paternal Grandfather   . CAD Neg Hx   . Colon cancer Neg Hx   . Breast cancer Neg Hx     Social History Social History   Tobacco Use  . Smoking status: Never Smoker  . Smokeless tobacco: Never Used  Substance Use Topics  . Alcohol use: Yes    Alcohol/week: 0.0 standard drinks    Comment: occasionally; < 2 glasseswine / month  . Drug use: No     Allergies   Cephalosporins; Naproxen sodium; and Hydromet [hydrocodone-homatropine]   Review of Systems Review of Systems  Reason unable to perform ROS: See HPI as above.     Physical Exam Triage Vital Signs ED Triage Vitals  Enc Vitals Group     BP 08/29/18 1708 116/76     Pulse Rate 08/29/18 1708 (!) 125     Resp 08/29/18 1708 20     Temp 08/29/18 1708 (!) 101.2 F  (38.4 C)     Temp Source 08/29/18 1708 Oral     SpO2 08/29/18 1708 93 %     Weight --      Height --      Head Circumference --      Peak Flow --      Pain Score 08/29/18 1709 6     Pain Loc --      Pain Edu? --      Excl. in [place]? --    No data found.  Updated Vital Signs BP 116/76 (BP Location: Left Arm)   Pulse (!) 125   Temp (!) 101.2 F (38.4 C) (Oral)   Resp 20   SpO2 93%   Physical Exam Constitutional:      General: She is not in acute distress.    Appearance: She is well-developed. She is not ill-appearing, toxic-appearing or diaphoretic.  HENT:     Head: Normocephalic and atraumatic.     Right Ear: Tympanic membrane, ear canal and external ear normal. Tympanic membrane is not erythematous or bulging.     Left Ear: Tympanic membrane, ear canal and external ear normal. Tympanic membrane is not erythematous or bulging.     Nose: Nose normal.     Right Sinus: No maxillary sinus tenderness or frontal sinus tenderness.     Left Sinus: No maxillary sinus tenderness or frontal sinus tenderness.     Mouth/Throat:     Mouth: Mucous membranes are moist.     Pharynx: Oropharynx is clear. Uvula midline.  Eyes:     Conjunctiva/sclera: Conjunctivae normal.     Pupils: Pupils are equal, round, and reactive to light.  Neck:     Musculoskeletal: Normal range of motion and neck supple.  Cardiovascular:     Rate and Rhythm: Regular rhythm. Tachycardia present.     Heart sounds: Normal heart sounds. No murmur. No friction rub. No gallop.   Pulmonary:     Effort: Pulmonary effort is normal. No accessory muscle usage, prolonged expiration, respiratory distress or retractions.     Breath sounds: Normal breath sounds. No stridor, decreased air movement or transmitted upper airway sounds. No decreased breath sounds, wheezing, rhonchi or rales.  Skin:    General: Skin is warm and dry.  Neurological:  Mental Status: She is alert and oriented to person, place, and time.      UC  Treatments / Results  Labs (all labs ordered are listed, but only abnormal results are displayed) Labs Reviewed - No data to display  EKG None  Radiology No results found.  Procedures Procedures (including critical care time)  Medications Ordered in UC Medications  acetaminophen (TYLENOL) tablet 975 mg (975 mg Oral Given 08/29/18 1726)    Initial Impression / Assessment and Plan / UC Course  I have reviewed the triage vital signs and the nursing notes.  Pertinent labs & imaging results that were available during my care of the patient were reviewed by me and considered in my medical decision making (see chart for details).    Recheck O2 sat 96% at room air. Lungs clear to auscultation bilaterally without adventitious lung sounds. No shortness of breath, wheezing. Patient deferred flu testing. Will treat for flu like symptoms with tamiflu.  Other symptomatic treatment discussed.  Push fluids.  Return precautions given.  Patient expresses understanding and agrees to plan.  Final Clinical Impressions(s) / UC Diagnoses   Final diagnoses:  Flu-like symptoms    ED Prescriptions    Medication Sig Dispense Auth. Provider   oseltamivir (TAMIFLU) 75 MG capsule Take 1 capsule (75 mg total) by mouth every 12 (twelve) hours. 10 capsule Yu, Amy V, PA-C   ondansetron (ZOFRAN ODT) 4 MG disintegrating tablet Take 1 tablet (4 mg total) by mouth every 8 (eight) hours as needed for nausea or vomiting. 20 tablet Tobin Chad, [place] 08/29/18 1742

## 2018-08-29 NOTE — Discharge Instructions (Addendum)
Start tamiflu as directed. Zofran as needed for nausea/vomiting. You can use over the counter medicine such as flonase, zyrtec for nasal congestion/drainage. You can use over the counter nasal saline rinse such as neti pot for nasal congestion. Keep hydrated, your urine should be clear to pale yellow in color. Tylenol/motrin for fever and pain. Monitor for any worsening of symptoms, chest pain, shortness of breath, wheezing, swelling of the throat, follow up for reevaluation.

## 2018-08-29 NOTE — ED Triage Notes (Signed)
Pt c/o fever, head pain, cough, body aches since this am

## 2018-10-02 ENCOUNTER — Encounter: Payer: Self-pay | Admitting: Internal Medicine

## 2018-10-03 ENCOUNTER — Ambulatory Visit (INDEPENDENT_AMBULATORY_CARE_PROVIDER_SITE_OTHER): Payer: Medicare Other | Admitting: Internal Medicine

## 2018-10-03 ENCOUNTER — Encounter: Payer: Self-pay | Admitting: Internal Medicine

## 2018-10-03 DIAGNOSIS — R21 Rash and other nonspecific skin eruption: Secondary | ICD-10-CM | POA: Diagnosis not present

## 2018-10-03 MED ORDER — DOXYCYCLINE HYCLATE 100 MG PO TABS: 200.0000 mg | ORAL_TABLET | Freq: Once | ORAL | 0 refills | Status: AC

## 2018-10-03 NOTE — Assessment & Plan Note (Signed)
Rx for doxycycline 200 mg single dose for prophylaxis against lyme disease which is consistent with current guidelines given time of exposure of the tick on her back.

## 2018-10-03 NOTE — Progress Notes (Signed)
Virtual Visit via Video Note  I connected with Dominique Lane on 10/03/18 at  9:40 AM EDT by a video enabled telemedicine application and verified that I am speaking with the correct person using two identifiers.   I discussed the limitations of evaluation and management by telemedicine and the availability of in person appointments. The patient expressed understanding and agreed to proceed.  History of Present Illness: The patient is a 58 y.o. YO female with visit for tick on her back and rash on the. Started itching and feeling bad last night. They used alcohol on the back which did cause it to release. It is still itchy and mildly painful. Has been in hospital with tick bite infection in the past and this makes her very anxious. Noticed tick after being outside and removed within an unknown time but was outside about 3-4 days ago last. Denies fevers or chills. Overall it is stable since onset. Has tried alcohol on the area.  Observations/Objective: Appearance: normal, breathing appears normal, normal grooming, abdomen does not appear distended, back with red rash with central irritation, mental status is A and O times 3  Assessment and Plan: See problem oriented charting  Follow Up Instructions: rx for doxycycline 200 mg single dose for prophylaxis against lyme disease  I discussed the assessment and treatment plan with the patient. The patient was provided an opportunity to ask questions and all were answered. The patient agreed with the plan and demonstrated an understanding of the instructions.   The patient was advised to call back or seek an in-person evaluation if the symptoms worsen or if the condition fails to improve as anticipated.  Hoyt Koch, MD

## 2018-10-05 ENCOUNTER — Encounter: Payer: Self-pay | Admitting: Internal Medicine

## 2018-10-19 ENCOUNTER — Encounter: Payer: Self-pay | Admitting: Internal Medicine

## 2018-12-27 DIAGNOSIS — M8580 Other specified disorders of bone density and structure, unspecified site: Secondary | ICD-10-CM

## 2018-12-27 HISTORY — DX: Other specified disorders of bone density and structure, unspecified site: M85.80

## 2019-01-02 ENCOUNTER — Telehealth: Payer: Self-pay | Admitting: *Deleted

## 2019-01-02 DIAGNOSIS — M8580 Other specified disorders of bone density and structure, unspecified site: Secondary | ICD-10-CM

## 2019-01-02 NOTE — Telephone Encounter (Signed)
Patient scheduled for July 23.

## 2019-01-02 NOTE — Telephone Encounter (Signed)
Patient called requesting to schedule dexa, new order placed, will have appointment desk call to schedule.

## 2019-01-17 ENCOUNTER — Other Ambulatory Visit: Payer: Self-pay

## 2019-01-18 ENCOUNTER — Ambulatory Visit (INDEPENDENT_AMBULATORY_CARE_PROVIDER_SITE_OTHER): Payer: Medicare Other

## 2019-01-18 DIAGNOSIS — Z780 Asymptomatic menopausal state: Secondary | ICD-10-CM

## 2019-01-18 DIAGNOSIS — M8580 Other specified disorders of bone density and structure, unspecified site: Secondary | ICD-10-CM

## 2019-01-18 DIAGNOSIS — M8589 Other specified disorders of bone density and structure, multiple sites: Secondary | ICD-10-CM

## 2019-01-29 ENCOUNTER — Other Ambulatory Visit: Payer: Self-pay | Admitting: Orthopedic Surgery

## 2019-01-29 DIAGNOSIS — M25512 Pain in left shoulder: Secondary | ICD-10-CM

## 2019-02-01 ENCOUNTER — Telehealth: Payer: Self-pay | Admitting: Internal Medicine

## 2019-02-01 ENCOUNTER — Encounter: Payer: Self-pay | Admitting: Internal Medicine

## 2019-02-01 ENCOUNTER — Other Ambulatory Visit: Payer: Self-pay | Admitting: Gynecology

## 2019-02-01 ENCOUNTER — Encounter: Payer: Self-pay | Admitting: Gynecology

## 2019-02-01 ENCOUNTER — Ambulatory Visit (INDEPENDENT_AMBULATORY_CARE_PROVIDER_SITE_OTHER): Payer: Medicare Other | Admitting: Internal Medicine

## 2019-02-01 ENCOUNTER — Other Ambulatory Visit: Payer: Self-pay

## 2019-02-01 ENCOUNTER — Ambulatory Visit (INDEPENDENT_AMBULATORY_CARE_PROVIDER_SITE_OTHER): Payer: Medicare Other | Admitting: Gynecology

## 2019-02-01 ENCOUNTER — Other Ambulatory Visit (INDEPENDENT_AMBULATORY_CARE_PROVIDER_SITE_OTHER): Payer: Medicare Other

## 2019-02-01 VITALS — BP 112/80 | HR 76 | Temp 98.5°F | Ht 69.0 in | Wt 213.0 lb

## 2019-02-01 VITALS — BP 126/70 | Ht 68.5 in | Wt 212.0 lb

## 2019-02-01 DIAGNOSIS — Z0000 Encounter for general adult medical examination without abnormal findings: Secondary | ICD-10-CM

## 2019-02-01 DIAGNOSIS — K219 Gastro-esophageal reflux disease without esophagitis: Secondary | ICD-10-CM

## 2019-02-01 DIAGNOSIS — M8589 Other specified disorders of bone density and structure, multiple sites: Secondary | ICD-10-CM

## 2019-02-01 DIAGNOSIS — R3915 Urgency of urination: Secondary | ICD-10-CM | POA: Diagnosis not present

## 2019-02-01 DIAGNOSIS — Z01419 Encounter for gynecological examination (general) (routine) without abnormal findings: Secondary | ICD-10-CM

## 2019-02-01 DIAGNOSIS — M8580 Other specified disorders of bone density and structure, unspecified site: Secondary | ICD-10-CM

## 2019-02-01 DIAGNOSIS — N952 Postmenopausal atrophic vaginitis: Secondary | ICD-10-CM

## 2019-02-01 DIAGNOSIS — Z780 Asymptomatic menopausal state: Secondary | ICD-10-CM

## 2019-02-01 LAB — LIPID PANEL
Cholesterol: 273 mg/dL — ABNORMAL HIGH (ref 0–200)
HDL: 44.8 mg/dL (ref >39.00)
LDL Cholesterol: 196 mg/dL — ABNORMAL HIGH (ref 0–99)
NonHDL: 228.12
Total CHOL/HDL Ratio: 6
Triglycerides: 159 mg/dL — ABNORMAL HIGH (ref 0.0–149.0)
VLDL: 31.8 mg/dL (ref 0.0–40.0)

## 2019-02-01 LAB — COMPREHENSIVE METABOLIC PANEL
ALT: 18 U/L (ref 0–35)
AST: 19 U/L (ref 0–37)
Albumin: 4.5 g/dL (ref 3.5–5.2)
Alkaline Phosphatase: 106 U/L (ref 39–117)
BUN: 16 mg/dL (ref 6–23)
CO2: 25 mEq/L (ref 19–32)
Calcium: 9.4 mg/dL (ref 8.4–10.5)
Chloride: 105 mEq/L (ref 96–112)
Creatinine, Ser: 0.82 mg/dL (ref 0.40–1.20)
GFR: 71.06 mL/min (ref >60.00)
Glucose, Bld: 88 mg/dL (ref 70–99)
Potassium: 4 mEq/L (ref 3.5–5.1)
Sodium: 138 mEq/L (ref 135–145)
Total Bilirubin: 0.5 mg/dL (ref 0.2–1.2)
Total Protein: 7.4 g/dL (ref 6.0–8.3)

## 2019-02-01 LAB — CBC
HCT: 40.3 % (ref 36.0–46.0)
Hemoglobin: 13.4 g/dL (ref 12.0–15.0)
MCHC: 33.1 g/dL (ref 30.0–36.0)
MCV: 88.9 fl (ref 78.0–100.0)
Platelets: 233 10*3/uL (ref 150.0–400.0)
RBC: 4.54 Mil/uL (ref 3.87–5.11)
RDW: 12.9 % (ref 11.5–15.5)
WBC: 6.1 10*3/uL (ref 4.0–10.5)

## 2019-02-01 LAB — HEMOGLOBIN A1C: Hgb A1c MFr Bld: 6.1 % (ref 4.6–6.5)

## 2019-02-01 MED ORDER — PANTOPRAZOLE SODIUM 40 MG PO TBEC: 40.0000 mg | DELAYED_RELEASE_TABLET | Freq: Every day | ORAL | 3 refills | Status: DC

## 2019-02-01 NOTE — Assessment & Plan Note (Signed)
Rx pantoprazole which she uses for a couple weeks at a time for flares.

## 2019-02-01 NOTE — Progress Notes (Signed)
   Subjective:   Patient ID: Dominique Lane, female    DOB: Oct 04, 1960, 58 y.o.   MRN: [id number]  HPI The patient is a 58 YO female coming in for physical.   PMH, [place], social history reviewed and updated  Review of Systems  Constitutional: Negative.   HENT: Negative.   Eyes: Negative.   Respiratory: Negative for cough, chest tightness and shortness of breath.   Cardiovascular: Negative for chest pain, palpitations and leg swelling.  Gastrointestinal: Negative for abdominal distention, abdominal pain, constipation, diarrhea, nausea and vomiting.  Musculoskeletal: Positive for arthralgias.  Skin: Negative.   Neurological: Negative.   Psychiatric/Behavioral: Negative.     Objective:  Physical Exam Constitutional:      Appearance: She is well-developed.  HENT:     Head: Normocephalic and atraumatic.  Neck:     Musculoskeletal: Normal range of motion.  Cardiovascular:     Rate and Rhythm: Normal rate and regular rhythm.  Pulmonary:     Effort: Pulmonary effort is normal. No respiratory distress.     Breath sounds: Normal breath sounds. No wheezing or rales.  Abdominal:     General: Bowel sounds are normal. There is no distension.     Palpations: Abdomen is soft.     Tenderness: There is no abdominal tenderness. There is no rebound.  Musculoskeletal:        General: Tenderness present.     Comments: Pain left shoulder  Skin:    General: Skin is warm and dry.  Neurological:     Mental Status: She is alert and oriented to person, place, and time.     Coordination: Coordination normal.     Vitals:   02/01/19 1041  BP: 112/80  Pulse: 76  Temp: 98.5 F (36.9 C)  TempSrc: Temporal  SpO2: 95%  Weight: 213 lb (96.6 kg)  Height: 5\' 9"  (1.753 m)    Assessment & Plan:

## 2019-02-01 NOTE — Patient Instructions (Signed)
Follow-up 1 year, sooner as needed

## 2019-02-01 NOTE — Patient Instructions (Signed)
Health Maintenance, Female Adopting a healthy lifestyle and getting preventive care are important in promoting health and wellness. Ask your health care provider about:  The right schedule for you to have regular tests and exams.  Things you can do on your own to prevent diseases and keep yourself healthy. What should I know about diet, weight, and exercise? Eat a healthy diet   Eat a diet that includes plenty of vegetables, fruits, low-fat dairy products, and lean protein.  Do not eat a lot of foods that are high in solid fats, added sugars, or sodium. Maintain a healthy weight Body mass index (BMI) is used to identify weight problems. It estimates body fat based on height and weight. Your health care provider can help determine your BMI and help you achieve or maintain a healthy weight. Get regular exercise Get regular exercise. This is one of the most important things you can do for your health. Most adults should:  Exercise for at least 150 minutes each week. The exercise should increase your heart rate and make you sweat (moderate-intensity exercise).  Do strengthening exercises at least twice a week. This is in addition to the moderate-intensity exercise.  Spend less time sitting. Even light physical activity can be beneficial. Watch cholesterol and blood lipids Have your blood tested for lipids and cholesterol at 60 years of age, then have this test every 5 years. Have your cholesterol levels checked more often if:  Your lipid or cholesterol levels are high.  You are older than 60 years of age.  You are at high risk for heart disease. What should I know about cancer screening? Depending on your health history and family history, you may need to have cancer screening at various ages. This may include screening for:  Breast cancer.  Cervical cancer.  Colorectal cancer.  Skin cancer.  Lung cancer. What should I know about heart disease, diabetes, and high blood  pressure? Blood pressure and heart disease  High blood pressure causes heart disease and increases the risk of stroke. This is more likely to develop in people who have high blood pressure readings, are of African descent, or are overweight.  Have your blood pressure checked: ? Every 3-5 years if you are 18-39 years of age. ? Every year if you are 40 years old or older. Diabetes Have regular diabetes screenings. This checks your fasting blood sugar level. Have the screening done:  Once every three years after age 40 if you are at a normal weight and have a low risk for diabetes.  More often and at a younger age if you are overweight or have a high risk for diabetes. What should I know about preventing infection? Hepatitis B If you have a higher risk for hepatitis B, you should be screened for this virus. Talk with your health care provider to find out if you are at risk for hepatitis B infection. Hepatitis C Testing is recommended for:  Everyone born from 1945 through 1965.  Anyone with known risk factors for hepatitis C. Sexually transmitted infections (STIs)  Get screened for STIs, including gonorrhea and chlamydia, if: ? You are sexually active and are younger than 60 years of age. ? You are older than 60 years of age and your health care provider tells you that you are at risk for this type of infection. ? Your sexual activity has changed since you were last screened, and you are at increased risk for chlamydia or gonorrhea. Ask your health care provider if   you are at risk.  Ask your health care provider about whether you are at high risk for HIV. Your health care provider may recommend a prescription medicine to help prevent HIV infection. If you choose to take medicine to prevent HIV, you should first get tested for HIV. You should then be tested every 3 months for as long as you are taking the medicine. Pregnancy  If you are about to stop having your period (premenopausal) and  you may become pregnant, seek counseling before you get pregnant.  Take 400 to 800 micrograms (mcg) of folic acid every day if you become pregnant.  Ask for birth control (contraception) if you want to prevent pregnancy. Osteoporosis and menopause Osteoporosis is a disease in which the bones lose minerals and strength with aging. This can result in bone fractures. If you are 65 years old or older, or if you are at risk for osteoporosis and fractures, ask your health care provider if you should:  Be screened for bone loss.  Take a calcium or vitamin D supplement to lower your risk of fractures.  Be given hormone replacement therapy (HRT) to treat symptoms of menopause. Follow these instructions at home: Lifestyle  Do not use any products that contain nicotine or tobacco, such as cigarettes, e-cigarettes, and chewing tobacco. If you need help quitting, ask your health care provider.  Do not use street drugs.  Do not share needles.  Ask your health care provider for help if you need support or information about quitting drugs. Alcohol use  Do not drink alcohol if: ? Your health care provider tells you not to drink. ? You are pregnant, may be pregnant, or are planning to become pregnant.  If you drink alcohol: ? Limit how much you use to 0-1 drink a day. ? Limit intake if you are breastfeeding.  Be aware of how much alcohol is in your drink. In the [place] one drink equals one 12 oz bottle of beer (355 mL), one 5 oz glass of wine (148 mL), or one 1 oz glass of hard liquor (44 mL). General instructions  Schedule regular health, dental, and eye exams.  Stay current with your vaccines.  Tell your health care provider if: ? You often feel depressed. ? You have ever been abused or do not feel safe at home. Summary  Adopting a healthy lifestyle and getting preventive care are important in promoting health and wellness.  Follow your health care provider's instructions about healthy  diet, exercising, and getting tested or screened for diseases.  Follow your health care provider's instructions on monitoring your cholesterol and blood pressure. This information is not intended to replace advice given to you by your health care provider. Make sure you discuss any questions you have with your health care provider. Document Released: 12/28/2010 Document Revised: 06/07/2018 Document Reviewed: 06/07/2018 Elsevier Patient Education  2020 Elsevier Inc.  

## 2019-02-01 NOTE — Telephone Encounter (Signed)
Rec'd from [facility] forwarded 2 pages to [facility]

## 2019-02-01 NOTE — Progress Notes (Signed)
    Dominique Lane Dominique Lane Sep 15, 1960 [id number]        58 y.o.  [id number] for annual gynecologic exam.  Without gynecologic complaints.  She does note some urinary urgency when she voids.  No issues with incontinence.  Has been going on for years.  Past medical history,surgical history, problem list, medications, allergies, family history and social history were all reviewed and documented as reviewed in the EPIC chart.  ROS:  Performed with pertinent positives and negatives included in the history, assessment and plan.   Additional significant findings : None   Exam: Copywriter, advertising Vitals:   02/01/19 1450  BP: 126/70  Weight: 212 lb (96.2 kg)  Height: 5' 8.5" (1.74 m)   Body mass index is 31.77 kg/m.  General appearance:  Normal affect, orientation and appearance. Skin: Grossly normal HEENT: Without gross lesions.  No cervical or supraclavicular adenopathy. Thyroid normal.  Lungs:  Clear without wheezing, rales or rhonchi Cardiac: RR, without RMG Abdominal:  Soft, nontender, without masses, guarding, rebound, organomegaly or hernia Breasts:  Examined lying and sitting without masses, retractions, discharge or axillary adenopathy. Pelvic:  Ext, BUS, Vagina: With atrophic changes  Adnexa: Without masses or tenderness    Anus and perineum: Normal   Rectovaginal: Normal sphincter tone without palpated masses or tenderness.    Assessment/Plan:  58 y.o. [id number] female for annual gynecologic exam.  Status post TVH with subsequent BSO in the past.  1. Postmenopausal.  No significant menopausal symptoms. 2. Urinary urgency.  Voids every several hours.  No issues with incontinence.  Has been going on for years.  Will check baseline urine analysis.  Discussed bladder stimulants such as caffeine and behavior modification.  Not an issue to require medication at this point. 3. Mammography today awaiting results.  Breast exam normal today. 4. Osteopenia.  DEXA 2005 with T score -1.6.  Had  DEXA 2 weeks ago but results are not in the chart.  Will run these down and follow-up with her in reference to this. 5. Colonoscopy 2011.  Repeat colonoscopy next year at 10-year interval. 6. Pap smear 2019.  No Pap smear done today.  History of abnormal Pap smears over 30 years ago with normal Pap smears since.  Options to stop screening per current screening guidelines reviewed. 7. Health maintenance.  No routine lab work done as patient does this elsewhere.  Follow-up 1 year, sooner as needed.   Anastasio Auerbach MD, 3:10 PM 02/01/2019

## 2019-02-01 NOTE — Assessment & Plan Note (Signed)
Flu shot yearly. Shingrix declines. Tetanus up to date. Colonoscopy up to date. Mammogram up to date, pap smear up to date. Counseled about sun safety and mole surveillance. Counseled about the dangers of distracted driving. Given 10 year screening recommendations.

## 2019-02-02 ENCOUNTER — Encounter: Payer: Self-pay | Admitting: Gynecology

## 2019-02-02 LAB — URINALYSIS, COMPLETE W/RFL CULTURE
Bacteria, UA: NONE SEEN /HPF
Bilirubin Urine: NEGATIVE
Glucose, UA: NEGATIVE
Hgb urine dipstick: NEGATIVE
Hyaline Cast: NONE SEEN /LPF
Ketones, ur: NEGATIVE
Leukocyte Esterase: NEGATIVE
Nitrites, Initial: NEGATIVE
Protein, ur: NEGATIVE
RBC / HPF: NONE SEEN /HPF (ref 0–2)
Specific Gravity, Urine: 1.025 (ref 1.001–1.03)
Squamous Epithelial / HPF: NONE SEEN /HPF (ref <=5)
WBC, UA: NONE SEEN /HPF (ref 0–5)
pH: <=5 (ref 5.0–8.0)

## 2019-02-02 LAB — NO CULTURE INDICATED

## 2019-02-09 ENCOUNTER — Encounter: Payer: Self-pay | Admitting: Neurology

## 2019-02-12 ENCOUNTER — Other Ambulatory Visit: Payer: Self-pay

## 2019-02-12 DIAGNOSIS — G8929 Other chronic pain: Secondary | ICD-10-CM

## 2019-02-24 ENCOUNTER — Other Ambulatory Visit: Payer: Medicare Other

## 2019-03-15 ENCOUNTER — Encounter: Payer: Medicare Other | Admitting: Neurology

## 2019-03-20 ENCOUNTER — Encounter: Payer: Self-pay | Admitting: Internal Medicine

## 2019-03-20 ENCOUNTER — Other Ambulatory Visit: Payer: Self-pay

## 2019-03-20 DIAGNOSIS — Z20822 Contact with and (suspected) exposure to covid-19: Secondary | ICD-10-CM

## 2019-03-22 ENCOUNTER — Ambulatory Visit (INDEPENDENT_AMBULATORY_CARE_PROVIDER_SITE_OTHER): Payer: Medicare Other

## 2019-03-22 ENCOUNTER — Ambulatory Visit
Admission: EM | Admit: 2019-03-22 | Discharge: 2019-03-22 | Disposition: A | Discharge status: Home / Self Care | Payer: Medicare Other | Attending: Physician Assistant | Admitting: Physician Assistant

## 2019-03-22 DIAGNOSIS — U071 COVID-19: Secondary | ICD-10-CM

## 2019-03-22 DIAGNOSIS — J988 Other specified respiratory disorders: Secondary | ICD-10-CM | POA: Diagnosis not present

## 2019-03-22 LAB — NOVEL CORONAVIRUS, NAA: SARS-CoV-2, NAA: DETECTED — AB

## 2019-03-22 MED ORDER — PREDNISONE 50 MG PO TABS: 50.0000 mg | ORAL_TABLET | Freq: Every day | ORAL | 0 refills | Status: DC

## 2019-03-22 MED ORDER — BENZONATATE 100 MG PO CAPS: 100.0000 mg | ORAL_CAPSULE | Freq: Three times a day (TID) | ORAL | 0 refills | Status: DC

## 2019-03-22 NOTE — ED Triage Notes (Signed)
Pt c/o cough and fever since Saturday. States tested positive for COVID. C/o coughing and nasal congestion.

## 2019-03-22 NOTE — ED Provider Notes (Signed)
[name]    CSN: [id number] Arrival date & time: 03/22/19  0931      History   Chief Complaint Chief Complaint  Patient presents with  . Cough    HPI Dominique Lane is a 58 y.o. female.   58 year old female comes in for worsening cough after being diagnosed with COVID. Patient started having symptoms 6 days ago, including cough, rhinorrhea, nasal congestion, abdominal pain, diarrhea. Tmax 101. She has not had a fever since 3 days ago, though has had temps of 100.1-100.2. she denies sore throat, shortness of breath, trouble breathing. Denies nausea/vomiting. Denies loss of taste/smell. Abdominal pain is epigastric, describes as burning sensation and attributes this to taking cough syrup. Never smoker. She has been taking tylenol for fever. States her test came back yesterday to be positive for COVID. Given continued cough, she came in for further evaluation.      Past Medical History:  Diagnosis Date  . Headache(784.0)   . Heart murmur   . Hyperlipidemia   . MVP (mitral valve prolapse)    Antibiotics required  . Osteopenia 12/2018   T score of -1.9 FRAX 8.8% / 1.0%    Patient Active Problem List   Diagnosis Date Noted  . Supraclavicular fossa fullness 12/18/2016  . Annual physical exam 12/05/2014  . Prediabetes 06/12/2014  . MRSA carrier 09/08/2012  . MVP (mitral valve prolapse)   . HYPERLIPIDEMIA 04/10/2007  . GERD (gastroesophageal reflux disease) 04/10/2007    Past Surgical History:  Procedure Laterality Date  . CARPAL TUNNEL RELEASE Bilateral   . CHOLECYSTECTOMY    . COLONOSCOPY  2011   Dr Amedeo Plenty  . fractured mandible  1997   repair  . GANGLION CYST EXCISION    . LAPAROSCOPIC LYSIS OF ADHESIONS N/A 09/13/2012   Procedure: LAPAROSCOPIC LYSIS OF ADHESIONS;  Surgeon: Terrance Mass, MD;  Location: [facility];  Service: Gynecology;  Laterality: N/A;  . LAPAROSCOPY N/A 09/13/2012   Procedure: LAPAROSCOPY OPERATIVE;  Surgeon: Terrance Mass, MD;   Location: [facility];  Service: Gynecology;  Laterality: N/A;  . OVARIAN CYST REMOVAL     Right  . ROTATOR CUFF REPAIR     twice on left and once on right  . SALPINGOOPHORECTOMY Bilateral 09/13/2012   Procedure: SALPINGO OOPHORECTOMY;  Surgeon: Terrance Mass, MD;  Location: [facility];  Service: Gynecology;  Laterality: Bilateral;  . TUBAL LIGATION    . VAGINAL HYSTERECTOMY      OB History    Gravida  2   Para  2   Term  2   Preterm      AB      Living  2     SAB      TAB      Ectopic      Multiple      Live Births               Home Medications    Prior to Admission medications   Medication Sig Start Date End Date Taking? Authorizing Provider  benzonatate (TESSALON) 100 MG capsule Take 1 capsule (100 mg total) by mouth every 8 (eight) hours. 03/22/19   [name], PA-C  predniSONE (DELTASONE) 50 MG tablet Take 1 tablet (50 mg total) by mouth daily with breakfast. 03/22/19   [name], PA-C    Family History Family History  Problem Relation Age of Onset  . Heart disease Mother        ?  Marland Kitchen  Bladder Cancer Father 41  . Lymphoma Paternal Aunt   . Hypertension Sister   . Ovarian cancer Sister 60  . Leukemia Paternal Grandfather   . CAD Neg Hx   . Colon cancer Neg Hx   . Breast cancer Neg Hx     Social History Social History   Tobacco Use  . Smoking status: Never Smoker  . Smokeless tobacco: Never Used  Substance Use Topics  . Alcohol use: Yes    Alcohol/week: 0.0 standard drinks    Comment: occasionally; < 2 glasseswine / month  . Drug use: No     Allergies   Cephalosporins, Naproxen sodium, and Hydromet [hydrocodone-homatropine]   Review of Systems Review of Systems  Reason unable to perform ROS: See HPI as above.     Physical Exam Triage Vital Signs ED Triage Vitals [03/22/19 0941]  Enc Vitals Group     BP 125/81     Pulse Rate 62     Resp 18     Temp 98 F (36.7 C)     Temp Source Oral     SpO2 98 %     Weight      Height       Head Circumference      Peak Flow      Pain Score 0     Pain Loc      Pain Edu?      Excl. in [place]?    No data found.  Updated Vital Signs BP 125/81 (BP Location: Left Arm)   Pulse 62   Temp 98 F (36.7 C) (Oral)   Resp 18   SpO2 98%   Physical Exam Constitutional:      General: She is not in acute distress.    Appearance: Normal appearance. She is not ill-appearing, toxic-appearing or diaphoretic.  HENT:     Head: Normocephalic and atraumatic.     Mouth/Throat:     Mouth: Mucous membranes are moist.     Pharynx: Oropharynx is clear. Uvula midline.  Neck:     Musculoskeletal: Normal range of motion and neck supple.  Cardiovascular:     Rate and Rhythm: Normal rate and regular rhythm.     Heart sounds: Normal heart sounds. No murmur. No friction rub. No gallop.   Pulmonary:     Effort: Pulmonary effort is normal. No accessory muscle usage, prolonged expiration, respiratory distress or retractions.     Comments: Lungs clear to auscultation without adventitious lung sounds. Neurological:     General: No focal deficit present.     Mental Status: She is alert and oriented to person, place, and time.      UC Treatments / Results  Labs (all labs ordered are listed, but only abnormal results are displayed) Labs Reviewed - No data to display  EKG   Radiology Dg Chest 2 View  Result Date: 03/22/2019 CLINICAL DATA:  Productive cough, congestion and fever since 03/17/2019. EXAM: CHEST - 2 VIEW COMPARISON:  PA and lateral chest 01/09/2015. FINDINGS: The lungs are clear. Heart size is normal. No pneumothorax or pleural fluid. No acute or focal bony abnormality. IMPRESSION: Negative chest. Electronically Signed   By: Inge Rise M.D.   On: 03/22/2019 10:22    Procedures Procedures (including critical care time)  Medications Ordered in UC Medications - No data to display  Initial Impression / Assessment and Plan / UC Course  I have reviewed the triage vital signs and  the nursing notes.  Pertinent labs &  imaging results that were available during my care of the patient were reviewed by me and considered in my medical decision making (see chart for details).    CXR negative for pneumonia. Prednisone as directed. Other symptomatic treatment discussed. Discussed when to leave quarantine. Return precautions given. Patient expresses understanding and agrees to plan.  Patient with allergy to hydromet with reaction as itching. Discussed this is more side effect vs allergy. Patient does not recall symptoms. Will try prednisone and tessalon for now. OK to call in tussionex/hydromet if needed.    Final Clinical Impressions(s) / UC Diagnoses   Final diagnoses:  [id number]    ED Prescriptions    Medication Sig Dispense Auth. Provider   predniSONE (DELTASONE) 50 MG tablet Take 1 tablet (50 mg total) by mouth daily with breakfast. 5 tablet ,  V, PA-C   benzonatate (TESSALON) 100 MG capsule Take 1 capsule (100 mg total) by mouth every 8 (eight) hours. 21 capsule Tasia Catchings,  V, PA-C     I have reviewed the PDMP during this encounter.   [name], PA-C 03/22/19 1036

## 2019-03-22 NOTE — Discharge Instructions (Signed)
Chest xray negative for pneumonia. Prednisone for bronchitis and cough. Tessalon as needed for additional relief from cough. Can use over the counter flonase/nasacort for nasal congestion/drainage. Keep hydrated, urine should be clear to pale yellow in color. You can leave quarantine when you are 10 days since symptoms onset and at least 24 hours of no fever, no symptoms. No retesting needed, you can test positive up to 1-2 months even if you are no longer contagious. If experiencing shortness of breath, go to the ED for further evaluation needed.

## 2019-03-23 ENCOUNTER — Telehealth: Payer: Self-pay | Admitting: Emergency Medicine

## 2019-03-23 NOTE — Telephone Encounter (Signed)
Checked in on patient, discussed medications, and encouraged return call with any continuing questions or concerns.    

## 2019-03-26 ENCOUNTER — Telehealth: Payer: Self-pay | Admitting: Physician Assistant

## 2019-03-26 MED ORDER — DOXYCYCLINE HYCLATE 100 MG PO CAPS: 100.0000 mg | ORAL_CAPSULE | Freq: Two times a day (BID) | ORAL | 0 refills | Status: DC

## 2019-03-26 NOTE — Telephone Encounter (Signed)
58 year old female calls back for continued cough and recurrent fever after being seen 03/22/2019.  She was tested positive for COVID 10 days ago.  She had came in for evaluation on 03/22/2019 due to continued cough.  At that time, chest x-ray was negative for active cardiopulmonary disease, and was given prednisone and Tessalon for symptomatic relief.  Patient states has been taking as directed without much relief.  Yesterday, spiked fever of 101, and therefore called office today.  She continues to deny shortness of breath, trouble breathing.  Will start patient on doxycycline to cover for bacterial causes of symptoms.  Return precautions given.  Patient expresses understanding and agrees to plan.

## 2019-03-27 ENCOUNTER — Encounter: Payer: Self-pay | Admitting: Gynecology

## 2019-04-03 ENCOUNTER — Encounter: Payer: Medicare Other | Admitting: Neurology

## 2019-04-03 ENCOUNTER — Encounter

## 2019-04-13 ENCOUNTER — Telehealth: Payer: Self-pay

## 2019-04-13 NOTE — Telephone Encounter (Signed)
Copied from [place] [id number]. Topic: General - Other >> Apr 13, 2019 12:00 PM Carolyn Stare wrote: Pt tested pos in Sept for COVID would like to get a flu shot and is asking how long she has to wait or should she  reteste  She has no symptons

## 2019-04-13 NOTE — Telephone Encounter (Signed)
Patient informed of MD response and stated understanding  

## 2019-04-13 NOTE — Telephone Encounter (Signed)
Okay to get now. Can give flu with mild respiratory symptoms (with or without fever) or as soon as feeling better.

## 2019-07-18 ENCOUNTER — Other Ambulatory Visit: Payer: Self-pay | Admitting: Obstetrics and Gynecology

## 2019-07-18 ENCOUNTER — Other Ambulatory Visit: Payer: Self-pay | Admitting: Obstetrics & Gynecology

## 2019-07-18 ENCOUNTER — Other Ambulatory Visit: Payer: Self-pay

## 2019-07-18 DIAGNOSIS — Z1231 Encounter for screening mammogram for malignant neoplasm of breast: Secondary | ICD-10-CM

## 2019-08-21 DIAGNOSIS — H25813 Combined forms of age-related cataract, bilateral: Secondary | ICD-10-CM | POA: Diagnosis not present

## 2019-08-21 DIAGNOSIS — H52203 Unspecified astigmatism, bilateral: Secondary | ICD-10-CM | POA: Diagnosis not present

## 2019-08-21 DIAGNOSIS — H524 Presbyopia: Secondary | ICD-10-CM | POA: Diagnosis not present

## 2019-08-21 DIAGNOSIS — H5203 Hypermetropia, bilateral: Secondary | ICD-10-CM | POA: Diagnosis not present

## 2019-08-21 DIAGNOSIS — H02831 Dermatochalasis of right upper eyelid: Secondary | ICD-10-CM | POA: Diagnosis not present

## 2019-10-25 DIAGNOSIS — H02831 Dermatochalasis of right upper eyelid: Secondary | ICD-10-CM | POA: Diagnosis not present

## 2019-10-25 DIAGNOSIS — H02834 Dermatochalasis of left upper eyelid: Secondary | ICD-10-CM | POA: Diagnosis not present

## 2019-10-25 DIAGNOSIS — H02422 Myogenic ptosis of left eyelid: Secondary | ICD-10-CM | POA: Diagnosis not present

## 2019-11-27 ENCOUNTER — Other Ambulatory Visit: Payer: Self-pay

## 2019-11-27 ENCOUNTER — Encounter: Payer: Self-pay | Admitting: Internal Medicine

## 2019-11-27 ENCOUNTER — Ambulatory Visit (INDEPENDENT_AMBULATORY_CARE_PROVIDER_SITE_OTHER): Payer: Medicare Other | Admitting: Internal Medicine

## 2019-11-27 VITALS — BP 144/84 | HR 62 | Temp 98.1°F | Ht 68.5 in | Wt 200.0 lb

## 2019-11-27 DIAGNOSIS — R0789 Other chest pain: Secondary | ICD-10-CM | POA: Diagnosis not present

## 2019-11-27 NOTE — Progress Notes (Signed)
   Subjective:   Patient ID: Dominique Lane, female    DOB: 09-04-1960, 59 y.o.   MRN: [id number]  HPI The patient is a 59 YO female coming in for concerns about her heart. Previously had seen cardiology 2009. She is having a lot of stress in her life right now and having some intermittent chest pressure. Denies triggers. Denies pain in chest. Denies radiation anywhere. Does have GERD and only takes medication when needed for a short time then stops. 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 She is not exercising currently but denies chest pressure with walking uphill or up stairs.   Review of Systems  Constitutional: Negative.   HENT: Negative.   Eyes: Negative.   Respiratory: Positive for chest tightness. Negative for cough and shortness of breath.   Cardiovascular: Negative for chest pain, palpitations and leg swelling.  Gastrointestinal: Negative for abdominal distention, abdominal pain, constipation, diarrhea, nausea and vomiting.  Musculoskeletal: Negative.   Skin: Negative.   Neurological: Negative.   Psychiatric/Behavioral: Negative.     Objective:  Physical Exam Constitutional:      Appearance: She is well-developed.  HENT:     Head: Normocephalic and atraumatic.  Cardiovascular:     Rate and Rhythm: Normal rate and regular rhythm.  Pulmonary:     Effort: Pulmonary effort is normal. No respiratory distress.     Breath sounds: Normal breath sounds. No wheezing or rales.  Abdominal:     General: Bowel sounds are normal. There is no distension.     Palpations: Abdomen is soft.     Tenderness: There is no abdominal tenderness. There is no rebound.  Musculoskeletal:     Cervical back: Normal range of motion.  Skin:    General: Skin is warm and dry.  Neurological:     Mental Status: She is alert and oriented to person, place, and time.     Coordination: Coordination normal.     Vitals:   11/27/19 1411  BP: (!) 144/84   Pulse: 62  Temp: 98.1 F (36.7 C)  SpO2: 98%  Weight: 200 lb (90.7 kg)  Height: 5' 8.5" (1.74 m)   EKG: Rate 70, axis normal, interval normal, sinus, no st or t wave changes, when compared to 2015 no significant change.   This visit occurred during the SARS-CoV-2 public health emergency.  Safety protocols were in place, including screening questions prior to the visit, additional usage of staff PPE, and extensive cleaning of exam room while observing appropriate contact time as indicated for disinfecting solutions.   Assessment & Plan:

## 2019-11-27 NOTE — Patient Instructions (Signed)
The EKG we did today is normal and we will get you in with a cardiologist.

## 2019-11-27 NOTE — Assessment & Plan Note (Signed)
Could be related to stress/GERD. EKG done in the office which is unchanged. Referral to cardiology for consideration of further risk stratification.

## 2019-12-03 ENCOUNTER — Encounter: Payer: Self-pay | Admitting: Cardiology

## 2019-12-03 DIAGNOSIS — R072 Precordial pain: Secondary | ICD-10-CM | POA: Insufficient documentation

## 2019-12-03 DIAGNOSIS — Z7189 Other specified counseling: Secondary | ICD-10-CM | POA: Insufficient documentation

## 2019-12-03 NOTE — Progress Notes (Signed)
Cardiology Office Note   Date:  12/04/2019   ID:  Dominique Lane, DOB 06-06-1961, MRN [id number]  PCP:  Hoyt Koch, MD  Cardiologist:   No primary care provider on file. Referring:  Hoyt Koch, *    Chief Complaint  Patient presents with  . Chest Pain      History of Present Illness: Dominique Lane is a 59 y.o. female who presents for evaluation of chest pain.  She had an episode of this about a week ago.  It was coming and going over 2 days.  It was a little bit dull.  It was 6 out of 10 in intensity.  It was happening at rest.  She did not take anything except a baby aspirin.  She has some achy shoulders so she could not tell whether it radiated but she did not think that it did.  She did not have any jaw pain.  She had no associated nausea vomiting or diaphoresis.  She has not had any in several days.  She was doing some light exercise prior to this at [employer] and she was not getting any pain.  She had appropriate shortness of breath.  She has a lot of emotional stress.  She has never had any prior cardiac work-up other than a treadmill test years ago.  She does have dyslipidemia but really did not want to take a statin.   Past Medical History:  Diagnosis Date  . Hyperlipidemia   . MVP (mitral valve prolapse)   . Osteopenia 12/2018   T score of -1.9 FRAX 8.8% / 1.0%    Past Surgical History:  Procedure Laterality Date  . CARPAL TUNNEL RELEASE Bilateral   . CHOLECYSTECTOMY    . COLONOSCOPY  2011   Dr Amedeo Plenty  . fractured mandible  1997   repair  . GANGLION CYST EXCISION    . LAPAROSCOPIC LYSIS OF ADHESIONS N/A 09/13/2012   Procedure: LAPAROSCOPIC LYSIS OF ADHESIONS;  Surgeon: Terrance Mass, MD;  Location: [facility];  Service: Gynecology;  Laterality: N/A;  . LAPAROSCOPY N/A 09/13/2012   Procedure: LAPAROSCOPY OPERATIVE;  Surgeon: Terrance Mass, MD;  Location: [facility];  Service: Gynecology;  Laterality: N/A;  . OVARIAN CYST REMOVAL       Right  . ROTATOR CUFF REPAIR     twice on left and once on right  . SALPINGOOPHORECTOMY Bilateral 09/13/2012   Procedure: SALPINGO OOPHORECTOMY;  Surgeon: Terrance Mass, MD;  Location: [facility];  Service: Gynecology;  Laterality: Bilateral;  . TUBAL LIGATION    . VAGINAL HYSTERECTOMY       Current Outpatient Medications  Medication Sig Dispense Refill  . calcium gluconate 500 MG tablet Take 1 tablet by mouth daily.    . Multiple Vitamin (MULTI-VITAMIN) tablet Take by mouth daily.    . pantoprazole (PROTONIX) 40 MG tablet Take 40 mg by mouth as needed.     No current facility-administered medications for this visit.    Allergies:   Cephalosporins, Naproxen sodium, and Hydromet [hydrocodone-homatropine]    Social History:  The patient  reports that she has never smoked. She has never used smokeless tobacco. She reports current alcohol use. She reports that she does not use drugs.   Family History:  The patient's family history includes Bladder Cancer (age of onset: 48) in her father; Heart disease (age of onset: 25) in her mother; Hypertension in her sister; Leukemia in her paternal grandfather; Lymphoma in her paternal  aunt; Ovarian cancer (age of onset: 48) in her sister.    ROS:  Please see the history of present illness.   Otherwise, review of systems are positive for none.   All other systems are reviewed and negative.    PHYSICAL EXAM: VS:  BP 118/68   Pulse 78   Ht 5' 8.5" (1.74 m)   Wt 200 lb (90.7 kg)   SpO2 98%   BMI 29.97 kg/m  , BMI Body mass index is 29.97 kg/m. GENERAL:  Well appearing HEENT:  Pupils equal round and reactive, fundi not visualized, oral mucosa unremarkable NECK:  No jugular venous distention, waveform within normal limits, carotid upstroke brisk and symmetric, no bruits, no thyromegaly LYMPHATICS:  No cervical, inguinal adenopathy LUNGS:  Clear to auscultation bilaterally BACK:  No CVA tenderness CHEST:  Unremarkable HEART:  PMI not  displaced or sustained,S1 and S2 within normal limits, no S3, no S4, no clicks, no rubs, no murmurs ABD:  Flat, positive bowel sounds normal in frequency in pitch, no bruits, no rebound, no guarding, no midline pulsatile mass, no hepatomegaly, no splenomegaly EXT:  2 plus pulses throughout, no edema, no cyanosis no clubbing SKIN:  No rashes no nodules NEURO:  Cranial nerves II through XII grossly intact, motor grossly intact throughout PSYCH:  Cognitively intact, oriented to person place and time    EKG:  EKG is ordered today. The ekg ordered today demonstrates sinus rhythm, rate 78, axis within normal limits, intervals within normal limits, no acute ST-T wave changes.   Recent Labs: 02/01/2019: ALT 18; BUN 16; Creatinine, Ser 0.82; Hemoglobin 13.4; Platelets 233.0; Potassium 4.0; Sodium 138    Lipid Panel    Component Value Date/Time   CHOL 273 (H) 02/01/2019 1111   TRIG 159.0 (H) 02/01/2019 1111   HDL 44.80 02/01/2019 1111   CHOLHDL 6 02/01/2019 1111   VLDL 31.8 02/01/2019 1111   LDLCALC 196 (H) 02/01/2019 1111   LDLDIRECT 170.0 08/08/2017 1033      Wt Readings from Last 3 Encounters:  12/04/19 200 lb (90.7 kg)  11/27/19 200 lb (90.7 kg)  02/01/19 212 lb (96.2 kg)      Other studies Reviewed: Additional studies/ records that were reviewed today include: Labs. Review of the above records demonstrates:  Please see elsewhere in the note.     ASSESSMENT AND PLAN:  CHEST PAIN:   Her chest pain has some typical and atypical features.  I think the pretest probability of obstructive coronary disease is moderate.  I would like to screen her with a POET (Plain Old Exercise Treadmill) and a coronary calcium score.  We talked about this at length.  She understands the reasoning and how we would use this information to guide goals of therapy.  MVP: I do not hear any regurgitation.  She was told this was mild.  No change in therapy.  DYSLIPIDEMIA: Her LDL was 196.  She should be on  a statin based on this but she did not want to take this but she would consider it based on calcium score above.  We talked about the Mediterranean diet and exercise as well.  COVID EDUCATION: She has been vaccinated.  Current medicines are reviewed at length with the patient today.  The patient does not have concerns regarding medicines.  The following changes have been made:  no change  Labs/ tests ordered today include:   Orders Placed This Encounter  Procedures  . CT CARDIAC SCORING  . EXERCISE TOLERANCE TEST (  ETT)  . EKG 12-Lead     Disposition:   FU with me as needed and based on the results of the above.   Signed, Minus Breeding, MD  12/04/2019 4:35 PM    [facility]

## 2019-12-04 ENCOUNTER — Encounter: Payer: Self-pay | Admitting: Cardiology

## 2019-12-04 ENCOUNTER — Ambulatory Visit: Payer: Medicare Other | Admitting: Cardiology

## 2019-12-04 ENCOUNTER — Other Ambulatory Visit: Payer: Self-pay

## 2019-12-04 VITALS — BP 118/68 | HR 78 | Ht 68.5 in | Wt 200.0 lb

## 2019-12-04 DIAGNOSIS — Z7189 Other specified counseling: Secondary | ICD-10-CM | POA: Diagnosis not present

## 2019-12-04 DIAGNOSIS — R072 Precordial pain: Secondary | ICD-10-CM

## 2019-12-04 DIAGNOSIS — I341 Nonrheumatic mitral (valve) prolapse: Secondary | ICD-10-CM

## 2019-12-04 NOTE — Patient Instructions (Signed)
Medication Instructions:  NO CHANGES *If you need a refill on your cardiac medications before your next appointment, please call your pharmacy*  Lab Work: NONE ORDERED THIS VISIT  Testing/Procedures: Your physician has requested that you have an exercise tolerance test. For further information please visit [email or web address]. Please also follow instruction sheet, as given.  CORONARY CALCIUM SCORE  Follow-Up: At [facility], you and your health needs are our priority.  As part of our continuing mission to provide you with exceptional heart care, we have created designated Provider Care Teams.  These Care Teams include your primary Cardiologist (physician) and Advanced Practice Providers (APPs -  Physician Assistants and Nurse Practitioners) who all work together to provide you with the care you need, when you need it.   Your next appointment:   FOLLOW UP AS NEEDED  Other Instructions Dr. Percival Spanish has ordered a CT coronary calcium score. This test is done at [address]. This is $150 out of pocket. Coronary CalciumScan A coronary calcium scan is an imaging test used to look for deposits of calcium and other fatty materials (plaques) in the inner lining of the blood vessels of the heart (coronary arteries). These deposits of calcium and plaques can partly clog and narrow the coronary arteries without producing any symptoms or warning signs. This puts a person at risk for a heart attack. This test can detect these deposits before symptoms develop. Tell a health care provider about:  Any allergies you have.  All medicines you are taking, including vitamins, herbs, eye drops, creams, and over-the-counter medicines.  Any problems you or family members have had with anesthetic medicines.  Any blood disorders you have.  Any surgeries you have had.  Any medical conditions you have.  Whether you are pregnant or may be pregnant. What are the risks? Generally, this is  a safe procedure. However, problems may occur, including:  Harm to a pregnant woman and her unborn baby. This test involves the use of radiation. Radiation exposure can be dangerous to a pregnant woman and her unborn baby. If you are pregnant, you generally should not have this procedure done.  Slight increase in the risk of cancer. This is because of the radiation involved in the test. What happens before the procedure? No preparation is needed for this procedure. What happens during the procedure?  You will undress and remove any jewelry around your neck or chest.  You will put on a hospital gown.  Sticky electrodes will be placed on your chest. The electrodes will be connected to an electrocardiogram (ECG) machine to record a tracing of the electrical activity of your heart.  A CT scanner will take pictures of your heart. During this time, you will be asked to lie still and hold your breath for 2-3 seconds while a picture of your heart is being taken. The procedure may vary among health care providers and hospitals. What happens after the procedure?  You can get dressed.  You can return to your normal activities.  It is up to you to get the results of your test. Ask your health care provider, or the department that is doing the test, when your results will be ready. Summary  A coronary calcium scan is an imaging test used to look for deposits of calcium and other fatty materials (plaques) in the inner lining of the blood vessels of the heart (coronary arteries).  Generally, this is a safe procedure. Tell your health care provider if you are  pregnant or may be pregnant.  No preparation is needed for this procedure.  A CT scanner will take pictures of your heart.  You can return to your normal activities after the scan is done. This information is not intended to replace advice given to you by your health care provider. Make sure you discuss any questions you have with your health  care provider. Document Released: 12/11/2007 Document Revised: 05/03/2016 Document Reviewed: 05/03/2016 Elsevier Interactive Patient Education  2017 [employer].

## 2019-12-05 ENCOUNTER — Telehealth: Payer: Self-pay | Admitting: *Deleted

## 2019-12-05 NOTE — Telephone Encounter (Signed)
Left message for patient to call to reschedule  the ETT ordered by Dr. Percival Spanish

## 2019-12-08 ENCOUNTER — Other Ambulatory Visit (HOSPITAL_COMMUNITY): Payer: Medicare Other

## 2019-12-12 ENCOUNTER — Encounter (HOSPITAL_COMMUNITY): Payer: Medicare Other

## 2019-12-17 ENCOUNTER — Telehealth: Payer: Self-pay | Admitting: Cardiology

## 2019-12-17 NOTE — Telephone Encounter (Signed)
   Primary Cardiologist: Minus Breeding, MD  Chart reviewed as part of pre-operative protocol coverage. The patient established care with Dr. Percival Spanish 12/04/19 for chest pain. Felt mixed type. Pending coronary calcium score and POET tomorrow.     [place], [place] 12/17/2019, 2:30 PM

## 2019-12-17 NOTE — Telephone Encounter (Signed)
   [facility] Pre-operative Risk Assessment    HEARTCARE STAFF: - Please ensure there is not already an duplicate clearance open for this procedure. - Under Visit Info/Reason for Call, type in Other and utilize the format Clearance MM/DD/YY or Clearance TBD. Do not use dashes or single digits. - If request is for dental extraction, please clarify the # of teeth to be extracted.  Request for surgical clearance:  1. What type of surgery is being performed? leveral session and blesphasty   2. When is this surgery scheduled? 12/24/19  3. What type of clearance is required (medical clearance vs. Pharmacy clearance to hold med vs. Both)? Medical  4. Are there any medications that need to be held prior to surgery and how long? No   5. Practice name and name of physician performing surgery? [facility]; Dr. Benjamine Mola  6. What is the office phone number? [phone]   7.   What is the office fax number? [phone]  8.   Anesthesia type (None, local, MAC, general) ? Monitored anesthesia   Durel Salts 12/17/2019, 1:38 PM  _________________________________________________________________   (provider comments below)

## 2019-12-18 ENCOUNTER — Ambulatory Visit (INDEPENDENT_AMBULATORY_CARE_PROVIDER_SITE_OTHER): Payer: Medicare Other

## 2019-12-18 ENCOUNTER — Ambulatory Visit (INDEPENDENT_AMBULATORY_CARE_PROVIDER_SITE_OTHER)
Admission: RE | Admit: 2019-12-18 | Discharge: 2019-12-18 | Disposition: A | Discharge status: Home / Self Care | Payer: Self-pay | Source: Ambulatory Visit | Attending: Cardiology | Admitting: Cardiology

## 2019-12-18 ENCOUNTER — Encounter (HOSPITAL_COMMUNITY): Payer: Medicare Other

## 2019-12-18 ENCOUNTER — Telehealth: Payer: Self-pay | Admitting: Cardiology

## 2019-12-18 ENCOUNTER — Other Ambulatory Visit: Payer: Self-pay

## 2019-12-18 DIAGNOSIS — R072 Precordial pain: Secondary | ICD-10-CM

## 2019-12-18 DIAGNOSIS — R918 Other nonspecific abnormal finding of lung field: Secondary | ICD-10-CM | POA: Diagnosis not present

## 2019-12-18 LAB — EXERCISE TOLERANCE TEST
Estimated workload: 10.1 METS
Exercise duration (min): 7 min
Exercise duration (sec): 0 s
MPHR: 159 {beats}/min
Peak HR: 150 {beats}/min
Percent HR: 94 %
RPE: 15
Rest HR: 69 {beats}/min

## 2019-12-18 NOTE — Telephone Encounter (Signed)
Patient called back returning a call from our office °

## 2019-12-18 NOTE — Telephone Encounter (Signed)
Patient states that she is having eye surgery on Monday 12/24/19. She was told by her eye doctor that she would need clearance. I stated that we would need that office to call in so that we can fill out a surgical clearance form. She advised she believed that the office would be doing that but wanted to keep [place] updated.

## 2019-12-18 NOTE — Telephone Encounter (Signed)
Left message to call back  

## 2019-12-18 NOTE — Telephone Encounter (Signed)
Pt advised to have surgeon send surgical clearance form. Pt verbalized understanding.

## 2019-12-19 ENCOUNTER — Telehealth: Payer: Self-pay | Admitting: Cardiology

## 2019-12-19 NOTE — Telephone Encounter (Signed)
Negative exercise treadmill test. Given past medical history and time since last visit, based on ACC/AHA guidelines, Diamond Jentz Laver would be at acceptable risk for the planned procedure without further cardiovascular testing.   I will route this recommendation to the requesting party via Epic fax function and remove from pre-op pool.  Please call with questions.  [place], [place] 12/19/2019, 1:32 PM

## 2019-12-19 NOTE — Telephone Encounter (Signed)
Follow Up:   Sydney from Dr Rice's office called. She is checking on the status of pt's clearance. Please fax this asap to  [phone].

## 2019-12-19 NOTE — Telephone Encounter (Signed)
Per our pre op team to verify if Dr. Marveen Reeks office has received clearance fax. Clearance has yet to be received. While I was on the phone with Dr. Marveen Reeks office I did re-fax to number on clearance as well as their main fax number. I advised to please let know if they still have not received the fax by tomorrow or Friday.

## 2019-12-24 DIAGNOSIS — H5340 Unspecified visual field defects: Secondary | ICD-10-CM | POA: Diagnosis not present

## 2019-12-24 DIAGNOSIS — H02403 Unspecified ptosis of bilateral eyelids: Secondary | ICD-10-CM | POA: Diagnosis not present

## 2019-12-24 DIAGNOSIS — H02834 Dermatochalasis of left upper eyelid: Secondary | ICD-10-CM | POA: Diagnosis not present

## 2019-12-24 DIAGNOSIS — H02831 Dermatochalasis of right upper eyelid: Secondary | ICD-10-CM | POA: Diagnosis not present

## 2019-12-24 DIAGNOSIS — R079 Chest pain, unspecified: Secondary | ICD-10-CM | POA: Diagnosis not present

## 2019-12-27 ENCOUNTER — Other Ambulatory Visit: Payer: Self-pay | Admitting: Cardiology

## 2019-12-27 ENCOUNTER — Telehealth: Payer: Self-pay | Admitting: Cardiology

## 2019-12-27 DIAGNOSIS — R072 Precordial pain: Secondary | ICD-10-CM

## 2019-12-27 DIAGNOSIS — Z5181 Encounter for therapeutic drug level monitoring: Secondary | ICD-10-CM

## 2019-12-27 DIAGNOSIS — R931 Abnormal findings on diagnostic imaging of heart and coronary circulation: Secondary | ICD-10-CM

## 2019-12-27 MED ORDER — ATORVASTATIN CALCIUM 40 MG PO TABS: 40.0000 mg | ORAL_TABLET | Freq: Every day | ORAL | 1 refills | Status: DC

## 2019-12-27 NOTE — Telephone Encounter (Signed)
Yes

## 2019-12-27 NOTE — Telephone Encounter (Signed)
*  STAT* If patient is at the pharmacy, call can be transferred to refill team.   1. Which medications need to be refilled? (please list name of each medication and dose if known)   Lipitor   2. Which pharmacy/location (including street and city if local pharmacy) is medication to be sent to? [facility] ([place] [place] [address]  3. Do they need a 30 day or 90 day supply? 9   Pt said Dr. Percival Spanish would be calling in a medication to her pharmacy for her but she is not sure what the exact name or  dosage of the medication was. She believes it was Lipitor but is not certain. She called her pharmacy today and there was nothing ready for her

## 2019-12-27 NOTE — Telephone Encounter (Signed)
Follow up    *STAT* If patient is at the pharmacy, call can be transferred to refill team.   1. Which medications need to be refilled? (please list name of each medication and dose if known) atorvastatin generic of lipitor   2. Which pharmacy/location (including street and city if local pharmacy) is medication to be sent to?[facility]  fax# [phone]  3. Do they need a 30 day or 90 day supply? 90 days  Pt said she spoke with her insurance and was told they will cover the generic of lipitor through [facility]. She also said if she can get 30 days emergency refill send to [facility] ([place] [place] while waiting her 90 days supply in the mail

## 2019-12-27 NOTE — Telephone Encounter (Signed)
rx sent to pharmacy as requested

## 2019-12-27 NOTE — Telephone Encounter (Signed)
Agree with this prescription per my result note of her coronary calcium score.  She wants to switch her care to Dr. Claiborne Billings and I am fine with this.

## 2019-12-27 NOTE — Addendum Note (Signed)
Addended by: Earvin Hansen on: 12/27/2019 06:53 PM   Modules accepted: Orders

## 2019-12-27 NOTE — Telephone Encounter (Signed)
Patient would like to switch from Dr. Percival Spanish to Dr. Claiborne Billings. Are you both in agreement?

## 2019-12-28 NOTE — Telephone Encounter (Signed)
ok 

## 2020-01-01 ENCOUNTER — Other Ambulatory Visit: Payer: Self-pay

## 2020-01-01 DIAGNOSIS — R911 Solitary pulmonary nodule: Secondary | ICD-10-CM

## 2020-01-07 ENCOUNTER — Other Ambulatory Visit: Payer: Self-pay

## 2020-01-07 ENCOUNTER — Encounter: Payer: Self-pay | Admitting: Cardiovascular Disease

## 2020-01-07 ENCOUNTER — Ambulatory Visit: Payer: Medicare Other | Admitting: Cardiovascular Disease

## 2020-01-07 VITALS — BP 115/68 | HR 66 | Temp 94.3°F | Ht 70.0 in | Wt 199.4 lb

## 2020-01-07 DIAGNOSIS — I2510 Atherosclerotic heart disease of native coronary artery without angina pectoris: Secondary | ICD-10-CM | POA: Diagnosis not present

## 2020-01-07 DIAGNOSIS — I341 Nonrheumatic mitral (valve) prolapse: Secondary | ICD-10-CM | POA: Diagnosis not present

## 2020-01-07 DIAGNOSIS — E782 Mixed hyperlipidemia: Secondary | ICD-10-CM | POA: Diagnosis not present

## 2020-01-07 DIAGNOSIS — R0789 Other chest pain: Secondary | ICD-10-CM

## 2020-01-07 DIAGNOSIS — R931 Abnormal findings on diagnostic imaging of heart and coronary circulation: Secondary | ICD-10-CM

## 2020-01-07 DIAGNOSIS — R7303 Prediabetes: Secondary | ICD-10-CM

## 2020-01-07 DIAGNOSIS — I2584 Coronary atherosclerosis due to calcified coronary lesion: Secondary | ICD-10-CM

## 2020-01-07 NOTE — Patient Instructions (Signed)
Medication Instructions:  CONTINUE WITH CURRENT MEDICATIONS. NO CHANGES.  *If you need a refill on your cardiac medications before your next appointment, please call your pharmacy*  Follow-Up: At [facility], you and your health needs are our priority.  As part of our continuing mission to provide you with exceptional heart care, we have created designated Provider Care Teams.  These Care Teams include your primary Cardiologist (physician) and Advanced Practice Providers (APPs -  Physician Assistants and Nurse Practitioners) who all work together to provide you with the care you need, when you need it.  We recommend signing up for the patient portal called "MyChart".  Sign up information is provided on this After Visit Summary.  MyChart is used to connect with patients for Virtual Visits (Telemedicine).  Patients are able to view lab/test results, encounter notes, upcoming appointments, etc.  Non-urgent messages can be sent to your provider as well.   To learn more about what you can do with MyChart, go to [email or web address].    Your next appointment:   3-4 month(s)  The format for your next appointment:   In Person  Provider:   Shelva Majestic, MD

## 2020-01-07 NOTE — Progress Notes (Signed)
Cardiology Office Note    Date:  01/14/2020   ID:  Dominique Lane, DOB 17-Oct-1960, MRN [id number]  PCP:  Hoyt Koch, MD  Cardiologist:  Shelva Majestic, MD   Reestablishment of care with me. I had seen the patient approximately 12 years ago. She apparently had recently seen Dr. Percival Spanish in June 2021 and wished to reestablish care back with me.  History of Present Illness:  Dominique Lane is a 59 y.o. female who was recently evaluated by Dr. Percival Spanish in June 2021 for evaluation of chest pain referred through Dr. Pricilla Holm. The patient's chest pain was dull coming and going over several days duration and often would occur at rest. It was unassociated with any nausea vomiting or diaphoresis. She denied any exertional precipitation. She underwent an size treadmill test test on December 18, 2019 where she exercised to a 10.1-minute workload, and a normal blood pressure response to exercise and did not develop any chest pain or ECG abnormalities. She also had undergone laboratory on February 01, 2019 which had shown a total cholesterol of 273 with an LDL cholesterol of 196. On December 20, 2019 a calcium score was 76 agonist on units, 85th percentile for age and sex max control. Mild calcification was noted in the proximal LAD.  Presently, Dominique Lane denies any recent exertional chest tightness or pressure. She was recently started on atorvastatin statin 40 mg. Purposefully trying to lose weight and admits to a 15 pound weight loss since January 2021. She presents for evaluation.   Past Medical History:  Diagnosis Date  . Hyperlipidemia   . MVP (mitral valve prolapse)   . Osteopenia 12/2018   T score of -1.9 FRAX 8.8% / 1.0%    Past Surgical History:  Procedure Laterality Date  . CARPAL TUNNEL RELEASE Bilateral   . CHOLECYSTECTOMY    . COLONOSCOPY  2011   Dr Amedeo Plenty  . fractured mandible  1997   repair  . GANGLION CYST EXCISION    . LAPAROSCOPIC LYSIS OF ADHESIONS N/A  09/13/2012   Procedure: LAPAROSCOPIC LYSIS OF ADHESIONS;  Surgeon: Terrance Mass, MD;  Location: [facility];  Service: Gynecology;  Laterality: N/A;  . LAPAROSCOPY N/A 09/13/2012   Procedure: LAPAROSCOPY OPERATIVE;  Surgeon: Terrance Mass, MD;  Location: [facility];  Service: Gynecology;  Laterality: N/A;  . OVARIAN CYST REMOVAL     Right  . ROTATOR CUFF REPAIR     twice on left and once on right  . SALPINGOOPHORECTOMY Bilateral 09/13/2012   Procedure: SALPINGO OOPHORECTOMY;  Surgeon: Terrance Mass, MD;  Location: [facility];  Service: Gynecology;  Laterality: Bilateral;  . TUBAL LIGATION    . VAGINAL HYSTERECTOMY      Current Medications: Outpatient Medications Prior to Visit  Medication Sig Dispense Refill  . aspirin EC 81 MG tablet Take 81 mg by mouth daily. Swallow whole.    Marland Kitchen atorvastatin (LIPITOR) 40 MG tablet Take 1 tablet (40 mg total) by mouth daily. 90 tablet 1  . calcium gluconate 500 MG tablet Take 1 tablet by mouth daily.    . Multiple Vitamin (MULTI-VITAMIN) tablet Take by mouth daily.    . pantoprazole (PROTONIX) 40 MG tablet Take 40 mg by mouth as needed.     No facility-administered medications prior to visit.     Allergies:   Cephalosporins, Naproxen sodium, and Hydromet [hydrocodone-homatropine]   Social History   Socioeconomic History  . Marital status: Divorced    Spouse name: Not on  file  . Number of children: 2  . Years of education: Not on file  . Highest education level: Not on file  Occupational History  . Occupation: [employer], production line  Tobacco Use  . Smoking status: Never Smoker  . Smokeless tobacco: Never Used  Vaping Use  . Vaping Use: Never used  Substance and Sexual Activity  . Alcohol use: Yes    Alcohol/week: 0.0 standard drinks    Comment: occasionally; < 2 glasseswine / month  . Drug use: No  . Sexual activity: Not Currently    Birth control/protection: Surgical    Comment: 1st intercourse 13 yo-5 partners  Other Topics Concern   . Not on file  Social History Narrative   Lives alone.     Social Determinants of Health   Financial Resource Strain:   . Difficulty of Paying Living Expenses:   Food Insecurity:   . Worried About Charity fundraiser in the Last Year:   . Arboriculturist in the Last Year:   Transportation Needs:   . Film/video editor (Medical):   Marland Kitchen Lack of Transportation (Non-Medical):   Physical Activity:   . Days of Exercise per Week:   . Minutes of Exercise per Session:   Stress:   . Feeling of Stress :   Social Connections:   . Frequency of Communication with Friends and Family:   . Frequency of Social Gatherings with Friends and Family:   . Attends Religious Services:   . Active Member of Clubs or Organizations:   . Attends Archivist Meetings:   Marland Kitchen Marital Status:      Family History:  The patient's family history includes Bladder Cancer (age of onset: 39) in her father; Heart disease (age of onset: 22) in her mother; Hypertension in her sister; Leukemia in her paternal grandfather; Lymphoma in her paternal aunt; Ovarian cancer (age of onset: 29) in her sister.   ROS General: Negative; No fevers, chills, or night sweats;  HEENT: Negative; No changes in vision or hearing, sinus congestion, difficulty swallowing Pulmonary: Negative; No cough, wheezing, shortness of breath, hemoptysis Cardiovascular: Negative; No chest pain, presyncope, syncope, palpitations GI: Negative; No nausea, vomiting, diarrhea, or abdominal pain GU: Negative; No dysuria, hematuria, or difficulty voiding Musculoskeletal: Negative; no myalgias, joint pain, or weakness Hematologic/Oncology: Negative; no easy bruising, bleeding Endocrine: Negative; no heat/cold intolerance; no diabetes Neuro: Negative; no changes in balance, headaches Skin: Negative; No rashes or skin lesions Psychiatric: Negative; No behavioral problems, depression Sleep: Negative; No snoring, daytime sleepiness, hypersomnolence,  bruxism, restless legs, hypnogognic hallucinations, no cataplexy Other comprehensive 14 point system review is negative.   PHYSICAL EXAM:   VS:  BP 115/68   Pulse 66   Temp (!) 94.3 F (34.6 C)   Ht _0  (1.778 m)   Wt 199 lb 6.4 oz (90.4 kg)   SpO2 94%   BMI 28.61 kg/m     Repeat blood pressure by me was 122/70  Wt Readings from Last 3 Encounters:  01/07/20 199 lb 6.4 oz (90.4 kg)  12/04/19 200 lb (90.7 kg)  11/27/19 200 lb (90.7 kg)    General: Alert, oriented, no distress.  Skin: normal turgor, no rashes, warm and dry HEENT: Normocephalic, atraumatic. Pupils equal round and reactive to light; sclera anicteric; extraocular muscles intact; Nose without nasal septal hypertrophy Mouth/Parynx benign; Mallinpatti scale 3 Neck: No JVD, no carotid bruits; normal carotid upstroke Lungs: clear to ausculatation and percussion; no wheezing or rales Chest  wall: without tenderness to palpitation Heart: PMI not displaced, RRR, s1 s2 normal, 1/6 systolic murmur, no diastolic murmur, no rubs, gallops, thrills, or heaves Abdomen: soft, nontender; no hepatosplenomehaly, BS+; abdominal aorta nontender and not dilated by palpation. Back: no CVA tenderness Pulses 2+ Musculoskeletal: full range of motion, normal strength, no joint deformities Extremities: no clubbing cyanosis or edema, Homan's sign negative  Neurologic: grossly nonfocal; Cranial nerves grossly wnl Psychologic: Normal mood and affect   Studies/Labs Reviewed:   EKG:  EKG is ordered today.  ECG (independently read by me): NSR at 66; no ectopy; normal intervals  Recent Labs: BMP Latest Ref Rng & Units 02/01/2019 08/08/2017 03/02/2016  Glucose 70 - 99 mg/dL 88 91 91  BUN 6 - 23 mg/dL _0 Creatinine 0.40 - 1.20 mg/dL 0.82 0.77 0.87  Sodium 135 - 145 mEq/L 138 140 136  Potassium 3.5 - 5.1 mEq/L 4.0 4.2 4.4  Chloride 96 - 112 mEq/L 105 106 103  CO2 19 - 32 mEq/L _1 Calcium 8.4 - 10.5 mg/dL 9.4 9.1 9.2      Hepatic Function Latest Ref Rng & Units 02/01/2019 08/08/2017 03/02/2016  Total Protein 6.0 - 8.3 g/dL 7.4 7.0 7.3  Albumin 3.5 - 5.2 g/dL 4.5 4.1 4.4  AST 0 - 37 U/L _2 ALT 0 - 35 U/L _3 Alk Phosphatase 39 - 117 U/L 106 85 98  Total Bilirubin 0.2 - 1.2 mg/dL 0.5 0.5 0.6  Bilirubin, Direct 0.0 - 0.3 mg/dL - - -    CBC Latest Ref Rng & Units 02/01/2019 08/08/2017 03/02/2016  WBC 4.0 - 10.5 K/uL 6.1 5.2 5.6  Hemoglobin 12.0 - 15.0 g/dL 13.4 12.8 13.5  Hematocrit 36 - 46 % 40.3 37.8 40.0  Platelets 150 - 400 K/uL 233.0 243 248.0   Lab Results  Component Value Date   MCV 88.9 02/01/2019   MCV 86.5 08/08/2017   MCV 89.0 03/02/2016   Lab Results  Component Value Date   TSH 1.71 01/28/2014   Lab Results  Component Value Date   HGBA1C 6.1 02/01/2019     BNP No results found for: BNP  ProBNP    Component Value Date/Time   PROBNP 25.0 09/26/2008 1448     Lipid Panel     Component Value Date/Time   CHOL 273 (H) 02/01/2019 1111   TRIG 159.0 (H) 02/01/2019 1111   HDL 44.80 02/01/2019 1111   CHOLHDL 6 02/01/2019 1111   VLDL 31.8 02/01/2019 1111   LDLCALC 196 (H) 02/01/2019 1111   LDLDIRECT 170.0 08/08/2017 1033     RADIOLOGY: CT CARDIAC SCORING  Addendum Date: 12/20/2019   ADDENDUM REPORT: 12/20/2019 07:12 CLINICAL DATA:  Risk stratification EXAM: Coronary Calcium Score TECHNIQUE: The patient was scanned on a Siemens Sensation 16 slice scanner. Axial non-contrast 16m slices were carried out through the heart. The data set was analyzed on a dedicated work station and scored using the [place] FINDINGS: Non-cardiac: [facility]. Ascending Aorta: Normal caliber Pericardium: Normal Coronaries: Calcium noted in proximal LAD. IMPRESSION: Coronary calcium score of 76 Agatston units. This was 85th percentile for age and sex matched control, suggesting high risk for future cardiac events. Dalton Mclean Electronically Signed   By:  DLoralie ChampagneM.D.   On: 12/20/2019 07:12   Result Date: 12/20/2019 EXAM: OVER-READ INTERPRETATION  CT CHEST The following report is an over-read performed by radiologist Dr. KAbigail Miyamotoof [facility] on 12/18/2019. This over-read does not include interpretation of cardiac or coronary anatomy or pathology. The calcium score interpretation by the cardiologist is attached. COMPARISON:  Chest radiograph 03/22/2019. FINDINGS: Vascular: Aortic atherosclerosis. Mediastinum/Nodes: No imaged thoracic adenopathy. Lungs/Pleura: No pleural fluid. 3 mm right lower lobe pulmonary nodule on 30/3. A subpleural right lower lobe 4 mm nodule on 03/03. Upper Abdomen: Normal imaged portions of the liver, spleen, stomach. Musculoskeletal: No acute osseous abnormality. IMPRESSION: 1.  No acute findings in the imaged extracardiac chest. 2.  Aortic Atherosclerosis (ICD10-I70.0). 3. Right lower lobe pulmonary nodules of maximally 4 mm. No follow-up needed if patient is low-risk. Non-contrast chest CT can be considered in 12 months if patient is high-risk. This recommendation follows the consensus statement: Guidelines for Management of Incidental Pulmonary Nodules Detected on CT Images: From the Fleischner Society 2017; Radiology 2017; 284:228-243. Electronically Signed: By: Abigail Miyamoto M.D. On: 12/18/2019 10:05   EXERCISE TOLERANCE TEST (ETT)  Result Date: 12/18/2019  Good exercise capacity, achieved 10.1 METS  Blood pressure demonstrated a normal response to exercise.  There was no ST segment deviation noted during stress.  Motion artifact during study, but no evidence of ischemia seen      Additional studies/ records that were reviewed today include:  As above.  ASSESSMENT:    1. HYPERLIPIDEMIA   2. Atypical chest pain   3. MVP (mitral valve prolapse)   4. Coronary artery calcification   5. Agatston coronary artery calcium score less than 100   6. Prediabetes     PLAN:  Dominique Lane is a  59 year old female who recently developed chest pain with some atypical features. Her exercise treadmill test was negative for ischemia and she was able to achieve a 10.1 met workload. She has a history of significant hyperlipidemia raising concern for possible familial hyperlipidemia particularly with LDL at 196 in August 2020. She was found to have a cardiac calcium score of 97 representing 85th percentile for age and sex matched control. Calcium was noted in the proximal LAD. Presently, she is not having ischemic sounding chest pain. She is now on atorvastatin 40 mg and I have recommended target LDL less than 70 in attempt to induce plaque regression and stability. I have recommended follow-up laboratory be obtained in approximately 2 to 3 months and at that time medication adjustment will be made if necessary. I discussed the importance of continued exercise. I commended her on her weight loss. She has prediabetes by laboratory in August 2020 with a hemoglobin A1c of 6.1. Hopefully with her weight loss and awareness of carbohydrate and sugary food reduction that this should improve. Remotely, she states he was evaluated for mitral valve prolapse. I will see her in 3 to 4 months for follow-up evaluation.  Medication Adjustments/Labs and Tests Ordered: Current medicines are reviewed at length with the patient today.  Concerns regarding medicines are outlined above.  Medication changes, Labs and Tests ordered today are listed in the Patient Instructions below. Patient Instructions  Medication Instructions:  CONTINUE WITH CURRENT MEDICATIONS. NO CHANGES.  *If you need a refill on your cardiac medications before your next appointment, please call your pharmacy*  Follow-Up: At [facility], you and your health needs are our priority.  As part of our continuing mission to provide you with exceptional heart care, we have created designated Provider Care Teams.  These Care Teams include your primary  Cardiologist (physician) and Advanced Practice Providers (APPs -  Physician Assistants and Nurse Practitioners) who  all work together to provide you with the care you need, when you need it.  We recommend signing up for the patient portal called "MyChart".  Sign up information is provided on this After Visit Summary.  MyChart is used to connect with patients for Virtual Visits (Telemedicine).  Patients are able to view lab/test results, encounter notes, upcoming appointments, etc.  Non-urgent messages can be sent to your provider as well.   To learn more about what you can do with MyChart, go to [email or web address].    Your next appointment:   3-4 month(s)  The format for your next appointment:   In Person  Provider:   Shelva Majestic, MD        Signed, Shelva Majestic, MD  01/14/2020 7:24 PM    [facility] [address], [place], [place], [place]  [address] Phone: [phone]

## 2020-01-14 ENCOUNTER — Encounter: Payer: Self-pay | Admitting: Cardiovascular Disease

## 2020-01-21 DIAGNOSIS — D225 Melanocytic nevi of trunk: Secondary | ICD-10-CM | POA: Diagnosis not present

## 2020-01-21 DIAGNOSIS — L821 Other seborrheic keratosis: Secondary | ICD-10-CM | POA: Diagnosis not present

## 2020-01-21 DIAGNOSIS — D2371 Other benign neoplasm of skin of right lower limb, including hip: Secondary | ICD-10-CM | POA: Diagnosis not present

## 2020-02-04 ENCOUNTER — Encounter: Payer: Medicare Other | Admitting: Internal Medicine

## 2020-02-05 ENCOUNTER — Encounter: Payer: Medicare Other | Admitting: Internal Medicine

## 2020-02-05 ENCOUNTER — Ambulatory Visit: Payer: Medicare Other

## 2020-02-06 ENCOUNTER — Encounter: Payer: Self-pay | Admitting: Internal Medicine

## 2020-02-06 ENCOUNTER — Other Ambulatory Visit: Payer: Self-pay

## 2020-02-06 ENCOUNTER — Ambulatory Visit
Admission: RE | Admit: 2020-02-06 | Discharge: 2020-02-06 | Disposition: A | Discharge status: Home / Self Care | Payer: Medicare Other | Source: Ambulatory Visit | Attending: Obstetrics and Gynecology | Admitting: Obstetrics and Gynecology

## 2020-02-06 ENCOUNTER — Ambulatory Visit (INDEPENDENT_AMBULATORY_CARE_PROVIDER_SITE_OTHER): Payer: Medicare Other | Admitting: Internal Medicine

## 2020-02-06 VITALS — BP 108/72 | HR 70 | Temp 98.7°F | Ht 70.0 in | Wt 195.0 lb

## 2020-02-06 DIAGNOSIS — K219 Gastro-esophageal reflux disease without esophagitis: Secondary | ICD-10-CM | POA: Diagnosis not present

## 2020-02-06 DIAGNOSIS — Z0000 Encounter for general adult medical examination without abnormal findings: Secondary | ICD-10-CM | POA: Diagnosis not present

## 2020-02-06 DIAGNOSIS — Z1231 Encounter for screening mammogram for malignant neoplasm of breast: Secondary | ICD-10-CM

## 2020-02-06 DIAGNOSIS — R7303 Prediabetes: Secondary | ICD-10-CM | POA: Diagnosis not present

## 2020-02-06 DIAGNOSIS — E782 Mixed hyperlipidemia: Secondary | ICD-10-CM

## 2020-02-06 MED ORDER — PANTOPRAZOLE SODIUM 40 MG PO TBEC: 40.0000 mg | DELAYED_RELEASE_TABLET | ORAL | 3 refills | Status: DC | PRN

## 2020-02-06 NOTE — Assessment & Plan Note (Signed)
Taking pantoprazole every other day and symptoms well controlled.

## 2020-02-06 NOTE — Progress Notes (Signed)
   Subjective:   Patient ID: Dominique Lane, female    DOB: 1960/10/17, 59 y.o.   MRN: [id number]  HPI The patient is a 59 YO female coming in for physical.   PMH, [place], social history reviewed and updated  Review of Systems  Constitutional: Negative.   HENT: Negative.   Eyes: Negative.   Respiratory: Negative for cough, chest tightness and shortness of breath.   Cardiovascular: Negative for chest pain, palpitations and leg swelling.  Gastrointestinal: Negative for abdominal distention, abdominal pain, constipation, diarrhea, nausea and vomiting.  Musculoskeletal: Negative.   Skin: Negative.   Neurological: Negative.   Psychiatric/Behavioral: Negative.     Objective:  Physical Exam Constitutional:      Appearance: She is well-developed.  HENT:     Head: Normocephalic and atraumatic.  Cardiovascular:     Rate and Rhythm: Normal rate and regular rhythm.  Pulmonary:     Effort: Pulmonary effort is normal. No respiratory distress.     Breath sounds: Normal breath sounds. No wheezing or rales.  Abdominal:     General: Bowel sounds are normal. There is no distension.     Palpations: Abdomen is soft.     Tenderness: There is no abdominal tenderness. There is no rebound.  Musculoskeletal:     Cervical back: Normal range of motion.  Skin:    General: Skin is warm and dry.  Neurological:     Mental Status: She is alert and oriented to person, place, and time.     Coordination: Coordination normal.    Vitals:   02/06/20 0951  BP: 108/72  Pulse: 70  Temp: 98.7 F (37.1 C)  TempSrc: Oral  SpO2: 95%  Weight: 195 lb (88.5 kg)  Height: 5\' 10"  (1.778 m)   This visit occurred during the SARS-CoV-2 public health emergency.  Safety protocols were in place, including screening questions prior to the visit, additional usage of staff PPE, and extensive cleaning of exam room while observing appropriate contact time as indicated for disinfecting solutions.   Assessment & Plan:

## 2020-02-06 NOTE — Assessment & Plan Note (Signed)
Flu shot yearly. Covid-19 up to date. Shingrix counseled declines. Tetanus up to date. Colonoscopy due later this year. Mammogram getting later today, pap smear up to date. Counseled about sun safety and mole surveillance. Counseled about the dangers of distracted driving. Given 10 year screening recommendations.

## 2020-02-06 NOTE — Assessment & Plan Note (Signed)
Checking Hga1c and adjust as needed.  

## 2020-02-06 NOTE — Patient Instructions (Signed)
Health Maintenance, Female Adopting a healthy lifestyle and getting preventive care are important in promoting health and wellness. Ask your health care provider about:  The right schedule for you to have regular tests and exams.  Things you can do on your own to prevent diseases and keep yourself healthy. What should I know about diet, weight, and exercise? Eat a healthy diet   Eat a diet that includes plenty of vegetables, fruits, low-fat dairy products, and lean protein.  Do not eat a lot of foods that are high in solid fats, added sugars, or sodium. Maintain a healthy weight Body mass index (BMI) is used to identify weight problems. It estimates body fat based on height and weight. Your health care provider can help determine your BMI and help you achieve or maintain a healthy weight. Get regular exercise Get regular exercise. This is one of the most important things you can do for your health. Most adults should:  Exercise for at least 150 minutes each week. The exercise should increase your heart rate and make you sweat (moderate-intensity exercise).  Do strengthening exercises at least twice a week. This is in addition to the moderate-intensity exercise.  Spend less time sitting. Even light physical activity can be beneficial. Watch cholesterol and blood lipids Have your blood tested for lipids and cholesterol at 61 years of age, then have this test every 5 years. Have your cholesterol levels checked more often if:  Your lipid or cholesterol levels are high.  You are older than 61 years of age.  You are at high risk for heart disease. What should I know about cancer screening? Depending on your health history and family history, you may need to have cancer screening at various ages. This may include screening for:  Breast cancer.  Cervical cancer.  Colorectal cancer.  Skin cancer.  Lung cancer. What should I know about heart disease, diabetes, and high blood  pressure? Blood pressure and heart disease  High blood pressure causes heart disease and increases the risk of stroke. This is more likely to develop in people who have high blood pressure readings, are of African descent, or are overweight.  Have your blood pressure checked: ? Every 3-5 years if you are 18-39 years of age. ? Every year if you are 40 years old or older. Diabetes Have regular diabetes screenings. This checks your fasting blood sugar level. Have the screening done:  Once every three years after age 40 if you are at a normal weight and have a low risk for diabetes.  More often and at a younger age if you are overweight or have a high risk for diabetes. What should I know about preventing infection? Hepatitis B If you have a higher risk for hepatitis B, you should be screened for this virus. Talk with your health care provider to find out if you are at risk for hepatitis B infection. Hepatitis C Testing is recommended for:  Everyone born from 1945 through 1965.  Anyone with known risk factors for hepatitis C. Sexually transmitted infections (STIs)  Get screened for STIs, including gonorrhea and chlamydia, if: ? You are sexually active and are younger than 61 years of age. ? You are older than 61 years of age and your health care provider tells you that you are at risk for this type of infection. ? Your sexual activity has changed since you were last screened, and you are at increased risk for chlamydia or gonorrhea. Ask your health care provider if   you are at risk.  Ask your health care provider about whether you are at high risk for HIV. Your health care provider may recommend a prescription medicine to help prevent HIV infection. If you choose to take medicine to prevent HIV, you should first get tested for HIV. You should then be tested every 3 months for as long as you are taking the medicine. Pregnancy  If you are about to stop having your period (premenopausal) and  you may become pregnant, seek counseling before you get pregnant.  Take 400 to 800 micrograms (mcg) of folic acid every day if you become pregnant.  Ask for birth control (contraception) if you want to prevent pregnancy. Osteoporosis and menopause Osteoporosis is a disease in which the bones lose minerals and strength with aging. This can result in bone fractures. If you are 65 years old or older, or if you are at risk for osteoporosis and fractures, ask your health care provider if you should:  Be screened for bone loss.  Take a calcium or vitamin D supplement to lower your risk of fractures.  Be given hormone replacement therapy (HRT) to treat symptoms of menopause. Follow these instructions at home: Lifestyle  Do not use any products that contain nicotine or tobacco, such as cigarettes, e-cigarettes, and chewing tobacco. If you need help quitting, ask your health care provider.  Do not use street drugs.  Do not share needles.  Ask your health care provider for help if you need support or information about quitting drugs. Alcohol use  Do not drink alcohol if: ? Your health care provider tells you not to drink. ? You are pregnant, may be pregnant, or are planning to become pregnant.  If you drink alcohol: ? Limit how much you use to 0-1 drink a day. ? Limit intake if you are breastfeeding.  Be aware of how much alcohol is in your drink. In the [place] one drink equals one 12 oz bottle of beer (355 mL), one 5 oz glass of wine (148 mL), or one 1 oz glass of hard liquor (44 mL). General instructions  Schedule regular health, dental, and eye exams.  Stay current with your vaccines.  Tell your health care provider if: ? You often feel depressed. ? You have ever been abused or do not feel safe at home. Summary  Adopting a healthy lifestyle and getting preventive care are important in promoting health and wellness.  Follow your health care provider's instructions about healthy  diet, exercising, and getting tested or screened for diseases.  Follow your health care provider's instructions on monitoring your cholesterol and blood pressure. This information is not intended to replace advice given to you by your health care provider. Make sure you discuss any questions you have with your health care provider. Document Revised: 06/07/2018 Document Reviewed: 06/07/2018 Elsevier Patient Education  2020 Elsevier Inc.  

## 2020-02-06 NOTE — Assessment & Plan Note (Signed)
Taking lipitor 40 mg daily for last 2 months and doing well. Checking lipid panel and adjust as needed.

## 2020-02-07 LAB — LIPID PANEL
Cholesterol: 140 mg/dL (ref <200)
HDL: 44 mg/dL — ABNORMAL LOW (ref >=50)
LDL Cholesterol (Calc): 79 mg/dL (calc)
Non-HDL Cholesterol (Calc): 96 mg/dL (calc) (ref <130)
Total CHOL/HDL Ratio: 3.2 (calc) (ref <5.0)
Triglycerides: 85 mg/dL (ref <150)

## 2020-02-07 LAB — COMPREHENSIVE METABOLIC PANEL
AG Ratio: 1.9 (calc) (ref 1.0–2.5)
ALT: 18 U/L (ref 6–29)
AST: 19 U/L (ref 10–35)
Albumin: 4.5 g/dL (ref 3.6–5.1)
Alkaline phosphatase (APISO): 100 U/L (ref 37–153)
BUN: 18 mg/dL (ref 7–25)
CO2: 25 mmol/L (ref 20–32)
Calcium: 9.4 mg/dL (ref 8.6–10.4)
Chloride: 106 mmol/L (ref 98–110)
Creat: 0.82 mg/dL (ref 0.50–0.99)
Globulin: 2.4 g/dL (calc) (ref 1.9–3.7)
Glucose, Bld: 92 mg/dL (ref 65–99)
Potassium: 4.3 mmol/L (ref 3.5–5.3)
Sodium: 139 mmol/L (ref 135–146)
Total Bilirubin: 0.5 mg/dL (ref 0.2–1.2)
Total Protein: 6.9 g/dL (ref 6.1–8.1)

## 2020-02-07 LAB — HEMOGLOBIN A1C
Hgb A1c MFr Bld: 5.8 % of total Hgb — ABNORMAL HIGH (ref <5.7)
Mean Plasma Glucose: 120 (calc)
eAG (mmol/L): 6.6 (calc)

## 2020-02-07 LAB — CBC
HCT: 38.9 % (ref 35.0–45.0)
Hemoglobin: 13.1 g/dL (ref 11.7–15.5)
MCH: 30 pg (ref 27.0–33.0)
MCHC: 33.7 g/dL (ref 32.0–36.0)
MCV: 89.2 fL (ref 80.0–100.0)
MPV: 10 fL (ref 7.5–12.5)
Platelets: 225 10*3/uL (ref 140–400)
RBC: 4.36 10*6/uL (ref 3.80–5.10)
RDW: 12.6 % (ref 11.0–15.0)
WBC: 5.9 10*3/uL (ref 3.8–10.8)

## 2020-02-08 ENCOUNTER — Other Ambulatory Visit: Payer: Self-pay | Admitting: Obstetrics and Gynecology

## 2020-02-08 DIAGNOSIS — R928 Other abnormal and inconclusive findings on diagnostic imaging of breast: Secondary | ICD-10-CM

## 2020-02-12 ENCOUNTER — Encounter: Payer: Self-pay | Admitting: Internal Medicine

## 2020-02-13 MED ORDER — PANTOPRAZOLE SODIUM 40 MG PO TBEC: 40.0000 mg | DELAYED_RELEASE_TABLET | Freq: Every day | ORAL | 3 refills | Status: DC

## 2020-02-22 ENCOUNTER — Other Ambulatory Visit: Payer: Self-pay

## 2020-02-22 ENCOUNTER — Ambulatory Visit
Admission: RE | Admit: 2020-02-22 | Discharge: 2020-02-22 | Disposition: A | Discharge status: Home / Self Care | Payer: Medicare Other | Source: Ambulatory Visit | Attending: Obstetrics and Gynecology | Admitting: Obstetrics and Gynecology

## 2020-02-22 ENCOUNTER — Other Ambulatory Visit: Payer: Self-pay | Admitting: Obstetrics and Gynecology

## 2020-02-22 DIAGNOSIS — R928 Other abnormal and inconclusive findings on diagnostic imaging of breast: Secondary | ICD-10-CM

## 2020-02-22 DIAGNOSIS — N6312 Unspecified lump in the right breast, upper inner quadrant: Secondary | ICD-10-CM | POA: Diagnosis not present

## 2020-02-28 DIAGNOSIS — Z48810 Encounter for surgical aftercare following surgery on the sense organs: Secondary | ICD-10-CM | POA: Diagnosis not present

## 2020-03-05 ENCOUNTER — Ambulatory Visit
Admission: RE | Admit: 2020-03-05 | Discharge: 2020-03-05 | Disposition: A | Discharge status: Home / Self Care | Payer: Medicare Other | Source: Ambulatory Visit | Attending: Obstetrics and Gynecology | Admitting: Obstetrics and Gynecology

## 2020-03-05 ENCOUNTER — Other Ambulatory Visit: Payer: Self-pay

## 2020-03-05 DIAGNOSIS — R928 Other abnormal and inconclusive findings on diagnostic imaging of breast: Secondary | ICD-10-CM

## 2020-03-05 DIAGNOSIS — N6312 Unspecified lump in the right breast, upper inner quadrant: Secondary | ICD-10-CM | POA: Diagnosis not present

## 2020-03-05 HISTORY — PX: BREAST BIOPSY: SHX20

## 2020-03-25 ENCOUNTER — Ambulatory Visit: Payer: Medicare Other | Admitting: Cardiology

## 2020-03-27 ENCOUNTER — Ambulatory Visit: Payer: Medicare Other | Admitting: Cardiology

## 2020-05-13 ENCOUNTER — Other Ambulatory Visit: Payer: Self-pay

## 2020-05-13 ENCOUNTER — Ambulatory Visit: Payer: Medicare Other | Admitting: Cardiovascular Disease

## 2020-05-13 ENCOUNTER — Encounter: Payer: Self-pay | Admitting: Cardiovascular Disease

## 2020-05-13 VITALS — BP 98/64 | HR 81 | Ht 70.5 in | Wt 192.0 lb

## 2020-05-13 DIAGNOSIS — R7303 Prediabetes: Secondary | ICD-10-CM | POA: Diagnosis not present

## 2020-05-13 DIAGNOSIS — E782 Mixed hyperlipidemia: Secondary | ICD-10-CM | POA: Diagnosis not present

## 2020-05-13 DIAGNOSIS — I2510 Atherosclerotic heart disease of native coronary artery without angina pectoris: Secondary | ICD-10-CM | POA: Diagnosis not present

## 2020-05-13 DIAGNOSIS — R931 Abnormal findings on diagnostic imaging of heart and coronary circulation: Secondary | ICD-10-CM

## 2020-05-13 DIAGNOSIS — R0789 Other chest pain: Secondary | ICD-10-CM

## 2020-05-13 DIAGNOSIS — I2584 Coronary atherosclerosis due to calcified coronary lesion: Secondary | ICD-10-CM

## 2020-05-13 MED ORDER — ATORVASTATIN CALCIUM 40 MG PO TABS: 40.0000 mg | ORAL_TABLET | Freq: Every day | ORAL | 3 refills | Status: DC

## 2020-05-13 NOTE — Patient Instructions (Addendum)
Medication Instructions:  Your physician recommends that you continue on your current medications as directed. Please refer to the Current Medication list given to you today.  *If you need a refill on your cardiac medications before your next appointment, please call your pharmacy*  Lab Work: NONE   Testing/Procedures: NONE  Follow-Up: At [facility], you and your health needs are our priority.  As part of our continuing mission to provide you with exceptional heart care, we have created designated Provider Care Teams.  These Care Teams include your primary Cardiologist (physician) and Advanced Practice Providers (APPs -  Physician Assistants and Nurse Practitioners) who all work together to provide you with the care you need, when you need it.  We recommend signing up for the patient portal called "MyChart".  Sign up information is provided on this After Visit Summary.  MyChart is used to connect with patients for Virtual Visits (Telemedicine).  Patients are able to view lab/test results, encounter notes, upcoming appointments, etc.  Non-urgent messages can be sent to your provider as well.   To learn more about what you can do with MyChart, go to [email or web address].    Your next appointment:   12 month(s)  The format for your next appointment:   In Person  Provider:   DR Claiborne Billings    Other Instructions

## 2020-05-13 NOTE — Progress Notes (Signed)
Cardiology Office Note    Date:  05/13/2020   ID:  Dominique Lane, DOB [birth year], MRN [id number]  PCP:  Hoyt Koch, MD  Cardiologist:  Shelva Majestic, MD   4 month F/U evaluaiton  History of Present Illness:  Dominique Lane is a 59 y.o. female who was  evaluated by Dr. Percival Spanish in June 2021 for evaluation of chest pain referred through Dr. Pricilla Holm. The patient's chest pain was dull coming and going over several days duration and often would occur at rest. It was unassociated with any nausea vomiting or diaphoresis. She denied any exertional precipitation. She underwent an size treadmill test test on December 18, 2019 where she exercised to a 10.1-minute workload, and a normal blood pressure response to exercise and did not develop any chest pain or ECG abnormalities. She also had undergone laboratory on February 01, 2019 which had shown a total cholesterol of 273 with an LDL cholesterol of 196. On December 20, 2019 a calcium score was 76 agonist on units, 85th percentile for age and sex max control. Mild calcification was noted in the proximal LAD.  When I saw her in July when she reestablished care with me she denied any recent exertional chest tightness or pressure. She was recently started on atorvastatin statin 40 mg. Purposefully trying to lose weight and admits to a 15 pound weight loss since January 2021.   Over the last several months, she has significantly made improvement with her diet.  She underwent follow-up laboratory on February 06, 2020 which now showed a total cholesterol of 140, HDL 44, triglycerides 85, and LDL cholesterol at 79.  She has been caring for her soon-to-be 41 year old father and sharing responsibilities with several siblings for this job.  Presently, she feels well.  She is trying to increase her activity.  In August 2020 hemoglobin A1c was 6.1 and this had improved to 5.8 on February 06, 2020.   Past Medical History:  Diagnosis Date  .  Hyperlipidemia   . MVP (mitral valve prolapse)   . Osteopenia 12/2018   T score of -1.9 FRAX 8.8% / 1.0%    Past Surgical History:  Procedure Laterality Date  . CARPAL TUNNEL RELEASE Bilateral   . CHOLECYSTECTOMY    . COLONOSCOPY  2011   Dr Amedeo Plenty  . fractured mandible  1997   repair  . GANGLION CYST EXCISION    . LAPAROSCOPIC LYSIS OF ADHESIONS N/A 09/13/2012   Procedure: LAPAROSCOPIC LYSIS OF ADHESIONS;  Surgeon: Terrance Mass, MD;  Location: [facility];  Service: Gynecology;  Laterality: N/A;  . LAPAROSCOPY N/A 09/13/2012   Procedure: LAPAROSCOPY OPERATIVE;  Surgeon: Terrance Mass, MD;  Location: [facility];  Service: Gynecology;  Laterality: N/A;  . OVARIAN CYST REMOVAL     Right  . ROTATOR CUFF REPAIR     twice on left and once on right  . SALPINGOOPHORECTOMY Bilateral 09/13/2012   Procedure: SALPINGO OOPHORECTOMY;  Surgeon: Terrance Mass, MD;  Location: [facility];  Service: Gynecology;  Laterality: Bilateral;  . TUBAL LIGATION    . VAGINAL HYSTERECTOMY      Current Medications: Outpatient Medications Prior to Visit  Medication Sig Dispense Refill  . aspirin EC 81 MG tablet Take 81 mg by mouth daily. Pt takes every other day.    . calcium gluconate 500 MG tablet Take 1 tablet by mouth daily.    . Multiple Vitamin (MULTI-VITAMIN) tablet Take by mouth daily.    . pantoprazole (PROTONIX)  40 MG tablet Take 1 tablet (40 mg total) by mouth daily. (Patient taking differently: Take 40 mg by mouth daily. Pt takes every other day) 90 tablet 3  . atorvastatin (LIPITOR) 40 MG tablet Take 1 tablet (40 mg total) by mouth daily. 90 tablet 1   No facility-administered medications prior to visit.     Allergies:   Cephalosporins, Naproxen sodium, and Hydromet [hydrocodone-homatropine]   Social History   Socioeconomic History  . Marital status: Divorced    Spouse name: Not on file  . Number of children: 2  . Years of education: Not on file  . Highest education level: Not on file   Occupational History  . Occupation: [employer], production line  Tobacco Use  . Smoking status: Never Smoker  . Smokeless tobacco: Never Used  Vaping Use  . Vaping Use: Never used  Substance and Sexual Activity  . Alcohol use: Yes    Alcohol/week: 0.0 standard drinks    Comment: occasionally; < 2 glasseswine / month  . Drug use: No  . Sexual activity: Not Currently    Birth control/protection: Surgical    Comment: 1st intercourse 13 yo-5 partners  Other Topics Concern  . Not on file  Social History Narrative   Lives alone.     Social Determinants of Health   Financial Resource Strain:   . Difficulty of Paying Living Expenses: Not on file  Food Insecurity:   . Worried About Charity fundraiser in the Last Year: Not on file  . Ran Out of Food in the Last Year: Not on file  Transportation Needs:   . Lack of Transportation (Medical): Not on file  . Lack of Transportation (Non-Medical): Not on file  Physical Activity:   . Days of Exercise per Week: Not on file  . Minutes of Exercise per Session: Not on file  Stress:   . Feeling of Stress : Not on file  Social Connections:   . Frequency of Communication with Friends and Family: Not on file  . Frequency of Social Gatherings with Friends and Family: Not on file  . Attends Religious Services: Not on file  . Active Member of Clubs or Organizations: Not on file  . Attends Archivist Meetings: Not on file  . Marital Status: Not on file     Family History:  The patient's family history includes Bladder Cancer (age of onset: 21) in her father; Heart disease (age of onset: 63) in her mother; Hypertension in her sister; Leukemia in her paternal grandfather; Lymphoma in her paternal aunt; Ovarian cancer (age of onset: 20) in her sister.   ROS General: Negative; No fevers, chills, or night sweats;  HEENT: Negative; No changes in vision or hearing, sinus congestion, difficulty swallowing Pulmonary: Negative; No cough,  wheezing, shortness of breath, hemoptysis Cardiovascular: Negative; No chest pain, presyncope, syncope, palpitations GI: Negative; No nausea, vomiting, diarrhea, or abdominal pain GU: Negative; No dysuria, hematuria, or difficulty voiding Musculoskeletal: Negative; no myalgias, joint pain, or weakness Hematologic/Oncology: Negative; no easy bruising, bleeding Endocrine: Negative; no heat/cold intolerance; no diabetes Neuro: Negative; no changes in balance, headaches Skin: Negative; No rashes or skin lesions Psychiatric: Negative; No behavioral problems, depression Sleep: Negative; No snoring, daytime sleepiness, hypersomnolence, bruxism, restless legs, hypnogognic hallucinations, no cataplexy Other comprehensive 14 point system review is negative.   PHYSICAL EXAM:   VS:  BP 98/64 (BP Location: Left Arm, Patient Position: Sitting)   Pulse 81   Ht 5' 10.5" (1.791 m)  Wt 192 lb (87.1 kg)   SpO2 96%   BMI 27.16 kg/m     Repeat blood pressure by me was 104/64  Wt Readings from Last 3 Encounters:  05/13/20 192 lb (87.1 kg)  02/06/20 195 lb (88.5 kg)  01/07/20 199 lb 6.4 oz (90.4 kg)    General: Alert, oriented, no distress.  Skin: normal turgor, no rashes, warm and dry HEENT: Normocephalic, atraumatic. Pupils equal round and reactive to light; sclera anicteric; extraocular muscles intact;  Nose without nasal septal hypertrophy Mouth/Parynx benign; Mallinpatti scale 3 Neck: No JVD, no carotid bruits; normal carotid upstroke Lungs: clear to ausculatation and percussion; no wheezing or rales Chest wall: without tenderness to palpitation Heart: PMI not displaced, RRR, s1 s2 normal, 1/6 systolic murmur, no diastolic murmur, no rubs, gallops, thrills, or heaves Abdomen: soft, nontender; no hepatosplenomehaly, BS+; abdominal aorta nontender and not dilated by palpation. Back: no CVA tenderness Pulses 2+ Musculoskeletal: full range of motion, normal strength, no joint  deformities Extremities: no clubbing cyanosis or edema, Homan's sign negative  Neurologic: grossly nonfocal; Cranial nerves grossly wnl Psychologic: Normal mood and affect   Studies/Labs Reviewed:   EKG:  EKG is ordered today.  ECG (independently read by me): NSR at 81, no ectopy, normal intervals  July 2021 ECG (independently read by me): NSR at 66; no ectopy; normal intervals  Recent Labs: BMP Latest Ref Rng & Units 02/06/2020 02/01/2019 08/08/2017  Glucose 65 - 99 mg/dL 92 88 91  BUN 7 - 25 mg/dL _0 Creatinine 0.50 - 0.99 mg/dL 0.82 0.82 0.77  BUN/Creat Ratio 6 - 22 (calc) NOT APPLICABLE - -  Sodium 149 - 146 mmol/L 139 138 140  Potassium 3.5 - 5.3 mmol/L 4.3 4.0 4.2  Chloride 98 - 110 mmol/L 106 105 106  CO2 20 - 32 mmol/L _1 Calcium 8.6 - 10.4 mg/dL 9.4 9.4 9.1     Hepatic Function Latest Ref Rng & Units 02/06/2020 02/01/2019 08/08/2017  Total Protein 6.1 - 8.1 g/dL 6.9 7.4 7.0  Albumin 3.5 - 5.2 g/dL - 4.5 4.1  AST 10 - 35 U/L _2 ALT 6 - 29 U/L _3 Alk Phosphatase 39 - 117 U/L - 106 85  Total Bilirubin 0.2 - 1.2 mg/dL 0.5 0.5 0.5  Bilirubin, Direct 0.0 - 0.3 mg/dL - - -    CBC Latest Ref Rng & Units 02/06/2020 02/01/2019 08/08/2017  WBC 3.8 - 10.8 Thousand/uL 5.9 6.1 5.2  Hemoglobin 11.7 - 15.5 g/dL 13.1 13.4 12.8  Hematocrit 35 - 45 % 38.9 40.3 37.8  Platelets 140 - 400 Thousand/uL 225 233.0 243   Lab Results  Component Value Date   MCV 89.2 02/06/2020   MCV 88.9 02/01/2019   MCV 86.5 08/08/2017   Lab Results  Component Value Date   TSH 1.71 01/28/2014   Lab Results  Component Value Date   HGBA1C 5.8 (H) 02/06/2020     BNP No results found for: BNP  ProBNP    Component Value Date/Time   PROBNP 25.0 09/26/2008 1448     Lipid Panel     Component Value Date/Time   CHOL 140 02/06/2020 1032   TRIG 85 02/06/2020 1032   HDL 44 (L) 02/06/2020 1032   CHOLHDL 3.2 02/06/2020 1032   VLDL 31.8 02/01/2019 1111   LDLCALC 79 02/06/2020  1032   LDLDIRECT 170.0 08/08/2017 1033     RADIOLOGY: No results found.   Additional studies/  records that were reviewed today include:  Recent laboratory done on February 06, 2020 by Dr. Belenda Cruise  ASSESSMENT:    1. Mixed hyperlipidemia   2. Atypical chest pain   3. Coronary artery calcification   4. Agatston coronary artery calcium score less than 100   5. Prediabetes    PLAN:  Ms. Alesa Echevarria is a 59 year old female who  developed chest pain with some atypical features. Her exercise treadmill test was negative for ischemia and she was able to achieve a 10.1 met workload. She has a history of significant hyperlipidemia raising concern for possible familial hyperlipidemia particularly with LDL at 196 in August 2020. She was found to have a cardiac calcium score of 71 representing 85th percentile for age and sex matched control. Calcium was noted in the proximal LAD.  When I saw her in July when she reestablished care with me after not having seen her in over 12 years, I felt her recent chest pain was atypical for ischemic etiology.  She was just started on atorvastatin 40 mg and I recommended target LDL ideally less than 70 in an attempt to induce plaque regression plaque stability.  She underwent follow-up laboratory on February 06, 2020 by Dr. Sharlet Salina which does show significant improvement with total cholesterol now 140, HDL 44, triglycerides 85 and LDL 79.  She continues to tolerate atorvastatin well and denies any myalgias.  She stays active.  She has been caring for her soon-to-be 34 year old father which does take some time away from her recent increased exercise.  She has been trying to significantly adjust her diet.  We discussed the difference between saturated fat polyunsaturated and monounsaturated fats with reference to good and bad fats.  BMI is 27.16.  Her blood pressure today is stable.  She continues to take pantoprazole as needed for GERD.  She is without recurrent chest pain.   She will undergo repeat laboratory with her primary physician next year.  As long as she is stable I will see her in 1 year for follow-up evaluation or sooner as needed.   Medication Adjustments/Labs and Tests Ordered: Current medicines are reviewed at length with the patient today.  Concerns regarding medicines are outlined above.  Medication changes, Labs and Tests ordered today are listed in the Patient Instructions below. Patient Instructions  Medication Instructions:  Your physician recommends that you continue on your current medications as directed. Please refer to the Current Medication list given to you today.  *If you need a refill on your cardiac medications before your next appointment, please call your pharmacy*  Lab Work: NONE   Testing/Procedures: NONE  Follow-Up: At [facility], you and your health needs are our priority.  As part of our continuing mission to provide you with exceptional heart care, we have created designated Provider Care Teams.  These Care Teams include your primary Cardiologist (physician) and Advanced Practice Providers (APPs -  Physician Assistants and Nurse Practitioners) who all work together to provide you with the care you need, when you need it.  We recommend signing up for the patient portal called "MyChart".  Sign up information is provided on this After Visit Summary.  MyChart is used to connect with patients for Virtual Visits (Telemedicine).  Patients are able to view lab/test results, encounter notes, upcoming appointments, etc.  Non-urgent messages can be sent to your provider as well.   To learn more about what you can do with MyChart, go to [email or web address].    Your next appointment:  12 month(s)  The format for your next appointment:   In Person  Provider:   DR Claiborne Billings    Other Instructions       Signed, Shelva Majestic, MD  05/13/2020 2:24 PM    [facility] [address], [place],  [place], [place]  [address] Phone: [phone]

## 2020-08-19 ENCOUNTER — Other Ambulatory Visit: Payer: Self-pay | Admitting: Obstetrics and Gynecology

## 2020-08-19 DIAGNOSIS — N6310 Unspecified lump in the right breast, unspecified quadrant: Secondary | ICD-10-CM

## 2020-09-10 DIAGNOSIS — Z20822 Contact with and (suspected) exposure to covid-19: Secondary | ICD-10-CM | POA: Diagnosis not present

## 2020-10-13 NOTE — Progress Notes (Signed)
GYNECOLOGY  VISIT  CC:   Bump near rectum  HPI: 60 y.o. [id number] Divorced White or Caucasian female here for raised area near rectum.   Has noticed a bump that is growing near rectum, no pain, no bleeding. She just noticed it initially when washing several months ago (around the first of the year).  GYNECOLOGIC HISTORY: No LMP recorded. Patient has had a hysterectomy. Contraception: hysterectomy Menopausal hormone therapy: none  Patient Active Problem List   Diagnosis Date Noted  . Supraclavicular fossa fullness 12/18/2016  . Annual physical exam 12/05/2014  . Prediabetes 06/12/2014  . MRSA carrier 09/08/2012  . HYPERLIPIDEMIA 04/10/2007  . GERD (gastroesophageal reflux disease) 04/10/2007    Past Medical History:  Diagnosis Date  . Hyperlipidemia   . MVP (mitral valve prolapse)   . Osteopenia 12/2018   T score of -1.9 FRAX 8.8% / 1.0%    Past Surgical History:  Procedure Laterality Date  . CARPAL TUNNEL RELEASE Bilateral   . CHOLECYSTECTOMY    . COLONOSCOPY  2011   Dr Amedeo Plenty  . fractured mandible  1997   repair  . GANGLION CYST EXCISION    . LAPAROSCOPIC LYSIS OF ADHESIONS N/A 09/13/2012   Procedure: LAPAROSCOPIC LYSIS OF ADHESIONS;  Surgeon: Terrance Mass, MD;  Location: [facility];  Service: Gynecology;  Laterality: N/A;  . LAPAROSCOPY N/A 09/13/2012   Procedure: LAPAROSCOPY OPERATIVE;  Surgeon: Terrance Mass, MD;  Location: [facility];  Service: Gynecology;  Laterality: N/A;  . OVARIAN CYST REMOVAL     Right  . ROTATOR CUFF REPAIR     twice on left and once on right  . SALPINGOOPHORECTOMY Bilateral 09/13/2012   Procedure: SALPINGO OOPHORECTOMY;  Surgeon: Terrance Mass, MD;  Location: [facility];  Service: Gynecology;  Laterality: Bilateral;  . TUBAL LIGATION    . VAGINAL HYSTERECTOMY      MEDS:   Current Outpatient Medications on File Prior to Visit  Medication Sig Dispense Refill  . aspirin EC 81 MG tablet Take 81 mg by mouth daily. Pt takes every other day.     Marland Kitchen atorvastatin (LIPITOR) 40 MG tablet Take 1 tablet (40 mg total) by mouth daily. 90 tablet 3  . calcium gluconate 500 MG tablet Take 1 tablet by mouth.    . Multiple Vitamin (MULTI-VITAMIN) tablet Take by mouth daily.    . pantoprazole (PROTONIX) 40 MG tablet Take 1 tablet (40 mg total) by mouth daily. (Patient taking differently: Take 40 mg by mouth daily. Pt takes every other day) 90 tablet 3   No current facility-administered medications on file prior to visit.    ALLERGIES: Cephalosporins, Naproxen sodium, and Hydromet [hydrocodone-homatropine]  Family History  Problem Relation Age of Onset  . Heart disease Mother 67       Sudden death.   . Bladder Cancer Father 43  . Lymphoma Paternal Aunt   . Hypertension Sister   . Ovarian cancer Sister 75  . Leukemia Paternal Grandfather   . CAD Neg Hx   . Colon cancer Neg Hx   . Breast cancer Neg Hx      Review of Systems  Constitutional: Negative.   HENT: Negative.   Eyes: Negative.   Respiratory: Negative.   Cardiovascular: Negative.   Gastrointestinal: Negative.   Endocrine: Negative.   Genitourinary: Negative.   Musculoskeletal: Negative.   Skin:       Raised area near rectum  Allergic/Immunologic: Negative.   Neurological: Negative.   Hematological: Negative.   Psychiatric/Behavioral: Negative.  PHYSICAL EXAMINATION:    BP 118/70   Pulse 76   Resp 16   Wt 194 lb (88 kg)   BMI 27.44 kg/m     General appearance: alert, cooperative, no acute distress  Physical Exam Genitourinary:       Assessment: Epidermal cyst  Plan: At this time, no treatment is necessary Encouraged not to try to "pop" it, it would likely return Advised to call if becomes painful or irritating F/U for annual exam (scheduled with Dr. Dellis Filbert August 2022)

## 2020-10-14 ENCOUNTER — Encounter: Payer: Self-pay | Admitting: Nurse Practitioner

## 2020-10-14 ENCOUNTER — Other Ambulatory Visit: Payer: Self-pay

## 2020-10-14 ENCOUNTER — Ambulatory Visit: Payer: Medicare Other | Admitting: Nurse Practitioner

## 2020-10-14 VITALS — BP 118/70 | HR 76 | Resp 16 | Wt 194.0 lb

## 2020-10-14 DIAGNOSIS — L720 Epidermal cyst: Secondary | ICD-10-CM

## 2020-10-14 NOTE — Patient Instructions (Signed)
Epidermoid Cyst  An epidermoid cyst, also known as epidermal cyst, is a sac made of skin tissue. The sac contains a substance called keratin. Keratin is a protein that is normally secreted through the hair follicles. When keratin becomes trapped in the top layer of skin (epidermis), it can form an epidermoid cyst. Epidermoid cysts can be found anywhere on your body. These cysts are usually harmless (benign), and they may not cause symptoms unless they become inflamed or infected. What are the causes? This condition may be caused by:  A blocked hair follicle.  A hair that curls and re-enters the skin instead of growing straight out of the skin (ingrown hair).  A blocked pore.  Irritated skin.  An injury to the skin.  Certain conditions that are passed along from parent to child (inherited).  Human papillomavirus (HPV). This happens rarely when cysts occur on the bottom of the feet.  Long-term (chronic) sun damage to the skin. What increases the risk? The following factors may make you more likely to develop an epidermoid cyst:  Having acne.  Being female.  Having an injury to the skin.  Being past puberty.  Having certain rare genetic disorders. What are the signs or symptoms? The only symptom of this condition may be a small, painless lump underneath the skin. When an epidermal cyst ruptures, it may become inflamed. True infection in cysts is rare. Symptoms may include:  Redness.  Inflammation.  Tenderness.  Warmth.  Keratin draining from the cyst. Keratin is grayish-white, bad-smelling substance.  Pus draining from the cyst. How is this diagnosed? This condition is diagnosed with a physical exam.  In some cases, you may have a sample of tissue (biopsy) taken from your cyst to be examined under a microscope or tested for bacteria.  You may be referred to a health care provider who specializes in skin care (dermatologist). How is this treated? If a cyst becomes  inflamed, treatment may include:  Opening and draining the cyst, done by a health care provider. After draining, minor surgery to remove the rest of the cyst may be done.  Taking antibiotic medicine.  Having injections of medicines (steroids) that help to reduce inflammation.  Having surgery to remove the cyst. Surgery may be done if the cyst: ? Becomes large. ? Bothers you. ? Has a chance of turning into cancer.  Do not try to open a cyst yourself. Follow these instructions at home: Medicines  If you were prescribed an antibiotic medicine, take it it as told by your health care provider. Do not stop using the antibiotic even if you start to feel better.  Take over-the-counter and prescription medicines only as told by your health care provider. General instructions  Keep the area around your cyst clean and dry.  Wear loose, dry clothing.  Avoid touching your cyst.  Check your cyst every day for signs of infection. Check for: ? Redness, swelling, or pain. ? Fluid or blood. ? Warmth. ? Pus or a bad smell.  Keep all follow-up visits. This is important. How is this prevented?  Wear clean, dry, clothing.  Avoid wearing tight clothing.  Keep your skin clean and dry. Take showers or baths every day. Contact a health care provider if:  Your cyst develops symptoms of infection.  Your condition is not improving or is getting worse.  You develop a cyst that looks different from other cysts you have had.  You have a fever. Get help right away if:  Redness spreads  from the cyst into the surrounding area. Summary  An epidermoid cyst is a sac made of skin tissue. These cysts are usually harmless (benign), and they may not cause symptoms unless they become inflamed.  If a cyst becomes inflamed, treatment may include surgery to open and drain the cyst, or to remove it. Treatment may also include medicines by mouth or through an injection.  Take over-the-counter and  prescription medicines only as told by your health care provider. If you were prescribed an antibiotic medicine, take it as told by your health care provider. Do not stop using the antibiotic even if you start to feel better.  Contact a health care provider if your condition is not improving or is getting worse.  Keep all follow-up visits as told by your health care provider. This is important. This information is not intended to replace advice given to you by your health care provider. Make sure you discuss any questions you have with your health care provider. Document Revised: 09/19/2019 Document Reviewed: 09/19/2019 Elsevier Patient Education  [place].

## 2020-11-05 DIAGNOSIS — Z20822 Contact with and (suspected) exposure to covid-19: Secondary | ICD-10-CM | POA: Diagnosis not present

## 2020-12-22 ENCOUNTER — Telehealth: Payer: Self-pay | Admitting: *Deleted

## 2020-12-22 DIAGNOSIS — R911 Solitary pulmonary nodule: Secondary | ICD-10-CM

## 2020-12-22 NOTE — Telephone Encounter (Signed)
Order placed for CT scan.  

## 2020-12-22 NOTE — Telephone Encounter (Signed)
-----   Message from Minus Breeding, MD sent at 12/19/2020  8:23 AM EDT ----- She is to have a non contrasted chest CT for a pulm nodule .  Due in June and I am not sure she got the message.  Thanks.

## 2020-12-26 ENCOUNTER — Other Ambulatory Visit: Payer: Medicare Other

## 2021-02-04 ENCOUNTER — Other Ambulatory Visit: Payer: Self-pay | Admitting: Internal Medicine

## 2021-02-04 ENCOUNTER — Other Ambulatory Visit: Payer: Self-pay | Admitting: Nurse Practitioner

## 2021-02-04 DIAGNOSIS — N6310 Unspecified lump in the right breast, unspecified quadrant: Secondary | ICD-10-CM

## 2021-02-09 ENCOUNTER — Other Ambulatory Visit: Payer: Self-pay

## 2021-02-09 ENCOUNTER — Ambulatory Visit (INDEPENDENT_AMBULATORY_CARE_PROVIDER_SITE_OTHER): Payer: Medicare Other | Admitting: Internal Medicine

## 2021-02-09 ENCOUNTER — Telehealth: Payer: Self-pay | Admitting: Internal Medicine

## 2021-02-09 ENCOUNTER — Ambulatory Visit
Admission: RE | Admit: 2021-02-09 | Discharge: 2021-02-09 | Disposition: A | Discharge status: Home / Self Care | Payer: Medicare Other | Source: Ambulatory Visit | Attending: Obstetrics and Gynecology | Admitting: Obstetrics and Gynecology

## 2021-02-09 ENCOUNTER — Encounter: Payer: Self-pay | Admitting: Internal Medicine

## 2021-02-09 VITALS — BP 120/70 | HR 63 | Temp 98.1°F | Resp 18 | Ht 70.5 in | Wt 198.4 lb

## 2021-02-09 DIAGNOSIS — I700 Atherosclerosis of aorta: Secondary | ICD-10-CM | POA: Diagnosis not present

## 2021-02-09 DIAGNOSIS — Z0000 Encounter for general adult medical examination without abnormal findings: Secondary | ICD-10-CM | POA: Diagnosis not present

## 2021-02-09 DIAGNOSIS — N6310 Unspecified lump in the right breast, unspecified quadrant: Secondary | ICD-10-CM

## 2021-02-09 DIAGNOSIS — K219 Gastro-esophageal reflux disease without esophagitis: Secondary | ICD-10-CM | POA: Diagnosis not present

## 2021-02-09 DIAGNOSIS — R922 Inconclusive mammogram: Secondary | ICD-10-CM | POA: Diagnosis not present

## 2021-02-09 DIAGNOSIS — R7303 Prediabetes: Secondary | ICD-10-CM | POA: Diagnosis not present

## 2021-02-09 DIAGNOSIS — E782 Mixed hyperlipidemia: Secondary | ICD-10-CM | POA: Diagnosis not present

## 2021-02-09 LAB — HEMOGLOBIN A1C: Hgb A1c MFr Bld: 5.9 % (ref 4.6–6.5)

## 2021-02-09 LAB — COMPREHENSIVE METABOLIC PANEL
ALT: 24 U/L (ref 0–35)
AST: 25 U/L (ref 0–37)
Albumin: 4.5 g/dL (ref 3.5–5.2)
Alkaline Phosphatase: 107 U/L (ref 39–117)
BUN: 13 mg/dL (ref 6–23)
CO2: 28 mEq/L (ref 19–32)
Calcium: 9.3 mg/dL (ref 8.4–10.5)
Chloride: 104 mEq/L (ref 96–112)
Creatinine, Ser: 0.77 mg/dL (ref 0.40–1.20)
GFR: 82.78 mL/min (ref >60.00)
Glucose, Bld: 74 mg/dL (ref 70–99)
Potassium: 3.6 mEq/L (ref 3.5–5.1)
Sodium: 140 mEq/L (ref 135–145)
Total Bilirubin: 0.7 mg/dL (ref 0.2–1.2)
Total Protein: 7.4 g/dL (ref 6.0–8.3)

## 2021-02-09 LAB — CBC
HCT: 40.2 % (ref 36.0–46.0)
Hemoglobin: 13.2 g/dL (ref 12.0–15.0)
MCHC: 32.8 g/dL (ref 30.0–36.0)
MCV: 92.2 fl (ref 78.0–100.0)
Platelets: 212 10*3/uL (ref 150.0–400.0)
RBC: 4.36 Mil/uL (ref 3.87–5.11)
RDW: 13.6 % (ref 11.5–15.5)
WBC: 5.9 10*3/uL (ref 4.0–10.5)

## 2021-02-09 LAB — LIPID PANEL
Cholesterol: 143 mg/dL (ref 0–200)
HDL: 54 mg/dL (ref >39.00)
LDL Cholesterol: 71 mg/dL (ref 0–99)
NonHDL: 89.4
Total CHOL/HDL Ratio: 3
Triglycerides: 93 mg/dL (ref 0.0–149.0)
VLDL: 18.6 mg/dL (ref 0.0–40.0)

## 2021-02-09 NOTE — Telephone Encounter (Signed)
See below

## 2021-02-09 NOTE — Progress Notes (Signed)
   Subjective:   Patient ID: Dominique Lane, female    DOB: January 05, 1961, 60 y.o.   MRN: [id number]  HPI The patient is a 60 YO female coming in for physical.   PMH, [place], social history reviewed and updated  Review of Systems  Constitutional: Negative.   HENT: Negative.    Eyes: Negative.   Respiratory:  Negative for cough, chest tightness and shortness of breath.   Cardiovascular:  Negative for chest pain, palpitations and leg swelling.  Gastrointestinal:  Negative for abdominal distention, abdominal pain, constipation, diarrhea, nausea and vomiting.  Musculoskeletal: Negative.   Skin: Negative.   Neurological: Negative.   Psychiatric/Behavioral: Negative.     Objective:  Physical Exam Constitutional:      Appearance: She is well-developed.  HENT:     Head: Normocephalic and atraumatic.  Cardiovascular:     Rate and Rhythm: Normal rate and regular rhythm.  Pulmonary:     Effort: Pulmonary effort is normal. No respiratory distress.     Breath sounds: Normal breath sounds. No wheezing or rales.  Abdominal:     General: Bowel sounds are normal. There is no distension.     Palpations: Abdomen is soft.     Tenderness: There is no abdominal tenderness. There is no rebound.  Musculoskeletal:     Cervical back: Normal range of motion.  Skin:    General: Skin is warm and dry.  Neurological:     Mental Status: She is alert and oriented to person, place, and time.     Coordination: Coordination normal.    Vitals:   02/09/21 1306  BP: 120/70  Pulse: 63  Resp: 18  Temp: 98.1 F (36.7 C)  TempSrc: Oral  SpO2: 97%  Weight: 198 lb 6.4 oz (90 kg)  Height: 5' 10.5" (1.791 m)    This visit occurred during the SARS-CoV-2 public health emergency.  Safety protocols were in place, including screening questions prior to the visit, additional usage of staff PPE, and extensive cleaning of exam room while observing appropriate contact time as indicated for disinfecting solutions.    Assessment & Plan:

## 2021-02-09 NOTE — Assessment & Plan Note (Signed)
Checking HgA1c and adjust as needed.  

## 2021-02-09 NOTE — Telephone Encounter (Signed)
Since her visit is at 1pm the labs even if done now will not be back for the visit I would recommend to just get done during visit. In future if she wants labs in advance of visit she should call or reach out 3-5 days prior to visit.

## 2021-02-09 NOTE — Assessment & Plan Note (Signed)
Taking lipitor 40 mg daily and will continue. 

## 2021-02-09 NOTE — Assessment & Plan Note (Signed)
Taking lipitor 40 mg daily. Checking lipid panel and adjust as needed.

## 2021-02-09 NOTE — Telephone Encounter (Signed)
Patient has physical scheduled for this afternoon at 1pm  Wants to know if provider can scheduled labs this morning for her to be done before visit   Please follow up with patient [phone]. [phone]

## 2021-02-09 NOTE — Assessment & Plan Note (Signed)
Flu shot yearly. Covid-19 4 shots. Shingrix declines. Tetanus up to date. Colonoscopy scheduled for October 2022. Mammogram follow up imaging later today, pap smear up to date with gyn getting next week. Counseled about sun safety and mole surveillance. Counseled about the dangers of distracted driving. Given 10 year screening recommendations.

## 2021-02-09 NOTE — Assessment & Plan Note (Signed)
Taking protonix every other day and is controlled. Refill when needed.

## 2021-02-20 ENCOUNTER — Other Ambulatory Visit (HOSPITAL_COMMUNITY)
Admission: RE | Admit: 2021-02-20 | Discharge: 2021-02-20 | Disposition: A | Discharge status: Home / Self Care | Payer: Medicare Other | Source: Ambulatory Visit | Attending: Obstetrics & Gynecology | Admitting: Obstetrics & Gynecology

## 2021-02-20 ENCOUNTER — Other Ambulatory Visit: Payer: Self-pay

## 2021-02-20 ENCOUNTER — Encounter: Payer: Self-pay | Admitting: Obstetrics & Gynecology

## 2021-02-20 ENCOUNTER — Ambulatory Visit (INDEPENDENT_AMBULATORY_CARE_PROVIDER_SITE_OTHER): Payer: Medicare Other | Admitting: Obstetrics & Gynecology

## 2021-02-20 VITALS — BP 118/80 | HR 72 | Resp 16 | Ht 68.5 in | Wt 199.0 lb

## 2021-02-20 DIAGNOSIS — Z780 Asymptomatic menopausal state: Secondary | ICD-10-CM | POA: Diagnosis not present

## 2021-02-20 DIAGNOSIS — Z90710 Acquired absence of both cervix and uterus: Secondary | ICD-10-CM

## 2021-02-20 DIAGNOSIS — M8589 Other specified disorders of bone density and structure, multiple sites: Secondary | ICD-10-CM

## 2021-02-20 DIAGNOSIS — Z01419 Encounter for gynecological examination (general) (routine) without abnormal findings: Secondary | ICD-10-CM | POA: Diagnosis present

## 2021-02-20 DIAGNOSIS — Z1272 Encounter for screening for malignant neoplasm of vagina: Secondary | ICD-10-CM | POA: Insufficient documentation

## 2021-02-20 DIAGNOSIS — Z9079 Acquired absence of other genital organ(s): Secondary | ICD-10-CM

## 2021-02-20 DIAGNOSIS — Z90722 Acquired absence of ovaries, bilateral: Secondary | ICD-10-CM

## 2021-02-20 NOTE — Progress Notes (Signed)
Dominique Lane December 07, 1960 [id number]   History:    60 y.o. G2P2002 female.  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   RP:  Established patient for annual gynecologic exam.     HPI: Status post TVH with subsequent BSO in the past.  Postmenopausal, well on no HRT.  No significant menopausal symptoms.  No pelvic pain.  Abstinent.  Breasts normal.  Mammography 02/09/2021 Probably benign.  Repeat Bilat Dx mammo/RT [place] in 1 year.  Osteopenia on BD 01/2019, will repeat here now.  Colonoscopy 2011, repeat this year.  Health labs with Fam MD.  BMI 29.82.  Limited shoulder mobility.  Past medical history,surgical history, family history and social history were all reviewed and documented in the EPIC chart.  Gynecologic History No LMP recorded. Patient has had a hysterectomy.  Obstetric History OB History  Gravida Para Term Preterm AB Living  '2 2 2     2  '$ SAB IAB Ectopic Multiple Live Births               # Outcome Date GA Lbr Len/2nd Weight Sex Delivery Anes PTL Lv  2 Term           1 Term              ROS: A ROS was performed and pertinent positives and negatives are included in the history.  GENERAL: No fevers or chills. HEENT: No change in vision, no earache, sore throat or sinus congestion. NECK: No pain or stiffness. CARDIOVASCULAR: No chest pain or pressure. No palpitations. PULMONARY: No shortness of breath, cough or wheeze. GASTROINTESTINAL: No abdominal pain, nausea, vomiting or diarrhea, melena or bright red blood per rectum. GENITOURINARY: No urinary frequency, urgency, hesitancy or dysuria. MUSCULOSKELETAL: No joint or muscle pain, no back pain, no recent trauma. DERMATOLOGIC: No rash, no itching, no lesions. ENDOCRINE: No polyuria, polydipsia, no heat or cold intolerance. No recent change in weight. HEMATOLOGICAL: No anemia or easy bruising or bleeding. NEUROLOGIC: No headache, seizures, numbness, tingling or weakness. PSYCHIATRIC: No depression, no loss of interest in normal activity or  change in sleep pattern.     Exam:   BP 118/80   Pulse 72   Resp 16   Ht 5' 8.5" (1.74 m)   Wt 199 lb (90.3 kg)   BMI 29.82 kg/m   Body mass index is 29.82 kg/m.  General appearance : Well developed well nourished female. No acute distress HEENT: Eyes: no retinal hemorrhage or exudates,  Neck supple, trachea midline, no carotid bruits, no thyroidmegaly Lungs: Clear to auscultation, no rhonchi or wheezes, or rib retractions  Heart: Regular rate and rhythm, no murmurs or gallops Breast:Examined in sitting and supine position were symmetrical in appearance, no palpable masses or tenderness,  no skin retraction, no nipple inversion, no nipple discharge, no skin discoloration, no axillary or supraclavicular lymphadenopathy Abdomen: no palpable masses or tenderness, no rebound or guarding Extremities: no edema or skin discoloration or tenderness  Pelvic: Vulva: Normal             Vagina: No gross lesions or discharge.  Pap reflex done.  Cervix/Uterus absent  Adnexa  Without masses or tenderness  Anus: Normal   Assessment/Plan:  60 y.o. female for annual exam   1. Encounter for Papanicolaou smear of vagina as part of routine gynecological examination Gynecologic exam status post total hysterectomy with BSO.  Pap reflex done at the vaginal vault.  Breast exam normal.  Bilateral diagnostic mammogram and right breast ultrasound  probably benign in August 2022.  Had a benign right breast biopsy in September 2021.  We will repeat colonoscopy this year.  Health labs with family physician.  Body mass index 29.82.  Limited physical activity because of joint issues.  2. S/P total hysterectomy and BSO (bilateral salpingo-oophorectomy)  3. Postmenopause Well on no hormone replacement therapy.  4. Osteopenia of multiple sites Osteopenia on bone density August 2020.  We will repeat a bone density here now.  Vitamin D supplements, calcium intake of 1.5 g/day and regular weightbearing physical  activities. - DG Bone Density; Future   Princess Bruins MD, 1:40 PM 02/20/2021

## 2021-02-21 ENCOUNTER — Encounter: Payer: Self-pay | Admitting: Obstetrics & Gynecology

## 2021-02-24 LAB — CYTOLOGY - PAP: Diagnosis: NEGATIVE

## 2021-03-01 ENCOUNTER — Other Ambulatory Visit: Payer: Self-pay

## 2021-03-01 ENCOUNTER — Ambulatory Visit
Admission: EM | Admit: 2021-03-01 | Discharge: 2021-03-01 | Disposition: A | Discharge status: Home / Self Care | Payer: Medicare Other | Attending: Urgent Care | Admitting: Urgent Care

## 2021-03-01 DIAGNOSIS — L249 Irritant contact dermatitis, unspecified cause: Secondary | ICD-10-CM | POA: Diagnosis not present

## 2021-03-01 MED ORDER — HYDROXYZINE HCL 25 MG PO TABS: 12.5000 mg | ORAL_TABLET | Freq: Three times a day (TID) | ORAL | 0 refills | Status: DC | PRN

## 2021-03-01 MED ORDER — BETAMETHASONE DIPROPIONATE 0.05 % EX OINT: TOPICAL_OINTMENT | Freq: Two times a day (BID) | CUTANEOUS | 0 refills | Status: DC

## 2021-03-01 NOTE — ED Triage Notes (Signed)
Onset last night of slightly pruritic rash on her chest and the right side of her neck. No ointments applied. No meds taken. No new shampoo, soaps or lotions used.

## 2021-03-01 NOTE — ED Provider Notes (Signed)
[place]   MRN: [id number] DOB: 12-Mar-1961  Subjective:   Dominique Lane is a 60 y.o. female presenting for 2-week history of persistent intermittent itchy spots over her chest and lower legs.  Patient states that her sister has similar symptoms, both of them take care of her father in his own home.  Unfortunately, this is where the lesions come from.  She has been using Benadryl with minimal relief. Denies eating any new foods, starting new medications, exposure to poisonous plants, new hygiene products, new cleaning products or detergents otherwise in their own homes.  She has had issues with roaches at his house but feels like they cleared that.  No current facility-administered medications for this encounter.  Current Outpatient Medications:    aspirin EC 81 MG tablet, Take 81 mg by mouth daily. Pt takes every other day., Disp: , Rfl:    atorvastatin (LIPITOR) 40 MG tablet, Take 1 tablet (40 mg total) by mouth daily., Disp: 90 tablet, Rfl: 3   calcium gluconate 500 MG tablet, Take 1 tablet by mouth., Disp: , Rfl:    Multiple Vitamin (MULTI-VITAMIN) tablet, Take by mouth every other day., Disp: , Rfl:    pantoprazole (PROTONIX) 40 MG tablet, Take 1 tablet (40 mg total) by mouth daily. (Patient taking differently: Take 40 mg by mouth daily. Pt takes every other day), Disp: 90 tablet, Rfl: 3   Allergies  Allergen Reactions   Cephalosporins Hives    All over the body.   Naproxen Sodium Anaphylaxis   Hydromet [Hydrocodone Bit-Homatrop Mbr] Itching    Past Medical History:  Diagnosis Date   Hyperlipidemia    MVP (mitral valve prolapse)    Osteopenia 12/2018   T score of -1.9 FRAX 8.8% / 1.0%     Past Surgical History:  Procedure Laterality Date   CARPAL TUNNEL RELEASE Bilateral    CHOLECYSTECTOMY     COLONOSCOPY  2011   Dr Amedeo Plenty   fractured mandible  1997   repair   GANGLION CYST EXCISION     LAPAROSCOPIC LYSIS OF ADHESIONS N/A 09/13/2012   Procedure:  LAPAROSCOPIC LYSIS OF ADHESIONS;  Surgeon: Terrance Mass, MD;  Location: [facility];  Service: Gynecology;  Laterality: N/A;   LAPAROSCOPY N/A 09/13/2012   Procedure: LAPAROSCOPY OPERATIVE;  Surgeon: Terrance Mass, MD;  Location: [facility];  Service: Gynecology;  Laterality: N/A;   OVARIAN CYST REMOVAL     Right   ROTATOR CUFF REPAIR     twice on left and once on right   SALPINGOOPHORECTOMY Bilateral 09/13/2012   Procedure: SALPINGO OOPHORECTOMY;  Surgeon: Terrance Mass, MD;  Location: [facility];  Service: Gynecology;  Laterality: Bilateral;   TUBAL LIGATION     VAGINAL HYSTERECTOMY      Family History  Problem Relation Age of Onset   Heart disease Mother 90       Sudden death.    Bladder Cancer Father 57   Lymphoma Paternal Aunt    Hypertension Sister    Ovarian cancer Sister 1   Leukemia Paternal Grandfather    CAD Neg Hx    Colon cancer Neg Hx    Breast cancer Neg Hx     Social History   Tobacco Use   Smoking status: Never   Smokeless tobacco: Never  Vaping Use   Vaping Use: Never used  Substance Use Topics   Alcohol use: Yes    Comment: 1 a week   Drug use: Never    ROS  Objective:   Vitals: BP 108/76 (BP Location: Left Arm)   Pulse 83   Temp 98.3 F (36.8 C) (Oral)   Resp 18   SpO2 93%   Physical Exam Constitutional:      General: She is not in acute distress.    Appearance: Normal appearance. She is well-developed. She is not ill-appearing, toxic-appearing or diaphoretic.  HENT:     Head: Normocephalic and atraumatic.     Nose: Nose normal.     Mouth/Throat:     Mouth: Mucous membranes are moist.     Pharynx: Oropharynx is clear.  Eyes:     General: No scleral icterus.       Right eye: No discharge.        Left eye: No discharge.     Extraocular Movements: Extraocular movements intact.     Conjunctiva/sclera: Conjunctivae normal.     Pupils: Pupils are equal, round, and reactive to light.  Cardiovascular:     Rate and Rhythm: Normal rate.   Pulmonary:     Effort: Pulmonary effort is normal.  Skin:    General: Skin is warm and dry.     Findings: Rash (multiple solitary macular lesions, urticarial type lesions over chest extending to either side of her lateral shoulders, to a lesser degree over her lower legs) present.  Neurological:     General: No focal deficit present.     Mental Status: She is alert and oriented to person, place, and time.  Psychiatric:        Mood and Affect: Mood normal.        Behavior: Behavior normal.        Thought Content: Thought content normal.        Judgment: Judgment normal.     Assessment and Plan :   PDMP not reviewed this encounter.  1. Irritant dermatitis     Patient has exposure to some offending agent at her father's home.  Emphasized need to have that evaluated and removed.  In the meantime use conservative management including betamethasone ointment, hydroxyzine. Counseled patient on potential for adverse effects with medications prescribed/recommended today, ER and return-to-clinic precautions discussed, patient verbalized understanding.    Jaynee Eagles, PA-C 03/01/21 1505

## 2021-04-03 IMAGING — US US BREAST*R* LIMITED INC AXILLA
1 series · 13 of 16 positions shown · non-contrast
Comparison: Previous exam(s).

CLINICAL DATA: Screening recall for a possible right breast mass.

EXAM:
DIGITAL DIAGNOSTIC UNILATERAL RIGHT MAMMOGRAM WITH TOMO AND CAD;
ULTRASOUND RIGHT BREAST LIMITED

[Series 1: us breast*right* limited inc axilla · 0.06mm/px · 13 of 16 slices shown]
[im 1/16]
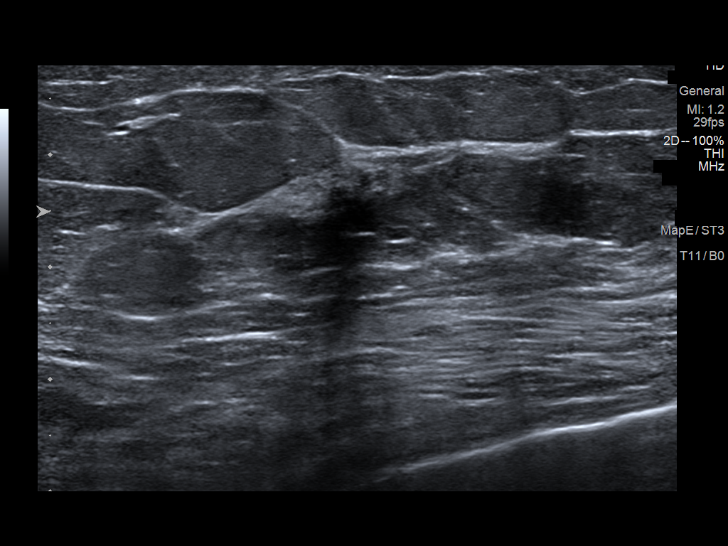
[im 2/16]
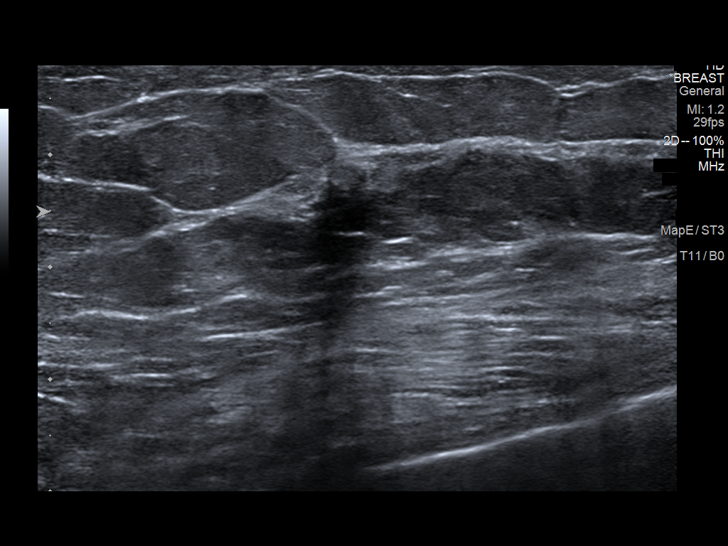
[im 4/16]
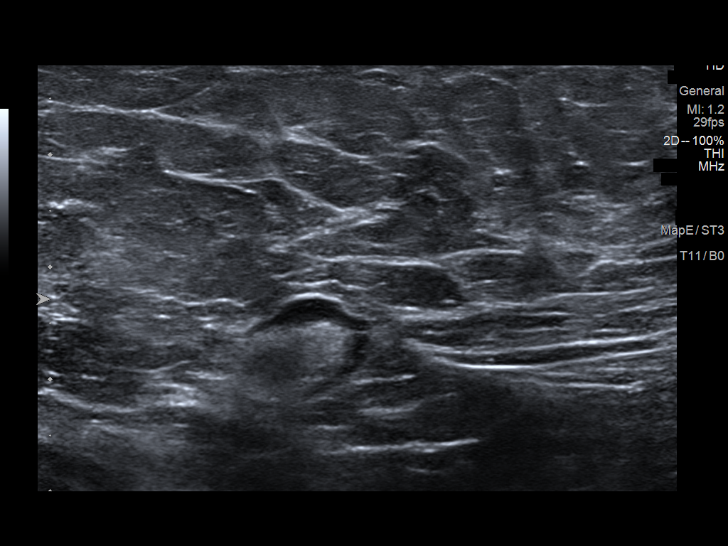
[im 5/16]
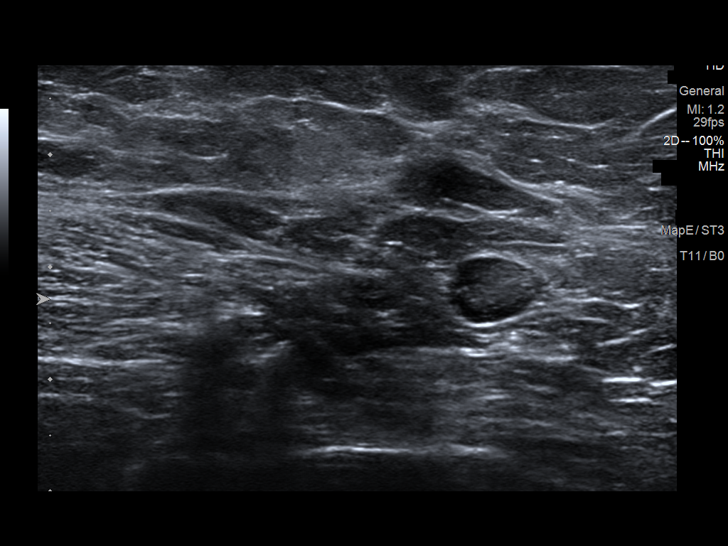
[im 6/16]
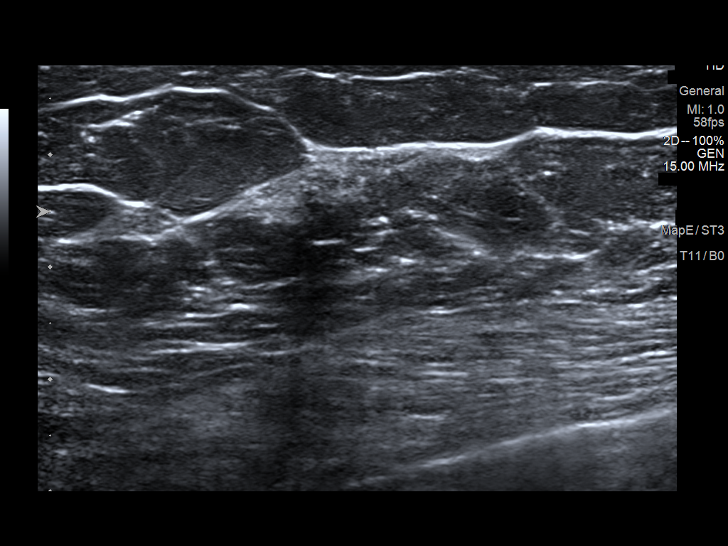
[im 7/16]
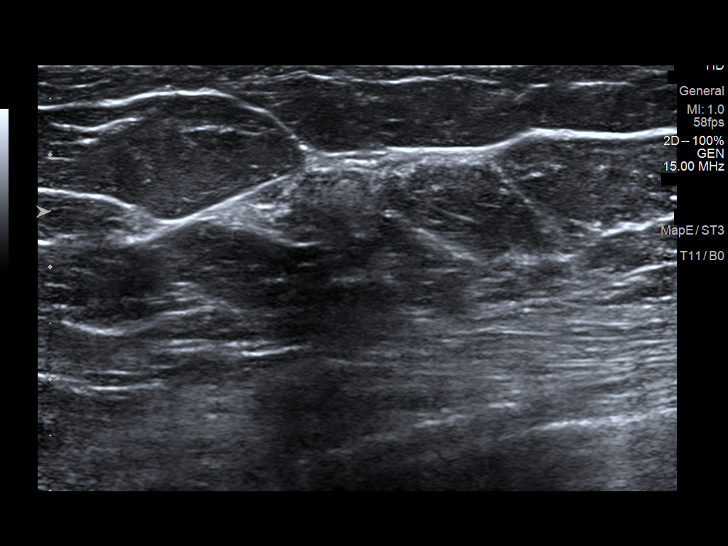
[im 9/16]
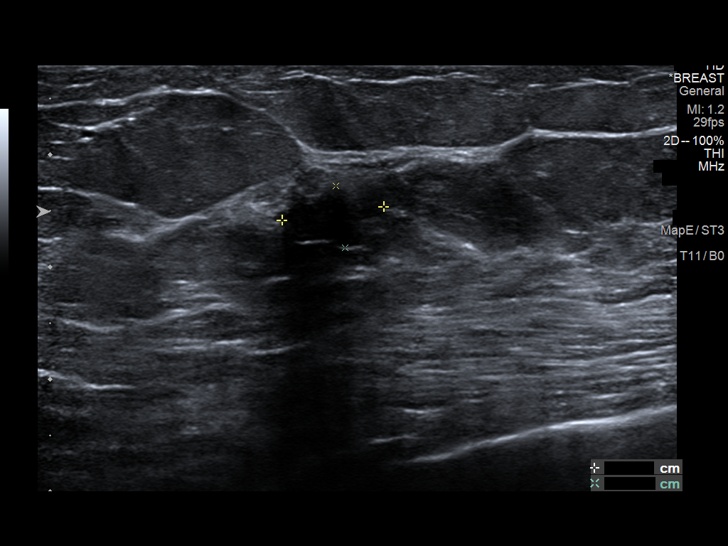
[im 10/16]
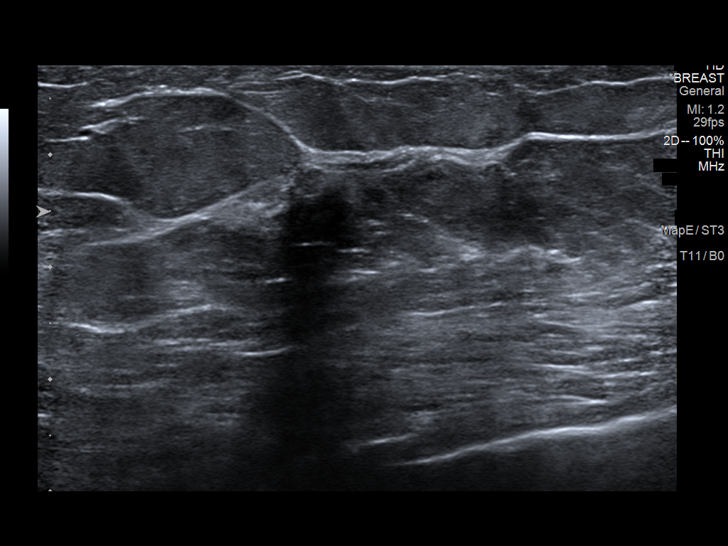
[im 11/16]
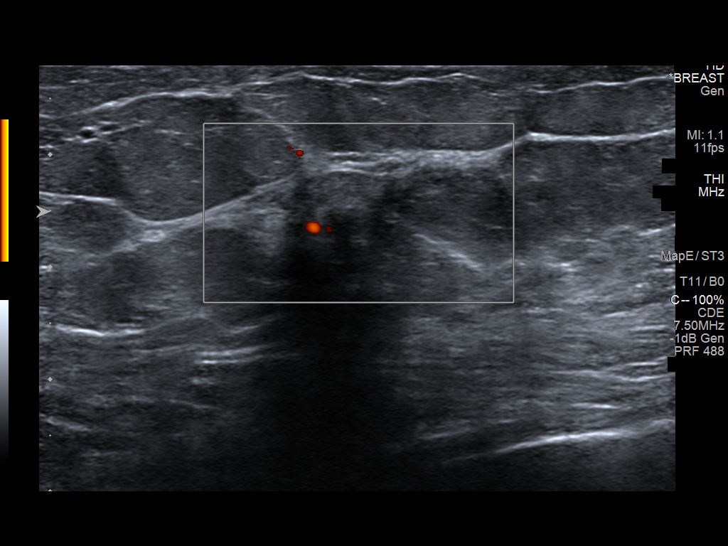
[im 12/16]
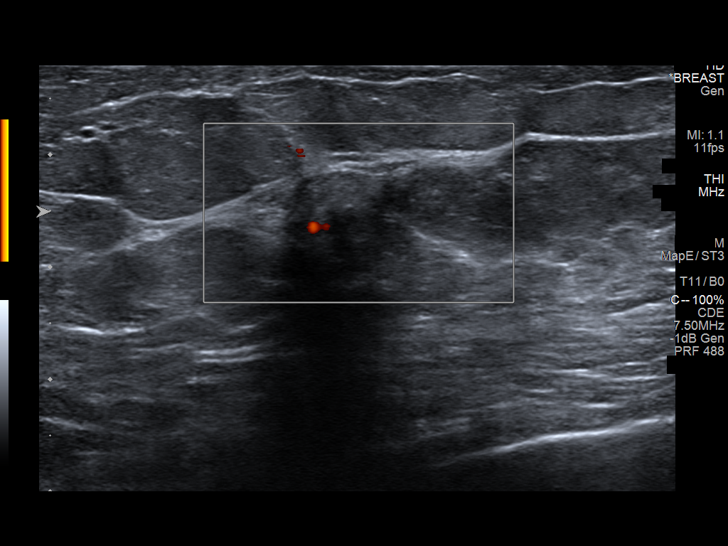
[im 13/16]
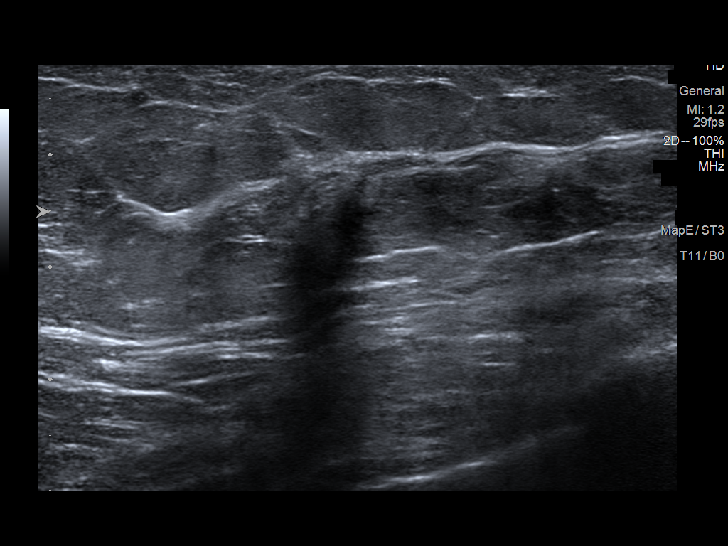
[im 15/16]
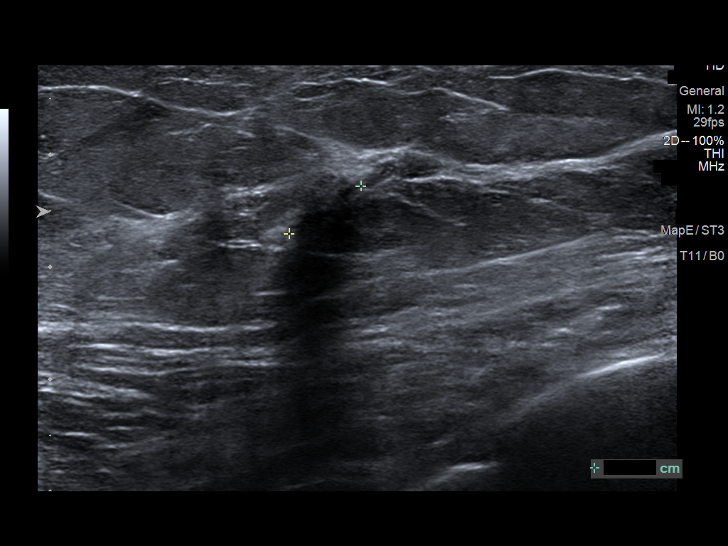
[im 16/16]
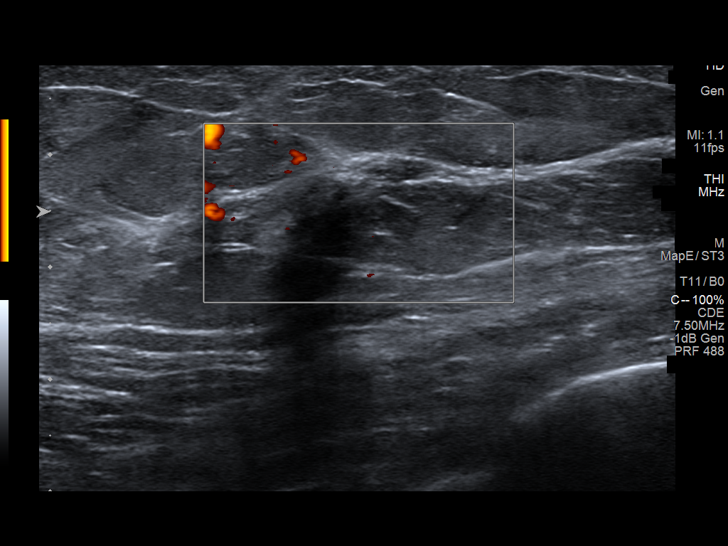

[13 of 16 positions shown; findings below may reference images not displayed]

ACR Breast Density Category b: There are scattered areas of
fibroglandular density.
FINDINGS: Spot compression tomosynthesis images through the upper inner right
breast demonstrates a persistent irregular asymmetry measuring 6 mm.

Mammographic images were processed with CAD.

Ultrasound targeted to the right breast at 1 o'clock, 9 cm from the
nipple demonstrates an irregular hypoechoic shadowing mass measuring
9 x 6 x 7 mm. The mass is only well visualized with harmonics
imaging. Ultrasound of the right axilla demonstrates multiple
normal-appearing lymph nodes.
IMPRESSION: 1. There is an indeterminate 9 mm mass in the right breast at 1
o'clock.

2.  No evidence of right axillary lymphadenopathy.

RECOMMENDATION:
Ultrasound guided biopsy is recommended for the right breast mass at
1 o'clock. This has been scheduled for 03/05/2020 at [DATE] a.m.
Close attention on post biopsy mammogram is recommended to ensure
that the clip placed is at the site of the mammographic asymmetry.
If it is not, subsequent stereotactic biopsy would be recommended.

I have discussed the findings and recommendations with the patient.
If applicable, a reminder letter will be sent to the patient
regarding the next appointment.

BI-RADS CATEGORY  4: Suspicious.

## 2021-04-07 ENCOUNTER — Other Ambulatory Visit: Payer: Self-pay

## 2021-04-07 ENCOUNTER — Ambulatory Visit (INDEPENDENT_AMBULATORY_CARE_PROVIDER_SITE_OTHER): Payer: Medicare Other

## 2021-04-07 ENCOUNTER — Other Ambulatory Visit: Payer: Self-pay | Admitting: Obstetrics & Gynecology

## 2021-04-07 DIAGNOSIS — M8589 Other specified disorders of bone density and structure, multiple sites: Secondary | ICD-10-CM

## 2021-04-07 DIAGNOSIS — Z780 Asymptomatic menopausal state: Secondary | ICD-10-CM | POA: Diagnosis not present

## 2021-04-16 DIAGNOSIS — Z1211 Encounter for screening for malignant neoplasm of colon: Secondary | ICD-10-CM | POA: Diagnosis not present

## 2021-04-16 DIAGNOSIS — D123 Benign neoplasm of transverse colon: Secondary | ICD-10-CM | POA: Diagnosis not present

## 2021-04-21 DIAGNOSIS — D123 Benign neoplasm of transverse colon: Secondary | ICD-10-CM | POA: Diagnosis not present

## 2021-04-23 ENCOUNTER — Other Ambulatory Visit: Payer: Self-pay | Admitting: Cardiovascular Disease

## 2021-05-20 ENCOUNTER — Other Ambulatory Visit: Payer: Self-pay

## 2021-05-20 ENCOUNTER — Ambulatory Visit: Payer: Medicare Other | Admitting: Cardiovascular Disease

## 2021-05-20 ENCOUNTER — Encounter: Payer: Self-pay | Admitting: Cardiovascular Disease

## 2021-05-20 VITALS — BP 98/60 | HR 76 | Ht 68.5 in | Wt 189.0 lb

## 2021-05-20 DIAGNOSIS — R7303 Prediabetes: Secondary | ICD-10-CM

## 2021-05-20 DIAGNOSIS — E7800 Pure hypercholesterolemia, unspecified: Secondary | ICD-10-CM

## 2021-05-20 DIAGNOSIS — I2584 Coronary atherosclerosis due to calcified coronary lesion: Secondary | ICD-10-CM

## 2021-05-20 DIAGNOSIS — I2510 Atherosclerotic heart disease of native coronary artery without angina pectoris: Secondary | ICD-10-CM

## 2021-05-20 DIAGNOSIS — E782 Mixed hyperlipidemia: Secondary | ICD-10-CM

## 2021-05-20 DIAGNOSIS — R911 Solitary pulmonary nodule: Secondary | ICD-10-CM | POA: Diagnosis not present

## 2021-05-20 NOTE — Progress Notes (Signed)
Cardiology Office Note    Date:  05/27/2021   ID:  Dominique Lane, DOB [birth year], MRN [id number]  PCP:  Hoyt Koch, MD  Cardiologist:  Shelva Majestic, MD   12 month F/U evaluaiton  History of Present Illness:  Dominique Lane is a 60 y.o. female who was  evaluated by Dr. Percival Spanish in June 2021 for evaluation of chest pain referred through Dr. Pricilla Holm. The patient's chest pain was dull coming and going over several days duration and often would occur at rest. It was unassociated with any nausea vomiting or diaphoresis. She denied any exertional precipitation. She underwent an size treadmill test test on December 18, 2019 where she exercised to a 10.1-minute workload, and a normal blood pressure response to exercise and did not develop any chest pain or ECG abnormalities. She also had undergone laboratory on February 01, 2019 which had shown a total cholesterol of 273 with an LDL cholesterol of 196. On December 20, 2019 a calcium score was 76 agonist on units, 85th percentile for age and sex max control. Mild calcification was noted in the proximal LAD.  I saw her in July 2021 when she established care with me.  She denied any recent exertional chest tightness or pressure. She was recently started on atorvastatin statin 40 mg. Purposefully trying to lose weight and admits to a 15 pound weight loss since January 2021.   I saw her in follow-up in May 13, 2020.  Over the last several months, she significantly made improvement with her diet.  She underwent follow-up laboratory on February 06, 2020 which now showed a total cholesterol of 140, HDL 44, triglycerides 85, and LDL cholesterol at 79.  She has been caring for her elderly father and sharing responsibilities with several siblings for this job.  Presently, she feels well.  She is trying to increase her activity.  In August 2020 hemoglobin A1c was 6.1 and this had improved to 5.8 on February 06, 2020.  Since I last saw her, she  has remained stable.  Her lipid studies have remained stable and most recent lipid panel showed a total cholesterol now at 143 and an LDL cholesterol at 71.  She continues to be on atorvastatin 40 mg and has been compliant with her improved diet.  She continues to care for her father who just turned 26 years old.  She denies any chest pain or shortness of breath.  She has previously been noted to have a right lower lobe pulmonary nodule 4 mm.  She is a non-smoker.  She presents for evaluation.   Past Medical History:  Diagnosis Date   Hyperlipidemia    MVP (mitral valve prolapse)    Osteopenia 12/2018   T score of -1.9 FRAX 8.8% / 1.0%    Past Surgical History:  Procedure Laterality Date   CARPAL TUNNEL RELEASE Bilateral    CHOLECYSTECTOMY     COLONOSCOPY  2011   Dr Amedeo Plenty   fractured mandible  1997   repair   GANGLION CYST EXCISION     LAPAROSCOPIC LYSIS OF ADHESIONS N/A 09/13/2012   Procedure: LAPAROSCOPIC LYSIS OF ADHESIONS;  Surgeon: Terrance Mass, MD;  Location: [facility];  Service: Gynecology;  Laterality: N/A;   LAPAROSCOPY N/A 09/13/2012   Procedure: LAPAROSCOPY OPERATIVE;  Surgeon: Terrance Mass, MD;  Location: [facility];  Service: Gynecology;  Laterality: N/A;   OVARIAN CYST REMOVAL     Right   ROTATOR CUFF REPAIR  twice on left and once on right   SALPINGOOPHORECTOMY Bilateral 09/13/2012   Procedure: SALPINGO OOPHORECTOMY;  Surgeon: Terrance Mass, MD;  Location: [facility];  Service: Gynecology;  Laterality: Bilateral;   TUBAL LIGATION     VAGINAL HYSTERECTOMY      Current Medications: Outpatient Medications Prior to Visit  Medication Sig Dispense Refill   aspirin EC 81 MG tablet Take 81 mg by mouth daily. Pt takes every other day.     atorvastatin (LIPITOR) 40 MG tablet TAKE 1 TABLET BY MOUTH  DAILY 90 tablet 3   betamethasone dipropionate (DIPROLENE) 0.05 % ointment Apply topically 2 (two) times daily. 30 g 0   calcium gluconate 500 MG tablet Take 1 tablet by mouth.      Multiple Vitamin (MULTI-VITAMIN) tablet Take by mouth every other day.     pantoprazole (PROTONIX) 40 MG tablet Take 1 tablet (40 mg total) by mouth daily. (Patient taking differently: Take 40 mg by mouth daily. Pt takes every other day) 90 tablet 3   hydrOXYzine (ATARAX/VISTARIL) 25 MG tablet Take 0.5-1 tablets (12.5-25 mg total) by mouth every 8 (eight) hours as needed for itching. 30 tablet 0   No facility-administered medications prior to visit.     Allergies:   Cephalosporins, Naproxen sodium, and Hydromet [hydrocodone bit-homatrop mbr]   Social History   Socioeconomic History   Marital status: Divorced    Spouse name: Not on file   Number of children: 2   Years of education: Not on file   Highest education level: Not on file  Occupational History   Occupation: [employer], production line  Tobacco Use   Smoking status: Never   Smokeless tobacco: Never  Vaping Use   Vaping Use: Never used  Substance and Sexual Activity   Alcohol use: Yes    Comment: 1 a week   Drug use: Never   Sexual activity: Not Currently    Birth control/protection: Surgical    Comment: hysterectomy  Other Topics Concern   Not on file  Social History Narrative   Lives alone.     Social Determinants of Health   Financial Resource Strain: Not on file  Food Insecurity: Not on file  Transportation Needs: Not on file  Physical Activity: Not on file  Stress: Not on file  Social Connections: Not on file     Family History:  The patient's family history includes Bladder Cancer (age of onset: 76) in her father; Heart disease (age of onset: 52) in her mother; Hypertension in her sister; Leukemia in her paternal grandfather; Lymphoma in her paternal aunt; Ovarian cancer (age of onset: 72) in her sister.   ROS General: Negative; No fevers, chills, or night sweats;  HEENT: Negative; No changes in vision or hearing, sinus congestion, difficulty swallowing Pulmonary: Negative; No cough, wheezing,  shortness of breath, hemoptysis Cardiovascular: Negative; No chest pain, presyncope, syncope, palpitations GI: Negative; No nausea, vomiting, diarrhea, or abdominal pain GU: Negative; No dysuria, hematuria, or difficulty voiding Musculoskeletal: Negative; no myalgias, joint pain, or weakness Hematologic/Oncology: Negative; no easy bruising, bleeding Endocrine: Negative; no heat/cold intolerance; no diabetes Neuro: Negative; no changes in balance, headaches Skin: Negative; No rashes or skin lesions Psychiatric: Negative; No behavioral problems, depression Sleep: Negative; No snoring, daytime sleepiness, hypersomnolence, bruxism, restless legs, hypnogognic hallucinations, no cataplexy Other comprehensive 14 point system review is negative.   PHYSICAL EXAM:   VS:  BP 98/60 (BP Location: Left Arm, Patient Position: Sitting, Cuff Size: Large)   Pulse 76  Ht 5' 8.5" (1.74 m)   Wt 189 lb (85.7 kg)   BMI 28.32 kg/m     Repeat blood pressure by me was 104/60  Wt Readings from Last 3 Encounters:  05/20/21 189 lb (85.7 kg)  02/20/21 199 lb (90.3 kg)  02/09/21 198 lb 6.4 oz (90 kg)    General: Alert, oriented, no distress.  Skin: normal turgor, no rashes, warm and dry HEENT: Normocephalic, atraumatic. Pupils equal round and reactive to light; sclera anicteric; extraocular muscles intact; Fundi ** Nose without nasal septal hypertrophy Mouth/Parynx benign; Mallinpatti scale 3 Neck: No JVD, no carotid bruits; normal carotid upstroke Lungs: clear to ausculatation and percussion; no wheezing or rales Chest wall: without tenderness to palpitation Heart: PMI not displaced, RRR, s1 s2 normal, 1/6 systolic murmur, no diastolic murmur, no rubs, gallops, thrills, or heaves Abdomen: soft, nontender; no hepatosplenomehaly, BS+; abdominal aorta nontender and not dilated by palpation. Back: no CVA tenderness Pulses 2+ Musculoskeletal: full range of motion, normal strength, no joint  deformities Extremities: no clubbing cyanosis or edema, Homan's sign negative  Neurologic: grossly nonfocal; Cranial nerves grossly wnl Psychologic: Normal mood and affect   Studies/Labs Reviewed:   EKG:  EKG is ordered today.  ECG (independently read by me): NSR at 76; no ectopy, normal intervals  May 13, 2021 ECG (independently read by me): NSR at 81, no ectopy, normal intervals  July 2021 ECG (independently read by me): NSR at 66; no ectopy; normal intervals  Recent Labs: BMP Latest Ref Rng & Units 02/09/2021 02/06/2020 02/01/2019  Glucose 70 - 99 mg/dL 74 92 88  BUN 6 - 23 mg/dL $Remove'13 18 16  'MSyPckq$ Creatinine 0.40 - 1.20 mg/dL 0.77 0.82 0.82  BUN/Creat Ratio 6 - 22 (calc) - NOT APPLICABLE -  Sodium 212 - 145 mEq/L 140 139 138  Potassium 3.5 - 5.1 mEq/L 3.6 4.3 4.0  Chloride 96 - 112 mEq/L 104 106 105  CO2 19 - 32 mEq/L $Remove'28 25 25  'EwSqUOy$ Calcium 8.4 - 10.5 mg/dL 9.3 9.4 9.4     Hepatic Function Latest Ref Rng & Units 02/09/2021 02/06/2020 02/01/2019  Total Protein 6.0 - 8.3 g/dL 7.4 6.9 7.4  Albumin 3.5 - 5.2 g/dL 4.5 - 4.5  AST 0 - 37 U/L $Remo'25 19 19  'FujQv$ ALT 0 - 35 U/L $Remo'24 18 18  'VVAky$ Alk Phosphatase 39 - 117 U/L 107 - 106  Total Bilirubin 0.2 - 1.2 mg/dL 0.7 0.5 0.5  Bilirubin, Direct 0.0 - 0.3 mg/dL - - -    CBC Latest Ref Rng & Units 02/09/2021 02/06/2020 02/01/2019  WBC 4.0 - 10.5 K/uL 5.9 5.9 6.1  Hemoglobin 12.0 - 15.0 g/dL 13.2 13.1 13.4  Hematocrit 36.0 - 46.0 % 40.2 38.9 40.3  Platelets 150.0 - 400.0 K/uL 212.0 225 233.0   Lab Results  Component Value Date   MCV 92.2 02/09/2021   MCV 89.2 02/06/2020   MCV 88.9 02/01/2019   Lab Results  Component Value Date   TSH 1.71 01/28/2014   Lab Results  Component Value Date   HGBA1C 5.9 02/09/2021     BNP No results found for: BNP  ProBNP    Component Value Date/Time   PROBNP 25.0 09/26/2008 1448     Lipid Panel     Component Value Date/Time   CHOL 143 02/09/2021 1325   TRIG 93.0 02/09/2021 1325   HDL 54.00 02/09/2021 1325    CHOLHDL 3 02/09/2021 1325   VLDL 18.6 02/09/2021 1325   LDLCALC 71 02/09/2021 1325  LDLCALC 79 02/06/2020 1032   LDLDIRECT 170.0 08/08/2017 1033     RADIOLOGY: No results found.   Additional studies/ records that were reviewed today include:  Recent laboratory done on February 06, 2020 by Dr. Belenda Cruise  Laboratory from February 09, 2021 was reviewed.  ASSESSMENT:    1. Pure hypercholesterolemia   2. Coronary artery calcification   3. Lung nodule   4. Prediabetes     PLAN:  Ms. Mersadies Petree is a 60 year old female who remotely developed chest pain with some atypical features. Her exercise treadmill test was negative for ischemia and she was able to achieve a 10.1 met workload. She has a history of significant hyperlipidemia raising concern for possible familial hyperlipidemia particularly with LDL at 196 in August 2020. She was found to have a cardiac calcium score of 65 representing 85th percentile for age and sex matched control. Calcium was noted in the proximal LAD.  When I saw her in July 2021 when she reestablished care with me after not having seen her in over 12 years, I felt her recent chest pain was atypical for ischemic etiology.  She was just started on atorvastatin 40 mg and I recommended target LDL ideally less than 70 in an attempt to induce plaque regression plaque stability.  She underwent follow-up laboratory on February 06, 2020 by Dr. Sharlet Salina which demonstrated significant improvement with total cholesterol now 140, HDL 44, triglycerides 85 and LDL 79.  Since I saw her 1 year ago, she has continued to be active and continues to take care of her father who just turned 29 years old.  She continues to follow the diet which we emphasized on her prior evaluations.  Most recent lipid studies continue to show lipid stability with total cholesterol 143, triglycerides 93, HDL 54 (improved from 44) and LDL cholesterol now at 71 markedly improved from 196 in August 2020.  Her blood  pressure today is stable.  She is a non-smoker and on her coronary calcium score CT, a 4 mm nodule was noted.  She has been felt to be low risk but future noncontrast chest CT may be worthwhile.  She continues to be on pantoprazole for GERD which is controlled.  Recent hemoglobin A1c was 5.9 consistent with early prediabetes.  She will continue current therapy.  I will see her in 1 year for reevaluation or sooner as needed.    Medication Adjustments/Labs and Tests Ordered: Current medicines are reviewed at length with the patient today.  Concerns regarding medicines are outlined above.  Medication changes, Labs and Tests ordered today are listed in the Patient Instructions below. Patient Instructions  Medication Instructions:  Your Physician recommend you continue on your current medication as directed.    *If you need a refill on your cardiac medications before your next appointment, please call your pharmacy*   Lab Work: None   Testing/Procedures: None    Follow-Up: At [facility], you and your health needs are our priority.  As part of our continuing mission to provide you with exceptional heart care, we have created designated Provider Care Teams.  These Care Teams include your primary Cardiologist (physician) and Advanced Practice Providers (APPs -  Physician Assistants and Nurse Practitioners) who all work together to provide you with the care you need, when you need it.  We recommend signing up for the patient portal called "MyChart".  Sign up information is provided on this After Visit Summary.  MyChart is used to connect with patients for Virtual Visits (  Telemedicine).  Patients are able to view lab/test results, encounter notes, upcoming appointments, etc.  Non-urgent messages can be sent to your provider as well.   To learn more about what you can do with MyChart, go to [email or web address].    Your next appointment:   12 month(s)  The format for your next appointment:    In Person  Provider:   Dr. Corky Downs, MD      Signed, Shelva Majestic, MD  05/27/2021 10:30 AM    [facility] [address], [place], [place], [place]  [address] Phone: [phone]

## 2021-05-20 NOTE — Patient Instructions (Signed)
Medication Instructions:  Your Physician recommend you continue on your current medication as directed.    *If you need a refill on your cardiac medications before your next appointment, please call your pharmacy*   Lab Work: None   Testing/Procedures: None    Follow-Up: At [facility], you and your health needs are our priority.  As part of our continuing mission to provide you with exceptional heart care, we have created designated Provider Care Teams.  These Care Teams include your primary Cardiologist (physician) and Advanced Practice Providers (APPs -  Physician Assistants and Nurse Practitioners) who all work together to provide you with the care you need, when you need it.  We recommend signing up for the patient portal called "MyChart".  Sign up information is provided on this After Visit Summary.  MyChart is used to connect with patients for Virtual Visits (Telemedicine).  Patients are able to view lab/test results, encounter notes, upcoming appointments, etc.  Non-urgent messages can be sent to your provider as well.   To learn more about what you can do with MyChart, go to [email or web address].    Your next appointment:   12 month(s)  The format for your next appointment:   In Person  Provider:   Dr. Corky Downs, MD

## 2021-05-27 ENCOUNTER — Encounter: Payer: Self-pay | Admitting: Cardiovascular Disease

## 2021-09-14 ENCOUNTER — Ambulatory Visit
Admission: EM | Admit: 2021-09-14 | Discharge: 2021-09-14 | Disposition: A | Discharge status: Home / Self Care | Payer: Medicare Other | Attending: Internal Medicine | Admitting: Internal Medicine

## 2021-09-14 ENCOUNTER — Encounter: Payer: Self-pay | Admitting: Emergency Medicine

## 2021-09-14 ENCOUNTER — Ambulatory Visit
Admission: EM | Admit: 2021-09-14 | Discharge: 2021-09-14 | Discharge status: Against Medical Advice | Payer: Medicare Other

## 2021-09-14 ENCOUNTER — Telehealth: Payer: Self-pay | Admitting: Emergency Medicine

## 2021-09-14 ENCOUNTER — Other Ambulatory Visit: Payer: Self-pay

## 2021-09-14 DIAGNOSIS — L03113 Cellulitis of right upper limb: Secondary | ICD-10-CM

## 2021-09-14 MED ORDER — PREDNISONE 10 MG (21) PO TBPK: ORAL_TABLET | Freq: Every day | ORAL | 0 refills | Status: DC

## 2021-09-14 MED ORDER — DOXYCYCLINE HYCLATE 100 MG PO CAPS: 100.0000 mg | ORAL_CAPSULE | Freq: Two times a day (BID) | ORAL | 0 refills | Status: DC

## 2021-09-14 MED ORDER — METHYLPREDNISOLONE SODIUM SUCC 125 MG IJ SOLR
80.0000 mg | Freq: Once | INTRAMUSCULAR | Status: AC
  Administered 2021-09-14: 80 mg via INTRAMUSCULAR

## 2021-09-14 NOTE — ED Triage Notes (Signed)
Patient c/o poison ivy on both arms, right arm is worse for several days.  Patient feels that she has a "fever" in her arm, the area is red and itchy.  Patient has applied Zanfel, Chlamine lotion w/o much relief. ?

## 2021-09-14 NOTE — Discharge Instructions (Signed)
It appears that you have an infection of your right arm.  You are being treated with an antibiotic and prednisone steroid pills.  A steroid injection was administered in urgent care today.  Please do not start prednisone steroid until tomorrow.  Please go to the hospital if symptoms persist or worsen. ?

## 2021-09-14 NOTE — ED Provider Notes (Addendum)
[name]    CSN: [id number] Arrival date & time: 09/14/21  1746      History   Chief Complaint Chief Complaint  Patient presents with   Rash    HPI Dominique Lane is a 61 y.o. female.   Patient presents with possible allergic reaction to poison ivy to bilateral arms that has been present over the past few days.  Patient reports that she was working outside and thinks that she was exposed to poison ivy.  Patient has been using several different creams including calamine lotion with minimal improvement in symptoms.  She is concerned for infection to the right arm due to increased redness and swelling over the past few days.  Denies fevers, body aches, chills.   Rash  Past Medical History:  Diagnosis Date   Hyperlipidemia    MVP (mitral valve prolapse)    Osteopenia 12/2018   T score of -1.9 FRAX 8.8% / 1.0%    Patient Active Problem List   Diagnosis Date Noted   Aortic atherosclerosis (HCC) 02/09/2021   Supraclavicular fossa fullness 12/18/2016   Annual physical exam 12/05/2014   Prediabetes 06/12/2014   MRSA carrier 09/08/2012   HYPERLIPIDEMIA 04/10/2007   GERD (gastroesophageal reflux disease) 04/10/2007    Past Surgical History:  Procedure Laterality Date   CARPAL TUNNEL RELEASE Bilateral    CHOLECYSTECTOMY     COLONOSCOPY  2011   Dr Madilyn Fireman   fractured mandible  1997   repair   GANGLION CYST EXCISION     LAPAROSCOPIC LYSIS OF ADHESIONS N/A 09/13/2012   Procedure: LAPAROSCOPIC LYSIS OF ADHESIONS;  Surgeon: [name], MD;  Location: [facility];  Service: Gynecology;  Laterality: N/A;   LAPAROSCOPY N/A 09/13/2012   Procedure: LAPAROSCOPY OPERATIVE;  Surgeon: [name], MD;  Location: [facility];  Service: Gynecology;  Laterality: N/A;   OVARIAN CYST REMOVAL     Right   ROTATOR CUFF REPAIR     twice on left and once on right   SALPINGOOPHORECTOMY Bilateral 09/13/2012   Procedure: SALPINGO OOPHORECTOMY;  Surgeon: [name], MD;   Location: [facility];  Service: Gynecology;  Laterality: Bilateral;   TUBAL LIGATION     VAGINAL HYSTERECTOMY      OB History     Gravida  2   Para  2   Term  2   Preterm      AB      Living  2      SAB      IAB      Ectopic      Multiple      Live Births               Home Medications    Prior to Admission medications   Medication Sig Start Date End Date Taking? Authorizing Provider  aspirin EC 81 MG tablet Take 81 mg by mouth daily. Pt takes every other day.   Yes [provider]  atorvastatin (LIPITOR) 40 MG tablet TAKE 1 TABLET BY MOUTH  DAILY 04/23/21  Yes Lennette Bihari, MD  calcium gluconate 500 MG tablet Take 1 tablet by mouth.   Yes [provider]  doxycycline (VIBRAMYCIN) 100 MG capsule Take 1 capsule (100 mg total) by mouth 2 (two) times daily. 09/14/21  Yes Jahden Schara, Rolly Salter E, FNP  Multiple Vitamin (MULTI-VITAMIN) tablet Take by mouth every other day.   Yes [provider]  pantoprazole (PROTONIX) 40 MG tablet Take 1 tablet (40 mg  total) by mouth daily. Patient taking differently: Take 40 mg by mouth daily. Pt takes every other day 02/13/20  Yes Myrlene Broker, MD  predniSONE (STERAPRED UNI-PAK 21 TAB) 10 MG (21) TBPK tablet Take by mouth daily. Take 6 tabs by mouth daily  for 2 days, then 5 tabs for 2 days, then 4 tabs for 2 days, then 3 tabs for 2 days, 2 tabs for 2 days, then 1 tab by mouth daily for 2 days 09/14/21  Yes Vinita Prentiss, Lamont E, FNP  betamethasone dipropionate (DIPROLENE) 0.05 % ointment Apply topically 2 (two) times daily. 03/01/21   Wallis Bamberg, PA-C    Family History Family History  Problem Relation Age of Onset   Heart disease Mother 74       Sudden death.    Bladder Cancer Father 77   Lymphoma Paternal Aunt    Hypertension Sister    Ovarian cancer Sister 40   Leukemia Paternal Grandfather    CAD Neg Hx    Colon cancer Neg Hx    Breast cancer Neg Hx     Social History Social History   Tobacco Use    Smoking status: Never   Smokeless tobacco: Never  Vaping Use   Vaping Use: Never used  Substance Use Topics   Alcohol use: Yes    Comment: 1 a week   Drug use: Never     Allergies   Cephalosporins, Naproxen sodium, and Hydromet [hydrocodone bit-homatrop mbr]   Review of Systems Review of Systems Per HPI  Physical Exam Triage Vital Signs ED Triage Vitals  Enc Vitals Group     BP 09/14/21 1830 (!) 151/101     Pulse Rate 09/14/21 1830 72     Resp 09/14/21 1830 18     Temp 09/14/21 1830 98.5 F (36.9 C)     Temp Source 09/14/21 1830 Oral     SpO2 09/14/21 1830 96 %     Weight 09/14/21 1831 188 lb 15 oz (85.7 kg)     Height 09/14/21 1831 5' 8.5" (1.74 m)     Head Circumference --      Peak Flow --      Pain Score 09/14/21 1831 6     Pain Loc --      Pain Edu? --      Excl. in [place]? --    No data found.  Updated Vital Signs BP (!) 151/101 (BP Location: Left Arm)   Pulse 72   Temp 98.5 F (36.9 C) (Oral)   Resp 18   Ht 5' 8.5" (1.74 m)   Wt 188 lb 15 oz (85.7 kg)   SpO2 96%   BMI 28.31 kg/m   Visual Acuity Right Eye Distance:   Left Eye Distance:   Bilateral Distance:    Right Eye Near:   Left Eye Near:    Bilateral Near:     Physical Exam Constitutional:      General: She is not in acute distress.    Appearance: Normal appearance. She is not toxic-appearing or diaphoretic.  HENT:     Head: Normocephalic and atraumatic.  Eyes:     Extraocular Movements: Extraocular movements intact.     Conjunctiva/sclera: Conjunctivae normal.  Pulmonary:     Effort: Pulmonary effort is normal.  Skin:    Comments: Erythematous rash that is scaly in nature present to right upper arm, right anterior elbow, and slightly into right forearm.  There is associated swelling located there as well.  Patient  has full range of motion of arm.  Neurovascular intact.  Mild patch of erythema located to anterior elbow to left arm with no associated swelling.  Neurological:      General: No focal deficit present.     Mental Status: She is alert and oriented to person, place, and time. Mental status is at baseline.  Psychiatric:        Mood and Affect: Mood normal.        Behavior: Behavior normal.        Thought Content: Thought content normal.        Judgment: Judgment normal.     UC Treatments / Results  Labs (all labs ordered are listed, but only abnormal results are displayed) Labs Reviewed - No data to display  EKG   Radiology No results found.  Procedures Procedures (including critical care time)  Medications Ordered in UC Medications  methylPREDNISolone sodium succinate (SOLU-MEDROL) 125 mg/2 mL injection 80 mg (80 mg Intramuscular Given 09/14/21 1846)    Initial Impression / Assessment and Plan / UC Course  I have reviewed the triage vital signs and the nursing notes.  Pertinent labs & imaging results that were available during my care of the patient were reviewed by me and considered in my medical decision making (see chart for details).     Right arm rash appears to be infected and associated with cellulitis.  It does appear the patient had allergic contact dermatitis that is now causing cellulitis.  Will treat with doxycycline antibiotic given patient's cephalosporin allergy.  Due to swelling and infection that is present, IM methylprednisone was administered in urgent care.  Prednisone steroid taper was also prescribed to patient but patient was advised to start this tomorrow given that injection was administered today.  Patient reports that she has tolerated steroids in the past.  I do think that since there is no fever or systemic symptoms involved that outpatient treatment is safe at this time.  Patient was advised to go to the ER if no improvement in symptoms in the next 24 to 48 hours or if they worsen.  Patient verbalized understanding and was agreeable with plan. Final Clinical Impressions(s) / UC Diagnoses   Final diagnoses:   Cellulitis of right arm     Discharge Instructions      It appears that you have an infection of your right arm.  You are being treated with an antibiotic and prednisone steroid pills.  A steroid injection was administered in urgent care today.  Please do not start prednisone steroid until tomorrow.  Please go to the hospital if symptoms persist or worsen.    ED Prescriptions     Medication Sig Dispense Auth. Provider   doxycycline (VIBRAMYCIN) 100 MG capsule Take 1 capsule (100 mg total) by mouth 2 (two) times daily. 20 capsule [place], [place] E, [place]   predniSONE (STERAPRED UNI-PAK 21 TAB) 10 MG (21) TBPK tablet Take by mouth daily. Take 6 tabs by mouth daily  for 2 days, then 5 tabs for 2 days, then 4 tabs for 2 days, then 3 tabs for 2 days, 2 tabs for 2 days, then 1 tab by mouth daily for 2 days 42 tablet [place], Acie Fredrickson, [place]      PDMP not reviewed this encounter.   Gustavus Bryant, [place] 09/14/21 1850    Gustavus Bryant, [place] 09/14/21 1850

## 2021-09-15 ENCOUNTER — Other Ambulatory Visit: Payer: Self-pay | Admitting: Internal Medicine

## 2021-09-15 ENCOUNTER — Other Ambulatory Visit: Payer: Self-pay | Admitting: Obstetrics & Gynecology

## 2021-09-15 ENCOUNTER — Ambulatory Visit: Payer: Medicare Other | Admitting: Internal Medicine

## 2021-09-15 DIAGNOSIS — Z9889 Other specified postprocedural states: Secondary | ICD-10-CM

## 2021-09-18 ENCOUNTER — Ambulatory Visit (INDEPENDENT_AMBULATORY_CARE_PROVIDER_SITE_OTHER): Payer: Medicare Other | Admitting: Internal Medicine

## 2021-09-18 ENCOUNTER — Other Ambulatory Visit: Payer: Self-pay

## 2021-09-18 ENCOUNTER — Encounter: Payer: Self-pay | Admitting: Internal Medicine

## 2021-09-18 DIAGNOSIS — L03113 Cellulitis of right upper limb: Secondary | ICD-10-CM | POA: Insufficient documentation

## 2021-09-18 NOTE — Assessment & Plan Note (Signed)
Improving from 3-4 days ago at urgent care but not gone. Needs to continue doxycycline 100 mg BID for another 1 week/until gone. Also continue prednisone dose pack as this started with contact dermatitis.  ?

## 2021-09-18 NOTE — Progress Notes (Signed)
? ?  Subjective:  ? ?Patient ID: Dominique Lane, female    DOB: 28-Jun-1961, 61 y.o.   MRN: [id number] ? ?HPI ?The patient is a 61 YO coming in for possible poison ivy.  ? ?Review of Systems  ?Constitutional: Negative.   ?HENT: Negative.    ?Eyes: Negative.   ?Respiratory:  Negative for cough, chest tightness and shortness of breath.   ?Cardiovascular:  Negative for chest pain, palpitations and leg swelling.  ?Gastrointestinal:  Negative for abdominal distention, abdominal pain, constipation, diarrhea, nausea and vomiting.  ?Musculoskeletal: Negative.   ?Skin:  Positive for color change, rash and wound.  ?Neurological: Negative.   ?Psychiatric/Behavioral: Negative.    ? ?Objective:  ?Physical Exam ?Constitutional:   ?   Appearance: She is well-developed.  ?HENT:  ?   Head: Normocephalic and atraumatic.  ?Cardiovascular:  ?   Rate and Rhythm: Normal rate and regular rhythm.  ?Pulmonary:  ?   Effort: Pulmonary effort is normal. No respiratory distress.  ?   Breath sounds: Normal breath sounds. No wheezing or rales.  ?Abdominal:  ?   General: Bowel sounds are normal. There is no distension.  ?   Palpations: Abdomen is soft.  ?   Tenderness: There is no abdominal tenderness. There is no rebound.  ?Musculoskeletal:  ?   Cervical back: Normal range of motion.  ?   Comments: Right antecubital fossa with redness and skin breakdown some scabbing minimal swelling, left antecubital fossa with small red/scabbed area no swelling  ?Skin: ?   General: Skin is warm and dry.  ?Neurological:  ?   Mental Status: She is alert and oriented to person, place, and time.  ?   Coordination: Coordination normal.  ? ? ?Vitals:  ? 09/18/21 1355  ?BP: 120/78  ?Pulse: 81  ?Temp: 99.3 ?F (37.4 ?C)  ?TempSrc: Oral  ?SpO2: 95%  ?Weight: 185 lb (83.9 kg)  ?Height: 5' 8.5" (1.74 m)  ? ? ?This visit occurred during the SARS-CoV-2 public health emergency.  Safety protocols were in place, including screening questions prior to the visit, additional usage  of staff PPE, and extensive cleaning of exam room while observing appropriate contact time as indicated for disinfecting solutions.  ? ?Assessment & Plan:  ?Visit time 13 minutes in face to face communication with patient and coordination of care, additional 8 minutes spent in record review, coordination or care, ordering tests, communicating/referring to other healthcare professionals, documenting in medical records all on the same day of the visit for total time 21 minutes spent on the visit.  ? ?

## 2021-09-18 NOTE — Patient Instructions (Signed)
Keep taking the antibiotic and the steroid is gone. ?

## 2021-12-15 ENCOUNTER — Ambulatory Visit
Admission: EM | Admit: 2021-12-15 | Discharge: 2021-12-15 | Disposition: A | Discharge status: Home / Self Care | Payer: Medicare Other | Attending: Family Medicine | Admitting: Family Medicine

## 2021-12-15 DIAGNOSIS — W57XXXA Bitten or stung by nonvenomous insect and other nonvenomous arthropods, initial encounter: Secondary | ICD-10-CM | POA: Diagnosis not present

## 2021-12-15 DIAGNOSIS — S20469A Insect bite (nonvenomous) of unspecified back wall of thorax, initial encounter: Secondary | ICD-10-CM | POA: Diagnosis not present

## 2021-12-15 MED ORDER — DOXYCYCLINE HYCLATE 100 MG PO CAPS: 100.0000 mg | ORAL_CAPSULE | Freq: Two times a day (BID) | ORAL | 0 refills | Status: AC

## 2021-12-15 NOTE — Discharge Instructions (Addendum)
Take doxycycline 100 mg--1 capsule 2 times daily for 7 days  You can put hydrocortisone cream on the rash 2 times daily till better

## 2021-12-15 NOTE — ED Triage Notes (Signed)
Patient presents to Urgent Care with complaints of rash in bilateral arm creases since yesterday. Patient reports recent two tick bites and pmh of bullseye rash and systemic rash from tick bite.

## 2021-12-15 NOTE — ED Provider Notes (Signed)
[name]    CSN: [id number] Arrival date & time: 12/15/21  1559      History   Chief Complaint Chief Complaint  Patient presents with   Rash    HPI Dominique Lane is a 61 y.o. female.    Rash  Here for a rash that started in the last day or so in her antecubital spaces.  About a week ago, she found a tick and pulled off her mid back.  She has not had any fever or other rashes.  About 10 years ago she did have a tick bite and an illness from that.  Past Medical History:  Diagnosis Date   Hyperlipidemia    MVP (mitral valve prolapse)    Osteopenia 12/2018   T score of -1.9 FRAX 8.8% / 1.0%    Patient Active Problem List   Diagnosis Date Noted   Cellulitis of right arm 09/18/2021   Aortic atherosclerosis ([place]) 02/09/2021   Dermatochalasis of both upper eyelids 10/25/2019   Laryngopharyngeal reflux (LPR) 09/22/2017   Neck mass 02/08/2017   Supraclavicular fossa fullness 12/18/2016   Hematuria 08/19/2015   MRSA carrier 09/08/2012   HYPERLIPIDEMIA 04/10/2007   GERD (gastroesophageal reflux disease) 04/10/2007    Past Surgical History:  Procedure Laterality Date   CARPAL TUNNEL RELEASE Bilateral    CHOLECYSTECTOMY     COLONOSCOPY  2011   Dr Amedeo Plenty   fractured mandible  1997   repair   GANGLION CYST EXCISION     LAPAROSCOPIC LYSIS OF ADHESIONS N/A 09/13/2012   Procedure: LAPAROSCOPIC LYSIS OF ADHESIONS;  Surgeon: Terrance Mass, MD;  Location: [facility];  Service: Gynecology;  Laterality: N/A;   LAPAROSCOPY N/A 09/13/2012   Procedure: LAPAROSCOPY OPERATIVE;  Surgeon: Terrance Mass, MD;  Location: [facility];  Service: Gynecology;  Laterality: N/A;   OVARIAN CYST REMOVAL     Right   ROTATOR CUFF REPAIR     twice on left and once on right   SALPINGOOPHORECTOMY Bilateral 09/13/2012   Procedure: SALPINGO OOPHORECTOMY;  Surgeon: Terrance Mass, MD;  Location: [facility];  Service: Gynecology;  Laterality: Bilateral;   TUBAL LIGATION     VAGINAL  HYSTERECTOMY      OB History     Gravida  2   Para  2   Term  2   Preterm      AB      Living  2      SAB      IAB      Ectopic      Multiple      Live Births               Home Medications    Prior to Admission medications   Medication Sig Start Date End Date Taking? Authorizing Provider  doxycycline (VIBRAMYCIN) 100 MG capsule Take 1 capsule (100 mg total) by mouth 2 (two) times daily for 7 days. 12/15/21 12/22/21 Yes Barrett Henle, MD  aspirin EC 81 MG tablet Take 81 mg by mouth daily. Pt takes every other day.    [provider]  atorvastatin (LIPITOR) 40 MG tablet TAKE 1 TABLET BY MOUTH  DAILY 04/23/21   Troy Sine, MD  calcium gluconate 500 MG tablet Take 1 tablet by mouth.    [provider]  FLUBLOK QUADRIVALENT 0.5 ML injection  04/22/21   [provider]  Multiple Vitamin (MULTI-VITAMIN) tablet Take by mouth every other day.    [provider]  pantoprazole (PROTONIX) 40 MG tablet Take 1 tablet (40 mg total) by mouth daily. Patient taking differently: Take 40 mg by mouth daily. Pt takes every other day 02/13/20   Hoyt Koch, MD    Family History Family History  Problem Relation Age of Onset   Heart disease Mother 55       Sudden death.    Bladder Cancer Father 58   Lymphoma Paternal Aunt    Hypertension Sister    Ovarian cancer Sister 88   Leukemia Paternal Grandfather    CAD Neg Hx    Colon cancer Neg Hx    Breast cancer Neg Hx     Social History Social History   Tobacco Use   Smoking status: Never   Smokeless tobacco: Never  Vaping Use   Vaping Use: Never used  Substance Use Topics   Alcohol use: Yes    Comment: 1 a week   Drug use: Never     Allergies   Cephalosporins, Naproxen sodium, and Hydromet [hydrocodone bit-homatrop mbr]   Review of Systems Review of Systems  Skin:  Positive for rash.     Physical Exam Triage Vital Signs ED Triage Vitals  Enc Vitals  Group     BP 12/15/21 1639 137/87     Pulse Rate 12/15/21 1639 73     Resp 12/15/21 1639 18     Temp 12/15/21 1639 97.9 F (36.6 C)     Temp Source 12/15/21 1639 Oral     SpO2 12/15/21 1639 94 %     Weight --      Height --      Head Circumference --      Peak Flow --      Pain Score 12/15/21 1638 0     Pain Loc --      Pain Edu? --      Excl. in [place]? --    No data found.  Updated Vital Signs BP 137/87   Pulse 73   Temp 97.9 F (36.6 C) (Oral)   Resp 18   SpO2 94%   Visual Acuity Right Eye Distance:   Left Eye Distance:   Bilateral Distance:    Right Eye Near:   Left Eye Near:    Bilateral Near:     Physical Exam Vitals reviewed.  Constitutional:      General: She is not in acute distress.    Appearance: She is normal weight. She is not ill-appearing, toxic-appearing or diaphoretic.  Cardiovascular:     Rate and Rhythm: Normal rate and regular rhythm.     Heart sounds: No murmur heard. Pulmonary:     Effort: Pulmonary effort is normal.     Breath sounds: Normal breath sounds.  Skin:    Coloration: Skin is not jaundiced or pale.     Findings: Rash (There is faintly pink rash in the antecubital spaces.  No induration) present.     Comments: There is a area of pink induration with a central eschar about 1 cm in diameter on the mid back.  No fluctuance  Neurological:     Mental Status: She is alert and oriented to person, place, and time.  Psychiatric:        Behavior: Behavior normal.      UC Treatments / Results  Labs (all labs ordered are listed, but only abnormal results are displayed) Labs Reviewed - No data to display  EKG   Radiology No results found.  Procedures Procedures (  including critical care time)  Medications Ordered in UC Medications - No data to display  Initial Impression / Assessment and Plan / UC Course  I have reviewed the triage vital signs and the nursing notes.  Pertinent labs & imaging results that were available  during my care of the patient were reviewed by me and considered in my medical decision making (see chart for details).     We will treat with doxycycline to prevent Lyme disease. Final Clinical Impressions(s) / UC Diagnoses   Final diagnoses:  Tick bite of back wall of thorax, unspecified location, initial encounter     Discharge Instructions      Take doxycycline 100 mg--1 capsule 2 times daily for 7 days  You can put hydrocortisone cream on the rash 2 times daily till better     ED Prescriptions     Medication Sig Dispense Auth. Provider   doxycycline (VIBRAMYCIN) 100 MG capsule Take 1 capsule (100 mg total) by mouth 2 (two) times daily for 7 days. 14 capsule Windy Carina, Gwenlyn Perking, MD      PDMP not reviewed this encounter.   Barrett Henle, MD 12/15/21 [phone]

## 2022-01-09 ENCOUNTER — Other Ambulatory Visit: Payer: Self-pay | Admitting: Cardiovascular Disease

## 2022-01-26 ENCOUNTER — Encounter: Payer: Self-pay | Admitting: Podiatry

## 2022-01-26 ENCOUNTER — Ambulatory Visit: Payer: Medicare Other | Admitting: Podiatry

## 2022-01-26 DIAGNOSIS — L608 Other nail disorders: Secondary | ICD-10-CM | POA: Diagnosis not present

## 2022-01-26 DIAGNOSIS — L600 Ingrowing nail: Secondary | ICD-10-CM

## 2022-01-26 DIAGNOSIS — L603 Nail dystrophy: Secondary | ICD-10-CM | POA: Diagnosis not present

## 2022-01-26 MED ORDER — NEOMYCIN-POLYMYXIN-HC 1 % OT SOLN: OTIC | 1 refills | Status: DC

## 2022-01-26 NOTE — Progress Notes (Signed)
Subjective:  Patient ID: Dominique Lane, female    DOB: 07-Jun-1961,  MRN: [id number] HPI Chief Complaint  Patient presents with   Toe Pain    Hallux left - medial border, tender x 6 months, nail is darkening, doesn't think its ingrown   New Patient (Initial Visit)    61 y.o. female presents with the above complaint.   ROS: Denies fever chills nausea vomiting muscle aches pains calf pain back pain chest pain shortness of breath.  Past Medical History:  Diagnosis Date   Hyperlipidemia    MVP (mitral valve prolapse)    Osteopenia 12/2018   T score of -1.9 FRAX 8.8% / 1.0%   Past Surgical History:  Procedure Laterality Date   CARPAL TUNNEL RELEASE Bilateral    CHOLECYSTECTOMY     COLONOSCOPY  2011   Dr Amedeo Plenty   fractured mandible  1997   repair   GANGLION CYST EXCISION     LAPAROSCOPIC LYSIS OF ADHESIONS N/A 09/13/2012   Procedure: LAPAROSCOPIC LYSIS OF ADHESIONS;  Surgeon: Terrance Mass, MD;  Location: [facility];  Service: Gynecology;  Laterality: N/A;   LAPAROSCOPY N/A 09/13/2012   Procedure: LAPAROSCOPY OPERATIVE;  Surgeon: Terrance Mass, MD;  Location: [facility];  Service: Gynecology;  Laterality: N/A;   OVARIAN CYST REMOVAL     Right   ROTATOR CUFF REPAIR     twice on left and once on right   SALPINGOOPHORECTOMY Bilateral 09/13/2012   Procedure: SALPINGO OOPHORECTOMY;  Surgeon: Terrance Mass, MD;  Location: [facility];  Service: Gynecology;  Laterality: Bilateral;   TUBAL LIGATION     VAGINAL HYSTERECTOMY      Current Outpatient Medications:    NEOMYCIN-POLYMYXIN-HYDROCORTISONE (CORTISPORIN) 1 % SOLN OTIC solution, Apply 1-2 drops to toe BID after soaking, Disp: 10 mL, Rfl: 1   aspirin EC 81 MG tablet, Take 81 mg by mouth daily. Pt takes every other day., Disp: , Rfl:    atorvastatin (LIPITOR) 40 MG tablet, TAKE 1 TABLET BY MOUTH DAILY, Disp: 90 tablet, Rfl: 0   calcium gluconate 500 MG tablet, Take 1 tablet by mouth., Disp: , Rfl:    FLUBLOK QUADRIVALENT 0.5 ML  injection, , Disp: , Rfl:    Multiple Vitamin (MULTI-VITAMIN) tablet, Take by mouth every other day., Disp: , Rfl:    pantoprazole (PROTONIX) 40 MG tablet, Take 1 tablet (40 mg total) by mouth daily. (Patient taking differently: Take 40 mg by mouth daily. Pt takes every other day), Disp: 90 tablet, Rfl: 3  Allergies  Allergen Reactions   Cephalosporins Hives    All over the body.   Naproxen Sodium Anaphylaxis   Hydromet [Hydrocodone Bit-Homatrop Mbr] Itching   Review of Systems Objective:  There were no vitals filed for this visit.  General: Well developed, nourished, in no acute distress, alert and oriented x3   Dermatological: Skin is warm, dry and supple bilateral. Nails x 10 are well maintained; remaining integument appears unremarkable at this time. There are no open sores, no preulcerative lesions, no rash or signs of infection present.  Ingrown toenail sharp incurvated nail margin along the tibial border of the hallux left with discoloration.  Vascular: Dorsalis Pedis artery and Posterior Tibial artery pedal pulses are 2/4 bilateral with immedate capillary fill time. Pedal hair growth present. No varicosities and no lower extremity edema present bilateral.   Neruologic: Grossly intact via light touch bilateral. Vibratory intact via tuning fork bilateral. Protective threshold with Semmes Wienstein monofilament intact to all pedal sites  bilateral. Patellar and Achilles deep tendon reflexes 2+ bilateral. No Babinski or clonus noted bilateral.   Musculoskeletal: No gross boney pedal deformities bilateral. No pain, crepitus, or limitation noted with foot and ankle range of motion bilateral. Muscular strength 5/5 in all groups tested bilateral.  Gait: Unassisted, Nonantalgic.    Radiographs:  None taken  Assessment & Plan:   Assessment: Ingrown nail tibial border hallux left nail dystrophy.  Plan: Discussed etiology pathology conservative versus surgical therapies.  Chemical  matricectomy was performed today after local anesthetic was administered she tolerated procedure well without complications.  We will follow-up with her in 2 weeks we did dispense both oral written home-going instructions for care and soaking of the toe as well as a prescription for Cortisporin Otic to be applied twice daily after soaking.  Sample of the nail was taken today for pathologic evaluation.     Perris Conwell T. [place], [place]

## 2022-01-26 NOTE — Patient Instructions (Signed)

## 2022-02-06 ENCOUNTER — Telehealth: Payer: Self-pay | Admitting: *Deleted

## 2022-02-06 NOTE — Telephone Encounter (Signed)
-----   Message from Garrel Ridgel, [place] sent at 02/06/2022  7:04 AM EDT ----- Negative for fungus.

## 2022-02-08 ENCOUNTER — Telehealth: Payer: Self-pay | Admitting: *Deleted

## 2022-02-08 NOTE — Telephone Encounter (Signed)
Called patient, no answer, left voice message of negative culture for nail fungus.

## 2022-02-11 ENCOUNTER — Ambulatory Visit: Payer: Medicare Other | Admitting: Podiatry

## 2022-02-11 ENCOUNTER — Telehealth: Payer: Self-pay | Admitting: Podiatry

## 2022-02-18 ENCOUNTER — Encounter: Payer: Self-pay | Admitting: Internal Medicine

## 2022-02-18 ENCOUNTER — Ambulatory Visit (INDEPENDENT_AMBULATORY_CARE_PROVIDER_SITE_OTHER): Payer: Medicare Other | Admitting: Internal Medicine

## 2022-02-18 VITALS — BP 130/78 | HR 81 | Temp 98.7°F | Ht 68.5 in | Wt 214.0 lb

## 2022-02-18 DIAGNOSIS — Z0000 Encounter for general adult medical examination without abnormal findings: Secondary | ICD-10-CM | POA: Diagnosis not present

## 2022-02-18 DIAGNOSIS — R911 Solitary pulmonary nodule: Secondary | ICD-10-CM | POA: Diagnosis not present

## 2022-02-18 DIAGNOSIS — R7301 Impaired fasting glucose: Secondary | ICD-10-CM

## 2022-02-18 DIAGNOSIS — K219 Gastro-esophageal reflux disease without esophagitis: Secondary | ICD-10-CM

## 2022-02-18 DIAGNOSIS — E782 Mixed hyperlipidemia: Secondary | ICD-10-CM

## 2022-02-18 DIAGNOSIS — I700 Atherosclerosis of aorta: Secondary | ICD-10-CM

## 2022-02-18 LAB — LIPID PANEL
Cholesterol: 157 mg/dL (ref 0–200)
HDL: 48.8 mg/dL (ref >39.00)
LDL Cholesterol: 89 mg/dL (ref 0–99)
NonHDL: 107.99
Total CHOL/HDL Ratio: 3
Triglycerides: 94 mg/dL (ref 0.0–149.0)
VLDL: 18.8 mg/dL (ref 0.0–40.0)

## 2022-02-18 LAB — CBC
HCT: 39.9 % (ref 36.0–46.0)
Hemoglobin: 13.3 g/dL (ref 12.0–15.0)
MCHC: 33.2 g/dL (ref 30.0–36.0)
MCV: 92.3 fl (ref 78.0–100.0)
Platelets: 208 10*3/uL (ref 150.0–400.0)
RBC: 4.32 Mil/uL (ref 3.87–5.11)
RDW: 12.8 % (ref 11.5–15.5)
WBC: 6.8 10*3/uL (ref 4.0–10.5)

## 2022-02-18 LAB — COMPREHENSIVE METABOLIC PANEL
ALT: 22 U/L (ref 0–35)
AST: 24 U/L (ref 0–37)
Albumin: 4.4 g/dL (ref 3.5–5.2)
Alkaline Phosphatase: 106 U/L (ref 39–117)
BUN: 17 mg/dL (ref 6–23)
CO2: 27 mEq/L (ref 19–32)
Calcium: 9.2 mg/dL (ref 8.4–10.5)
Chloride: 104 mEq/L (ref 96–112)
Creatinine, Ser: 0.79 mg/dL (ref 0.40–1.20)
GFR: 79.7 mL/min (ref >60.00)
Glucose, Bld: 90 mg/dL (ref 70–99)
Potassium: 4 mEq/L (ref 3.5–5.1)
Sodium: 141 mEq/L (ref 135–145)
Total Bilirubin: 0.8 mg/dL (ref 0.2–1.2)
Total Protein: 7.3 g/dL (ref 6.0–8.3)

## 2022-02-18 LAB — HEMOGLOBIN A1C: Hgb A1c MFr Bld: 6.2 % (ref 4.6–6.5)

## 2022-02-18 MED ORDER — ATORVASTATIN CALCIUM 40 MG PO TABS: 40.0000 mg | ORAL_TABLET | Freq: Every day | ORAL | 3 refills | Status: DC

## 2022-02-18 MED ORDER — PANTOPRAZOLE SODIUM 40 MG PO TBEC: 40.0000 mg | DELAYED_RELEASE_TABLET | Freq: Every day | ORAL | 3 refills | Status: DC

## 2022-02-18 NOTE — Progress Notes (Signed)
   Subjective:   Patient ID: Dominique Lane, female    DOB: 26-Oct-1960, 61 y.o.   MRN: [id number]  HPI The patient is a 61 YO female coming in for physical.   Review of Systems  Constitutional: Negative.   HENT: Negative.    Eyes: Negative.   Respiratory:  Negative for cough, chest tightness and shortness of breath.   Cardiovascular:  Negative for chest pain, palpitations and leg swelling.  Gastrointestinal:  Negative for abdominal distention, abdominal pain, constipation, diarrhea, nausea and vomiting.  Musculoskeletal: Negative.   Skin: Negative.   Neurological: Negative.   Psychiatric/Behavioral: Negative.      Objective:  Physical Exam Constitutional:      Appearance: She is well-developed.  HENT:     Head: Normocephalic and atraumatic.  Cardiovascular:     Rate and Rhythm: Normal rate and regular rhythm.  Pulmonary:     Effort: Pulmonary effort is normal. No respiratory distress.     Breath sounds: Normal breath sounds. No wheezing or rales.  Abdominal:     General: Bowel sounds are normal. There is no distension.     Palpations: Abdomen is soft.     Tenderness: There is no abdominal tenderness. There is no rebound.  Musculoskeletal:     Cervical back: Normal range of motion.  Skin:    General: Skin is warm and dry.  Neurological:     Mental Status: She is alert and oriented to person, place, and time.     Coordination: Coordination normal.     Vitals:   02/18/22 1129  BP: 130/78  Pulse: 81  Temp: 98.7 F (37.1 C)  SpO2: 95%  Weight: 214 lb (97.1 kg)  Height: 5' 8.5" (1.74 m)    Assessment & Plan:

## 2022-02-19 ENCOUNTER — Encounter: Payer: Self-pay | Admitting: Internal Medicine

## 2022-02-19 DIAGNOSIS — R911 Solitary pulmonary nodule: Secondary | ICD-10-CM | POA: Insufficient documentation

## 2022-02-19 DIAGNOSIS — R7301 Impaired fasting glucose: Secondary | ICD-10-CM | POA: Insufficient documentation

## 2022-02-19 NOTE — Assessment & Plan Note (Signed)
Follow up CT ordered for 4 mm nodule. She has had significant second hand smoke exposure though non-smoker.

## 2022-02-19 NOTE — Assessment & Plan Note (Signed)
Checking lipid panel and adjust lipitor 40 mg daily as needed. 

## 2022-02-19 NOTE — Assessment & Plan Note (Signed)
Taking protonix 40 mg daily and will refill as needed.

## 2022-02-19 NOTE — Assessment & Plan Note (Signed)
Taking lipitor 40 mg daily and aspirin 81 mg daily and will continue. 

## 2022-02-19 NOTE — Assessment & Plan Note (Signed)
Checking HgA1c and adjust as needed.  

## 2022-02-19 NOTE — Assessment & Plan Note (Signed)
Flu shot yearly. Covid-19 counseled. Shingrix counseled. Tetanus up to date. Colonoscopy done at [facility] getting records. Mammogram up to date, pap smear up to date and dexa up to date. Counseled about sun safety and mole surveillance. Counseled about the dangers of distracted driving. Given 10 year screening recommendations.

## 2022-02-22 ENCOUNTER — Telehealth: Payer: Self-pay | Admitting: Internal Medicine

## 2022-02-22 NOTE — Telephone Encounter (Signed)
[facility] - needs to talk to you about an imaging order -

## 2022-02-23 NOTE — Telephone Encounter (Signed)
Tried calling [facility] back to see what information was needed... However, I was on hold for 20 minutes.

## 2022-02-24 ENCOUNTER — Ambulatory Visit
Admission: RE | Admit: 2022-02-24 | Discharge: 2022-02-24 | Disposition: A | Discharge status: Home / Self Care | Payer: Medicare Other | Source: Ambulatory Visit | Attending: Obstetrics & Gynecology | Admitting: Obstetrics & Gynecology

## 2022-02-24 ENCOUNTER — Ambulatory Visit: Payer: Medicare Other | Admitting: Obstetrics & Gynecology

## 2022-02-24 DIAGNOSIS — N6312 Unspecified lump in the right breast, upper inner quadrant: Secondary | ICD-10-CM | POA: Diagnosis not present

## 2022-02-24 DIAGNOSIS — R928 Other abnormal and inconclusive findings on diagnostic imaging of breast: Secondary | ICD-10-CM | POA: Diagnosis not present

## 2022-02-24 DIAGNOSIS — Z9889 Other specified postprocedural states: Secondary | ICD-10-CM

## 2022-03-02 ENCOUNTER — Other Ambulatory Visit: Payer: Self-pay | Admitting: Internal Medicine

## 2022-03-02 DIAGNOSIS — R918 Other nonspecific abnormal finding of lung field: Secondary | ICD-10-CM

## 2022-03-02 NOTE — Progress Notes (Signed)
c 

## 2022-03-10 DIAGNOSIS — H524 Presbyopia: Secondary | ICD-10-CM | POA: Diagnosis not present

## 2022-03-10 DIAGNOSIS — H5203 Hypermetropia, bilateral: Secondary | ICD-10-CM | POA: Diagnosis not present

## 2022-03-10 DIAGNOSIS — H52203 Unspecified astigmatism, bilateral: Secondary | ICD-10-CM | POA: Diagnosis not present

## 2022-03-10 DIAGNOSIS — H25813 Combined forms of age-related cataract, bilateral: Secondary | ICD-10-CM | POA: Diagnosis not present

## 2022-03-22 IMAGING — US US BREAST*R* LIMITED INC AXILLA
1 series · 6 of 6 positions shown · non-contrast
Comparison: Previous exam(s).

CLINICAL DATA: Follow-up right breast ultrasound guided core needle
biopsy performed on 03/05/2020 with concordant benign pathology
results of benign breast tissue. A six-month follow-up right
diagnostic mammogram and ultrasound was recommended at that. The
patient did not return for the 6 follow-up.

EXAM:
DIGITAL DIAGNOSTIC BILATERAL MAMMOGRAM WITH TOMOSYNTHESIS AND CAD;
ULTRASOUND RIGHT BREAST LIMITED
TECHNIQUE: Bilateral digital diagnostic mammography and breast tomosynthesis
was performed. The images were evaluated with computer-aided
detection.; Targeted ultrasound examination of the right breast was
performed

[Series 1: us breast*right* limited inc axilla · 0.06mm/px · 6 of 6 slices shown]
[im 1/6]
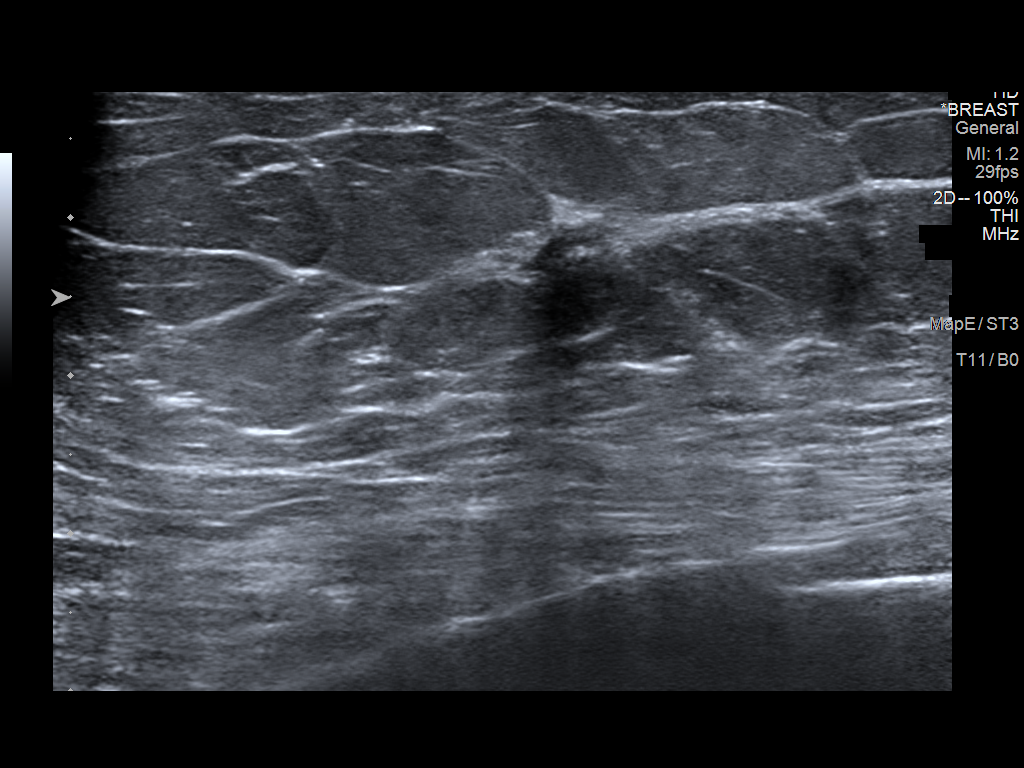
[im 2/6]
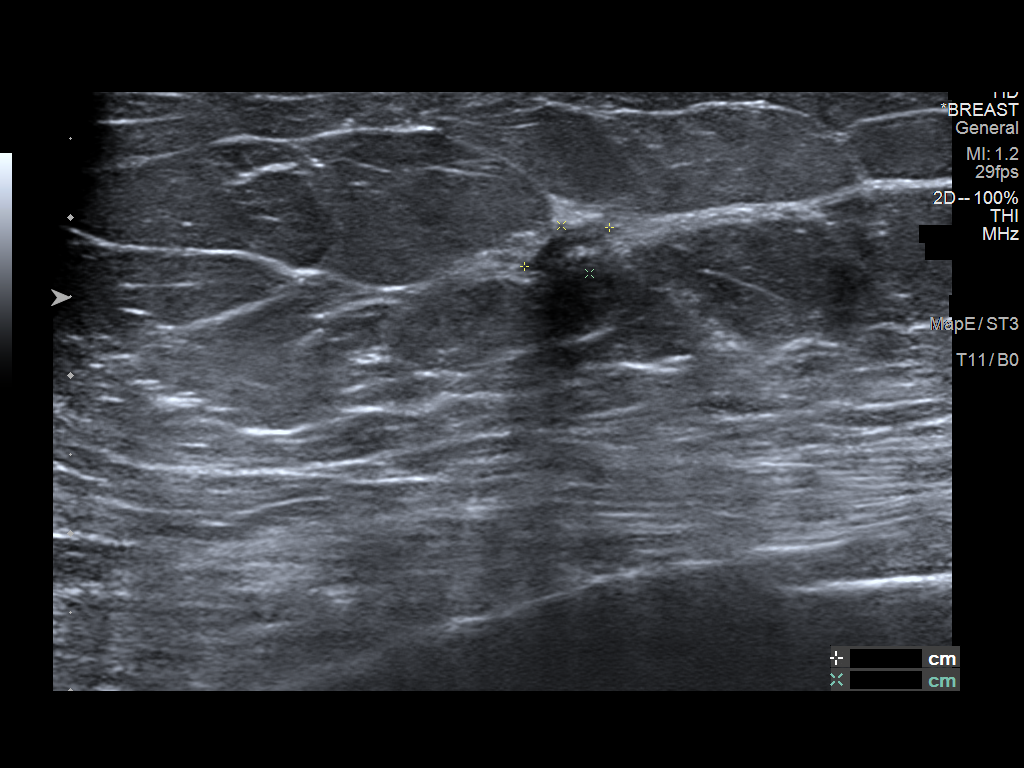
[im 3/6]
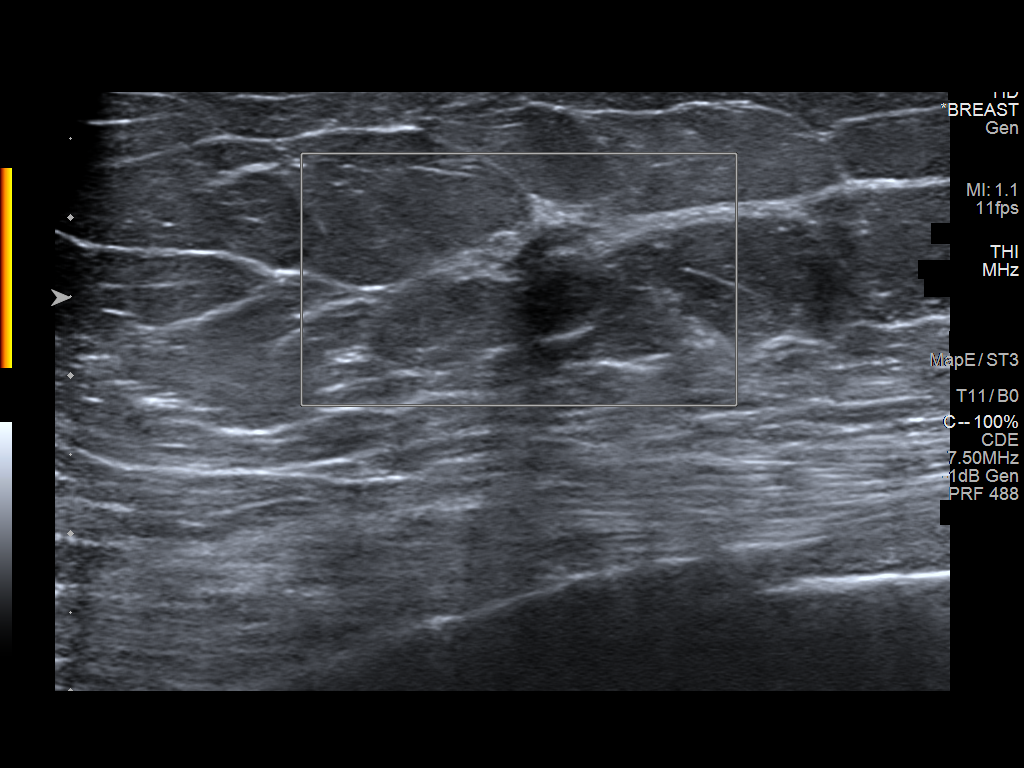
[im 4/6]
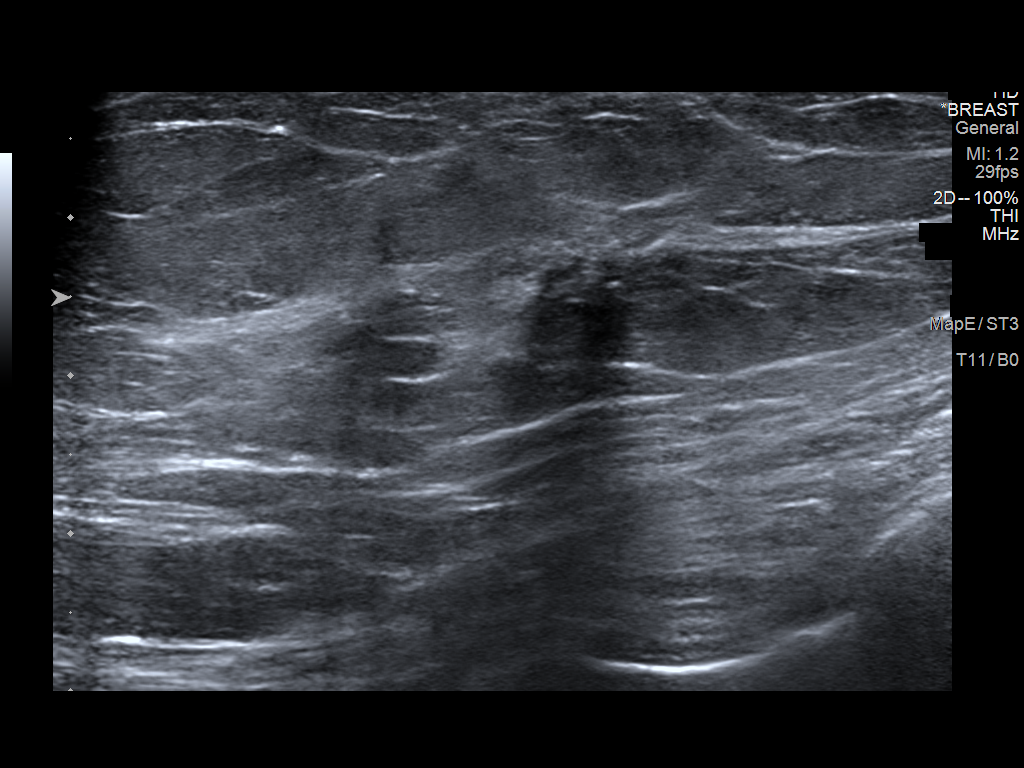
[im 5/6]
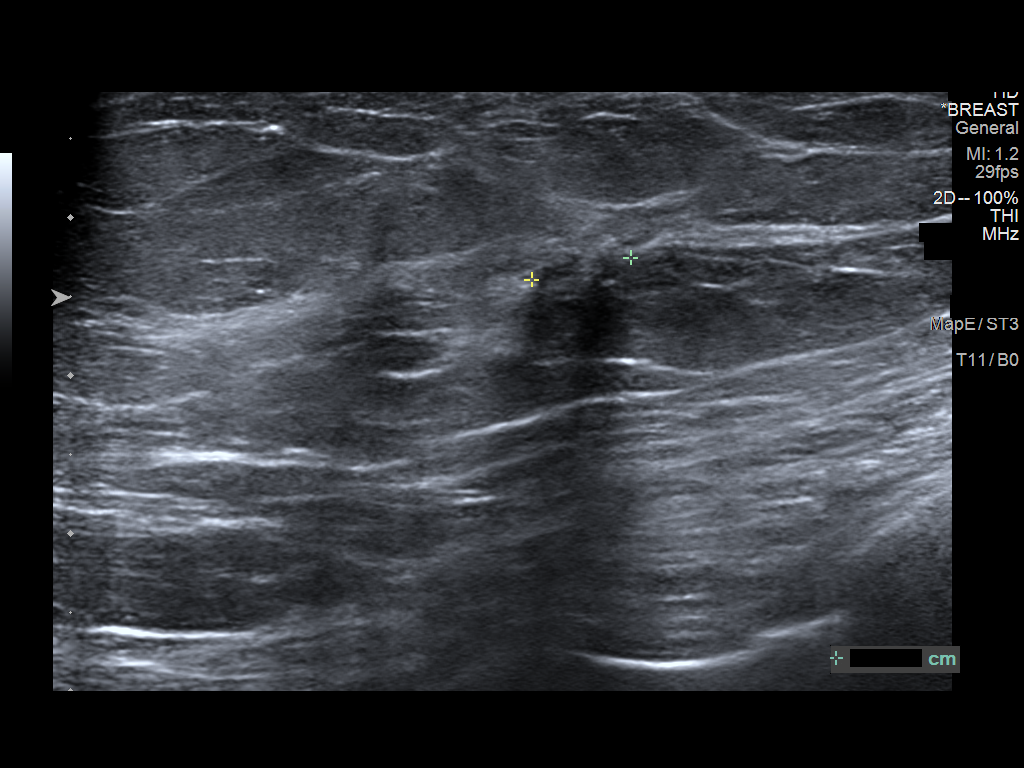
[im 6/6]
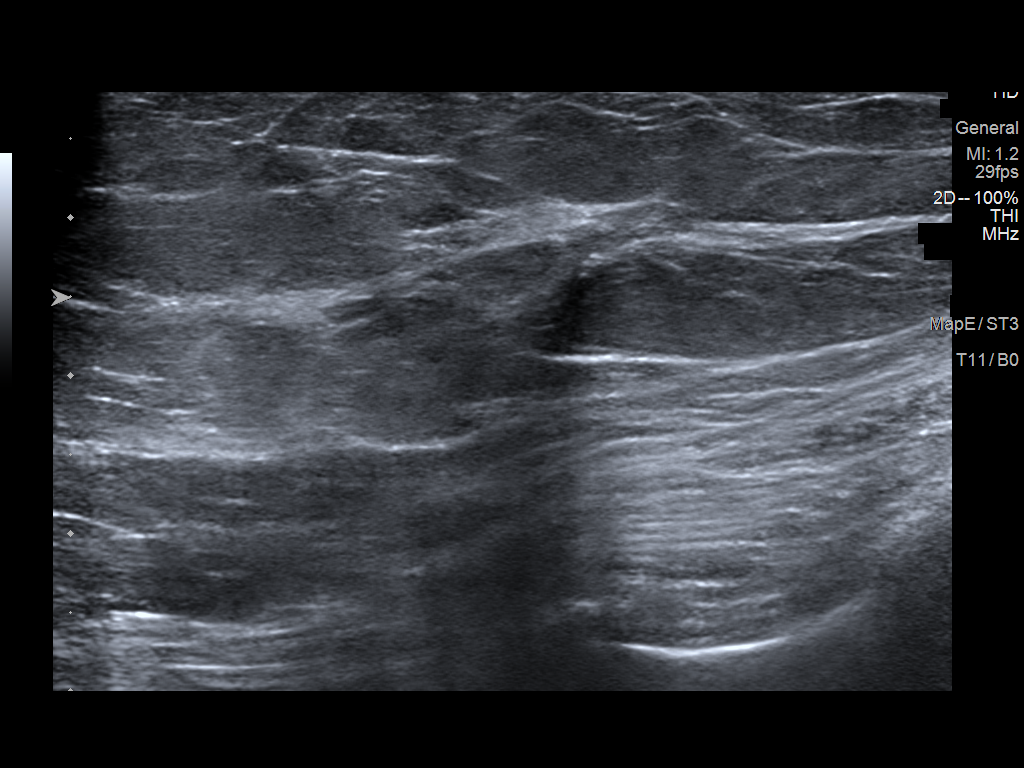

[6 of 6 positions shown; findings below may reference images not displayed]

ACR Breast Density Category b: There are scattered areas of
fibroglandular density.
FINDINGS: There is a ribbon shaped biopsy marker clip at the location of the
asymmetry seen in the upper inner right breast on 02/06/2020. The
asymmetry is smaller following biopsy. No findings elsewhere in
either breast suspicious for malignancy.

Targeted ultrasound is performed, showing decreased size of area of
shadowing seen in the right breast on 02/22/2020 at the anterior
aspect of the area shadowing, there is currently demonstrated a
crescent shaped hypoechoic area measuring 6 x 6 x 4 mm. This
corresponds to the mammographic asymmetry and is not as well
delineated previously. This corresponds to the area of biopsied with
a small clip artifact in that area.
IMPRESSION: 1. Decreased size of the mammographic asymmetry on the right
following ultrasound-guided core needle biopsy with the ribbon
shaped biopsy marker clip at the location of the asymmetry. This has
a better defined sonographic correlate today with a smaller area of
associated posterior acoustical shadowing.
2. No evidence of malignancy elsewhere in either breast.

RECOMMENDATION:
Bilateral diagnostic mammogram and right breast ultrasound in 1 year
to reassess the residual changes at the site of previous biopsy.

I have discussed the findings and recommendations with the patient.
If applicable, a reminder letter will be sent to the patient
regarding the next appointment.

BI-RADS CATEGORY  3: Probably benign.

## 2022-03-22 IMAGING — MG DIGITAL DIAGNOSTIC BILAT W/ TOMO W/ CAD
8 series · 8 of 24 positions shown · non-contrast
Comparison: Previous exam(s).

CLINICAL DATA: Follow-up right breast ultrasound guided core needle
biopsy performed on 03/05/2020 with concordant benign pathology
results of benign breast tissue. A six-month follow-up right
diagnostic mammogram and ultrasound was recommended at that. The
patient did not return for the 6 follow-up.

EXAM:
DIGITAL DIAGNOSTIC BILATERAL MAMMOGRAM WITH TOMOSYNTHESIS AND CAD;
ULTRASOUND RIGHT BREAST LIMITED
TECHNIQUE: Bilateral digital diagnostic mammography and breast tomosynthesis
was performed. The images were evaluated with computer-aided
detection.; Targeted ultrasound examination of the right breast was
performed

[R MLO synth-2D]
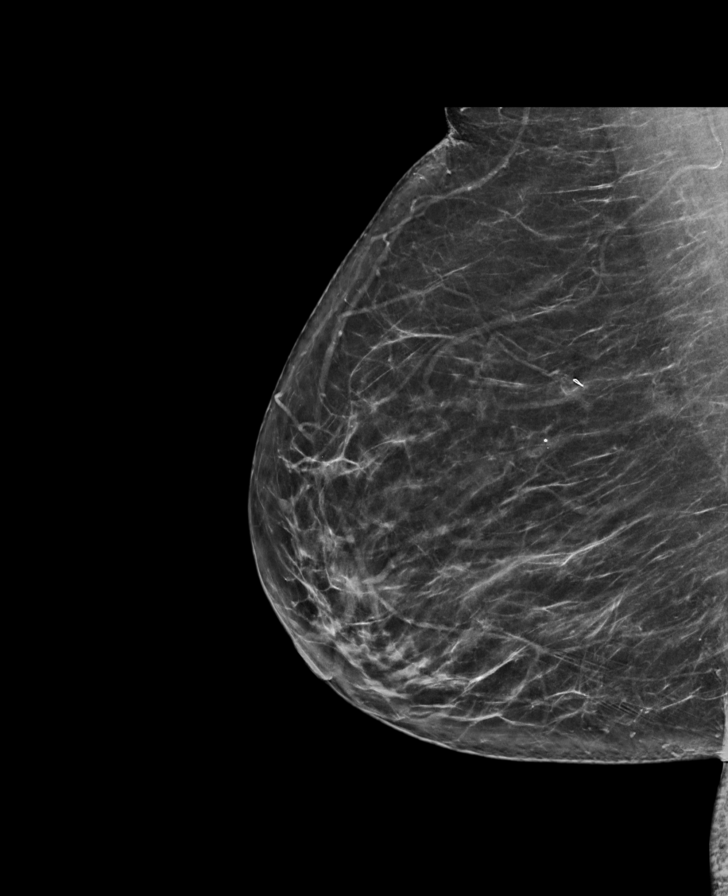

[L CC synth-2D]
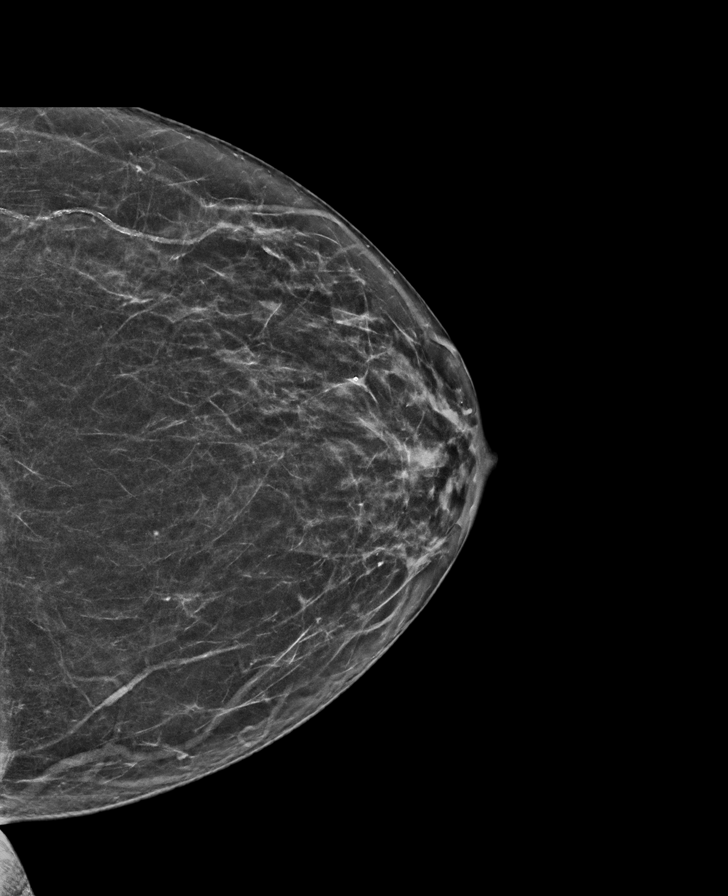

[L MLO synth-2D]
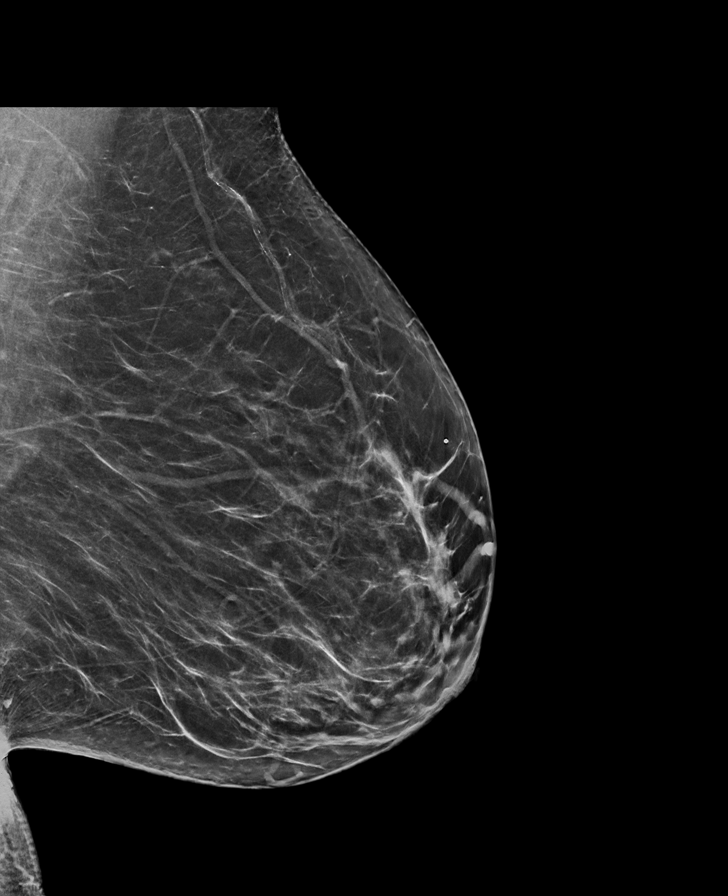

[R CC synth-2D]
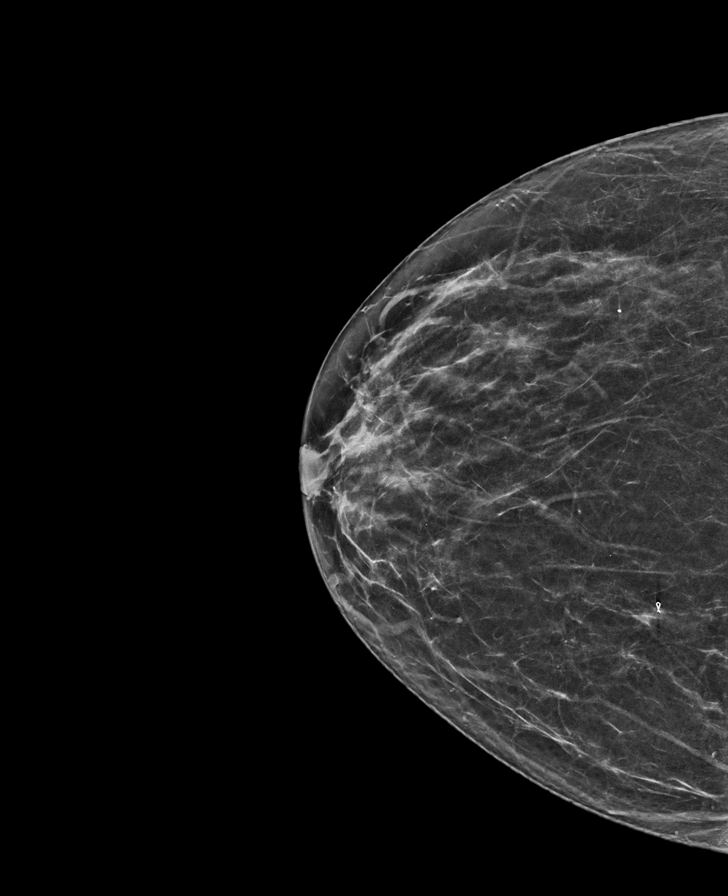

[L CC tomo · tomo slice 32/63.0]
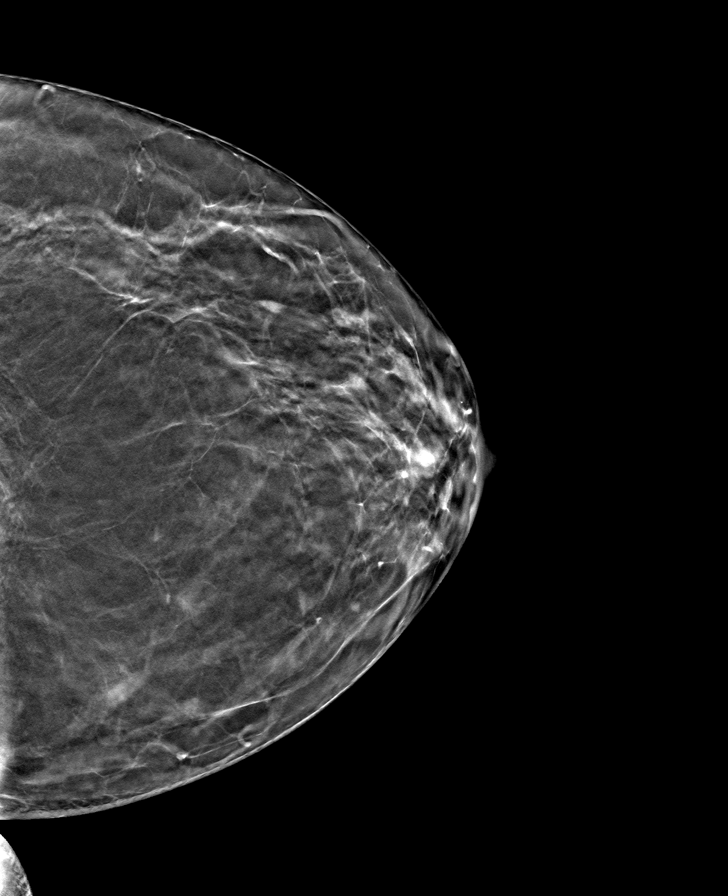

[R MLO tomo · tomo slice 39/76.0]
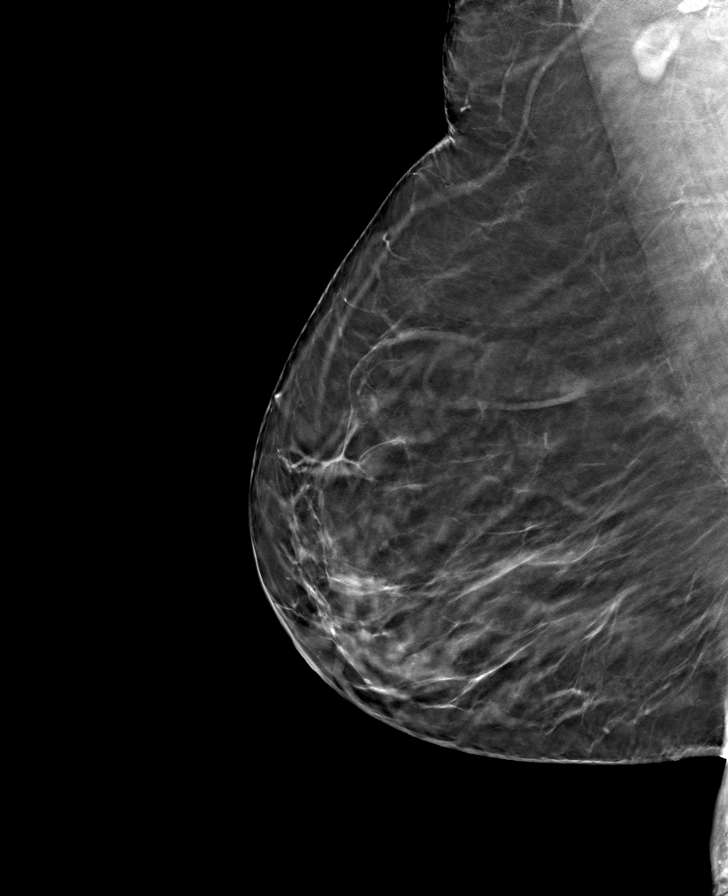

[L MLO tomo · tomo slice 38/75.0]
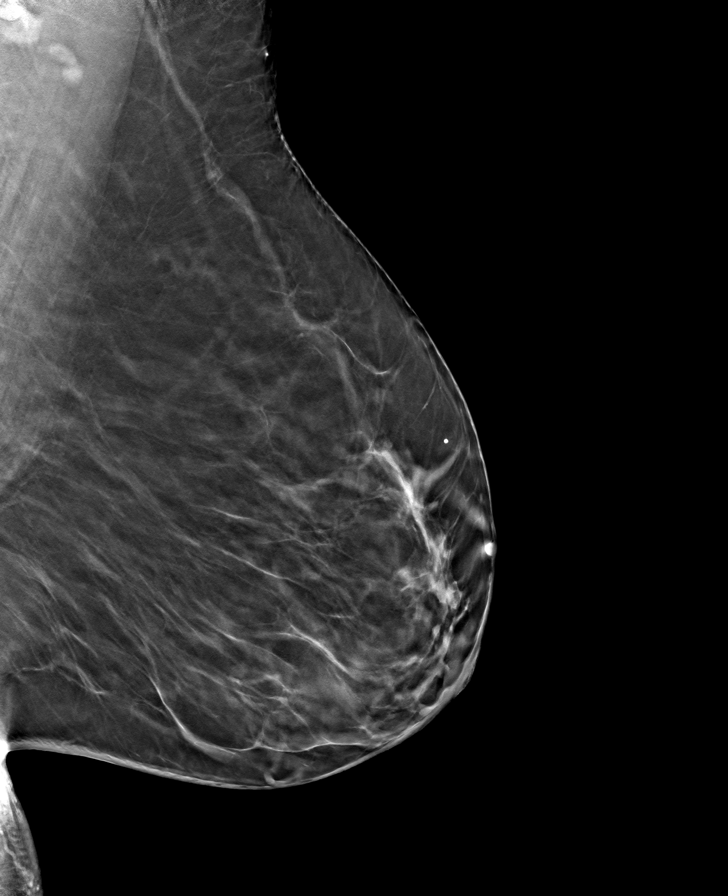

[R CC tomo · tomo slice 33/64.0]
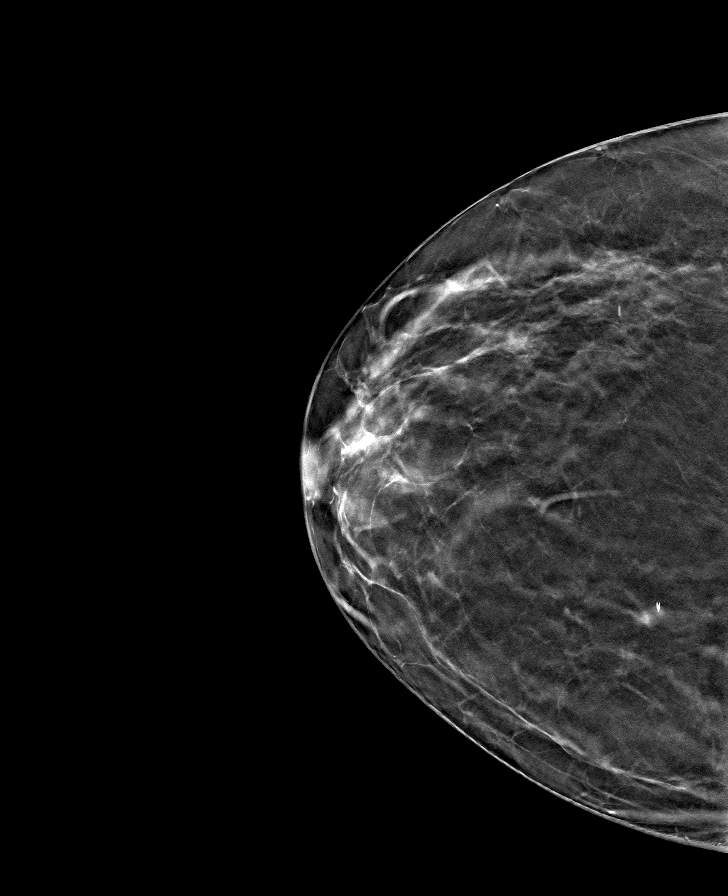

[8 of 24 positions shown; findings below may reference images not displayed]

ACR Breast Density Category b: There are scattered areas of
fibroglandular density.
FINDINGS: There is a ribbon shaped biopsy marker clip at the location of the
asymmetry seen in the upper inner right breast on 02/06/2020. The
asymmetry is smaller following biopsy. No findings elsewhere in
either breast suspicious for malignancy.

Targeted ultrasound is performed, showing decreased size of area of
shadowing seen in the right breast on 02/22/2020 at the anterior
aspect of the area shadowing, there is currently demonstrated a
crescent shaped hypoechoic area measuring 6 x 6 x 4 mm. This
corresponds to the mammographic asymmetry and is not as well
delineated previously. This corresponds to the area of biopsied with
a small clip artifact in that area.
IMPRESSION: 1. Decreased size of the mammographic asymmetry on the right
following ultrasound-guided core needle biopsy with the ribbon
shaped biopsy marker clip at the location of the asymmetry. This has
a better defined sonographic correlate today with a smaller area of
associated posterior acoustical shadowing.
2. No evidence of malignancy elsewhere in either breast.

RECOMMENDATION:
Bilateral diagnostic mammogram and right breast ultrasound in 1 year
to reassess the residual changes at the site of previous biopsy.

I have discussed the findings and recommendations with the patient.
If applicable, a reminder letter will be sent to the patient
regarding the next appointment.

BI-RADS CATEGORY  3: Probably benign.

## 2022-04-04 ENCOUNTER — Other Ambulatory Visit: Payer: Self-pay | Admitting: Cardiovascular Disease

## 2022-04-30 ENCOUNTER — Encounter: Payer: Self-pay | Admitting: Internal Medicine

## 2022-04-30 MED ORDER — ATORVASTATIN CALCIUM 40 MG PO TABS: 40.0000 mg | ORAL_TABLET | Freq: Every day | ORAL | 3 refills | Status: DC

## 2022-04-30 MED ORDER — PANTOPRAZOLE SODIUM 40 MG PO TBEC: 40.0000 mg | DELAYED_RELEASE_TABLET | Freq: Every day | ORAL | 3 refills | Status: DC

## 2022-05-14 ENCOUNTER — Ambulatory Visit
Admission: RE | Admit: 2022-05-14 | Discharge: 2022-05-14 | Disposition: A | Discharge status: Home / Self Care | Payer: Medicare Other | Source: Ambulatory Visit | Attending: Internal Medicine | Admitting: Internal Medicine

## 2022-05-14 DIAGNOSIS — R911 Solitary pulmonary nodule: Secondary | ICD-10-CM | POA: Diagnosis not present

## 2022-05-14 DIAGNOSIS — I700 Atherosclerosis of aorta: Secondary | ICD-10-CM | POA: Diagnosis not present

## 2022-05-14 DIAGNOSIS — R918 Other nonspecific abnormal finding of lung field: Secondary | ICD-10-CM | POA: Diagnosis not present

## 2022-07-01 ENCOUNTER — Ambulatory Visit
Admission: EM | Admit: 2022-07-01 | Discharge: 2022-07-01 | Disposition: A | Discharge status: Home / Self Care | Payer: Medicare Other | Attending: Internal Medicine | Admitting: Internal Medicine

## 2022-07-01 ENCOUNTER — Ambulatory Visit: Payer: Medicare Other | Admitting: Family Medicine

## 2022-07-01 DIAGNOSIS — J069 Acute upper respiratory infection, unspecified: Secondary | ICD-10-CM

## 2022-07-01 MED ORDER — FLUTICASONE PROPIONATE 50 MCG/ACT NA SUSP: 1.0000 | Freq: Every day | NASAL | 0 refills | Status: DC

## 2022-07-01 MED ORDER — BENZONATATE 100 MG PO CAPS: 100.0000 mg | ORAL_CAPSULE | Freq: Three times a day (TID) | ORAL | 0 refills | Status: DC | PRN

## 2022-07-01 MED ORDER — PROMETHAZINE-DM 6.25-15 MG/5ML PO SYRP: 5.0000 mL | ORAL_SOLUTION | Freq: Every evening | ORAL | 0 refills | Status: DC | PRN

## 2022-07-01 NOTE — ED Provider Notes (Addendum)
[name]    CSN: [id number] Arrival date & time: 07/01/22  2094      History   Chief Complaint Chief Complaint  Patient presents with   Cough    HPI Dominique Lane is a 62 y.o. female.   Patient presents with 2 to 3-day history of cough and nasal congestion.  Reports cough is dry.  Her significant other has similar symptoms.  Denies any fevers at home.  Denies chest pain, shortness of breath, sore throat, ear pain, nausea, vomiting, diarrhea, abdominal pain.  Denies history of asthma or COPD and patient does not smoke cigarettes.  Patient has taken over-the-counter cough medication with minimal improvement in symptoms.   Cough   Past Medical History:  Diagnosis Date   Hyperlipidemia    MVP (mitral valve prolapse)    Osteopenia 12/2018   T score of -1.9 FRAX 8.8% / 1.0%    Patient Active Problem List   Diagnosis Date Noted   Lung nodule 02/19/2022   Impaired fasting blood sugar 02/19/2022   Aortic atherosclerosis ([place]) 02/09/2021   Dermatochalasis of both upper eyelids 10/25/2019   Laryngopharyngeal reflux (LPR) 09/22/2017   Supraclavicular fossa fullness 12/18/2016   Hematuria 08/19/2015   Routine general medical examination at a health care facility 12/05/2014   HYPERLIPIDEMIA 04/10/2007   GERD (gastroesophageal reflux disease) 04/10/2007    Past Surgical History:  Procedure Laterality Date   CARPAL TUNNEL RELEASE Bilateral    CHOLECYSTECTOMY     COLONOSCOPY  2011   Dr Amedeo Plenty   fractured mandible  1997   repair   GANGLION CYST EXCISION     LAPAROSCOPIC LYSIS OF ADHESIONS N/A 09/13/2012   Procedure: LAPAROSCOPIC LYSIS OF ADHESIONS;  Surgeon: Terrance Mass, MD;  Location: [facility];  Service: Gynecology;  Laterality: N/A;   LAPAROSCOPY N/A 09/13/2012   Procedure: LAPAROSCOPY OPERATIVE;  Surgeon: Terrance Mass, MD;  Location: [facility];  Service: Gynecology;  Laterality: N/A;   OVARIAN CYST REMOVAL     Right   ROTATOR CUFF REPAIR     twice  on left and once on right   SALPINGOOPHORECTOMY Bilateral 09/13/2012   Procedure: SALPINGO OOPHORECTOMY;  Surgeon: Terrance Mass, MD;  Location: [facility];  Service: Gynecology;  Laterality: Bilateral;   TUBAL LIGATION     VAGINAL HYSTERECTOMY      OB History     Gravida  2   Para  2   Term  2   Preterm      AB      Living  2      SAB      IAB      Ectopic      Multiple      Live Births               Home Medications    Prior to Admission medications   Medication Sig Start Date End Date Taking? Authorizing Provider  benzonatate (TESSALON) 100 MG capsule Take 1 capsule (100 mg total) by mouth every 8 (eight) hours as needed for cough. 07/01/22  Yes Rowland Ericsson, Hildred Alamin E, FNP  fluticasone (FLONASE) 50 MCG/ACT nasal spray Place 1 spray into both nostrils daily. 07/01/22  Yes Saida Lonon, Michele Rockers, FNP  promethazine-dextromethorphan (PROMETHAZINE-DM) 6.25-15 MG/5ML syrup Take 5 mLs by mouth at bedtime as needed for cough. 07/01/22  Yes Oswaldo Conroy E, FNP  aspirin EC 81 MG tablet Take 81 mg by mouth daily. Pt takes every other day.    [provider]  atorvastatin (LIPITOR) 40 MG tablet Take 1 tablet (40 mg total) by mouth daily. 04/30/22   Hoyt Koch, MD  calcium gluconate 500 MG tablet Take 1 tablet by mouth.    [provider]  Cyanocobalamin (VITAMIN B-12 PO) Take 1 tablet by mouth every other day.    [provider]  Multiple Vitamin (MULTI-VITAMIN) tablet Take by mouth every other day.    [provider]  pantoprazole (PROTONIX) 40 MG tablet Take 1 tablet (40 mg total) by mouth daily. 04/30/22   Hoyt Koch, MD  Potassium (POTASSIMIN PO) Take 1 tablet by mouth daily.    [provider]  VITAMIN D, CHOLECALCIFEROL, PO Take 1 tablet by mouth every other day.    [provider]    Family History Family History  Problem Relation Age of Onset   Heart disease Mother 81       Sudden death.    Bladder Cancer  Father 17   Lymphoma Paternal Aunt    Hypertension Sister    Ovarian cancer Sister 74   Leukemia Paternal Grandfather    CAD Neg Hx    Colon cancer Neg Hx    Breast cancer Neg Hx     Social History Social History   Tobacco Use   Smoking status: Never   Smokeless tobacco: Never  Vaping Use   Vaping Use: Never used  Substance Use Topics   Alcohol use: Yes    Comment: 1 a week   Drug use: Never     Allergies   Cephalosporins, Naproxen sodium, and Hydromet [hydrocodone bit-homatrop mbr]   Review of Systems Review of Systems Per HPI  Physical Exam Triage Vital Signs ED Triage Vitals [07/01/22 0949]  Enc Vitals Group     BP 121/83     Pulse Rate 80     Resp 16     Temp 98 F (36.7 C)     Temp Source Oral     SpO2 93 %     Weight      Height      Head Circumference      Peak Flow      Pain Score 0     Pain Loc      Pain Edu?      Excl. in [place]?    No data found.  Updated Vital Signs BP 121/83 (BP Location: Left Arm)   Pulse 80   Temp 98 F (36.7 C) (Oral)   Resp 16   SpO2 97%   Visual Acuity Right Eye Distance:   Left Eye Distance:   Bilateral Distance:    Right Eye Near:   Left Eye Near:    Bilateral Near:     Physical Exam Constitutional:      General: She is not in acute distress.    Appearance: Normal appearance. She is not toxic-appearing or diaphoretic.  HENT:     Head: Normocephalic and atraumatic.     Right Ear: Tympanic membrane and ear canal normal.     Left Ear: Tympanic membrane and ear canal normal.     Nose: Congestion present.     Mouth/Throat:     Mouth: Mucous membranes are moist.     Pharynx: No posterior oropharyngeal erythema.  Eyes:     Extraocular Movements: Extraocular movements intact.     Conjunctiva/sclera: Conjunctivae normal.     Pupils: Pupils are equal, round, and reactive to light.  Cardiovascular:     Rate  and Rhythm: Normal rate and regular rhythm.     Pulses: Normal pulses.     Heart sounds: Normal  heart sounds.  Pulmonary:     Effort: Pulmonary effort is normal. No respiratory distress.     Breath sounds: Normal breath sounds. No stridor. No wheezing, rhonchi or rales.  Abdominal:     General: Abdomen is flat. Bowel sounds are normal.     Palpations: Abdomen is soft.  Musculoskeletal:        General: Normal range of motion.     Cervical back: Normal range of motion.  Skin:    General: Skin is warm and dry.  Neurological:     General: No focal deficit present.     Mental Status: She is alert and oriented to person, place, and time. Mental status is at baseline.  Psychiatric:        Mood and Affect: Mood normal.        Behavior: Behavior normal.      UC Treatments / Results  Labs (all labs ordered are listed, but only abnormal results are displayed) Labs Reviewed - No data to display  EKG   Radiology No results found.  Procedures Procedures (including critical care time)  Medications Ordered in UC Medications - No data to display  Initial Impression / Assessment and Plan / UC Course  I have reviewed the triage vital signs and the nursing notes.  Pertinent labs & imaging results that were available during my care of the patient were reviewed by me and considered in my medical decision making (see chart for details).     Patient presents with symptoms likely from a viral upper respiratory infection. Differential includes bacterial pneumonia, sinusitis, allergic rhinitis, COVID-19, flu, RSV. Do not suspect underlying cardiopulmonary process. Symptoms seem unlikely related to ACS, CHF or COPD exacerbations, pneumonia, pneumothorax. Patient is nontoxic appearing and not in need of emergent medical intervention.  She declined COVID testing.  Recommended symptom control with occasions of supportive care.  Patient sent prescriptions.  Advised patient Promethazine DM can cause drowsiness.  Patient requesting antibiotics given that her significant other received antibiotics  for his similar illness.  Advised patient this is viral and antibiotics do not treat this.  She voiced understanding.  Original triage oxygen was 93% but recheck was normal.  I also rechecked oxygen during physical exam and it sustained 97 to 98%.  Therefore, no further workup for this is necessary.  Return if symptoms fail to improve in 1-2 weeks or you develop shortness of breath, chest pain, severe headache. Patient states understanding and is agreeable.  Discharged with PCP followup.  Final Clinical Impressions(s) / UC Diagnoses   Final diagnoses:  Viral upper respiratory tract infection with cough     Discharge Instructions      You have a viral illness that will run its course and self resolve with symptomatic treatment as we discussed.  I have prescribed you 3 medications to help alleviate symptoms.  Please be advised that Promethazine DM can cause drowsiness.  Follow-up if symptoms persist or worsen.    ED Prescriptions     Medication Sig Dispense Auth. Provider   fluticasone (FLONASE) 50 MCG/ACT nasal spray Place 1 spray into both nostrils daily. 16 g Adali Pennings, Hildred Alamin E, [place]   benzonatate (TESSALON) 100 MG capsule Take 1 capsule (100 mg total) by mouth every 8 (eight) hours as needed for cough. 21 capsule Oswaldo Conroy E, [place]   promethazine-dextromethorphan (PROMETHAZINE-DM) 6.25-15 MG/5ML syrup Take  5 mLs by mouth at bedtime as needed for cough. 118 mL Teodora Medici, [place]      PDMP not reviewed this encounter.   Teodora Medici, [place] 07/01/22 [place], [place], [place] 07/01/22 [place], [place], [place] 07/01/22 1045

## 2022-07-01 NOTE — ED Triage Notes (Signed)
Pt c/o cough, headache,   Onset ~ 2 days ago

## 2022-07-01 NOTE — Discharge Instructions (Signed)
You have a viral illness that will run its course and self resolve with symptomatic treatment as we discussed.  I have prescribed you 3 medications to help alleviate symptoms.  Please be advised that Promethazine DM can cause drowsiness.  Follow-up if symptoms persist or worsen.

## 2022-10-21 ENCOUNTER — Encounter: Payer: Self-pay | Admitting: Internal Medicine

## 2022-10-21 ENCOUNTER — Ambulatory Visit (INDEPENDENT_AMBULATORY_CARE_PROVIDER_SITE_OTHER): Payer: Medicare Other | Admitting: Internal Medicine

## 2022-10-21 VITALS — BP 138/82 | HR 82 | Temp 98.5°F | Ht 68.5 in | Wt 211.0 lb

## 2022-10-21 DIAGNOSIS — J069 Acute upper respiratory infection, unspecified: Secondary | ICD-10-CM

## 2022-10-21 MED ORDER — BENZONATATE 200 MG PO CAPS: 200.0000 mg | ORAL_CAPSULE | Freq: Three times a day (TID) | ORAL | 0 refills | Status: DC | PRN

## 2022-10-21 MED ORDER — AZITHROMYCIN 250 MG PO TABS: ORAL_TABLET | ORAL | 0 refills | Status: DC

## 2022-10-21 NOTE — Progress Notes (Signed)
Subjective:    Patient ID: Dominique Lane, female    DOB: 03/09/1961, 62 y.o.   MRN: [id number]      HPI Safa is here for  Chief Complaint  Patient presents with   Cough    Started last Thursday; Nasal drainage last week, cough (feeling somewhat better) itchy eyes, SOB from coughing; Blowing out mucus but not coughing it up. Feels like mucus is lodged in her throat (she is feeling better today) Cough keeps her up at night    She is here for an acute visit for cold symptoms.   Her symptoms started last one week  She is experiencing chills, fatigue, nasal congestion, PND, runy nose, eye itching, cough that is productive, some SOB, HA's and lightheadedness  She has tried taking  sinus and allergy, robitussin, cold medication   Home covid test negative.   Medications and allergies reviewed with patient and updated if appropriate.  Current Outpatient Medications on File Prior to Visit  Medication Sig Dispense Refill   aspirin EC 81 MG tablet Take 81 mg by mouth daily. Pt takes every other day.     atorvastatin (LIPITOR) 40 MG tablet Take 1 tablet (40 mg total) by mouth daily. 90 tablet 3   calcium gluconate 500 MG tablet Take 1 tablet by mouth.     Cyanocobalamin (VITAMIN B-12 PO) Take 1 tablet by mouth every other day.     fluticasone (FLONASE) 50 MCG/ACT nasal spray Place 1 spray into both nostrils daily. 16 g 0   Multiple Vitamin (MULTI-VITAMIN) tablet Take by mouth every other day.     pantoprazole (PROTONIX) 40 MG tablet Take 1 tablet (40 mg total) by mouth daily. 90 tablet 3   Potassium (POTASSIMIN PO) Take 1 tablet by mouth daily.     promethazine-dextromethorphan (PROMETHAZINE-DM) 6.25-15 MG/5ML syrup Take 5 mLs by mouth at bedtime as needed for cough. 118 mL 0   VITAMIN D, CHOLECALCIFEROL, PO Take 1 tablet by mouth every other day.     No current facility-administered medications on file prior to visit.    Review of Systems  Constitutional:  Positive for  chills and fatigue. Negative for fever.  HENT:  Positive for congestion, postnasal drip and rhinorrhea. Negative for ear pain, sinus pressure (resolved) and sore throat (from PND).   Eyes:  Positive for itching.  Respiratory:  Positive for cough (light yellow sputum - stuck in throat, hears crackling in chest) and shortness of breath (off and on - when coughing). Negative for chest tightness and wheezing.   Gastrointestinal:  Negative for diarrhea and nausea.  Musculoskeletal:  Negative for myalgias.  Neurological:  Positive for light-headedness and headaches (from coughing). Negative for dizziness.       Objective:   Vitals:   10/21/22 0924  BP: 138/82  Pulse: 82  Temp: 98.5 F (36.9 C)  SpO2: 97%   BP Readings from Last 3 Encounters:  10/21/22 138/82  07/01/22 121/83  02/18/22 130/78   Wt Readings from Last 3 Encounters:  10/21/22 211 lb (95.7 kg)  02/18/22 214 lb (97.1 kg)  09/18/21 185 lb (83.9 kg)   Body mass index is 31.62 kg/m.    Physical Exam Constitutional:      General: She is not in acute distress.    Appearance: Normal appearance. She is not ill-appearing.  HENT:     Head: Normocephalic and atraumatic.     Right Ear: Tympanic membrane, ear canal and external ear normal.  Left Ear: Tympanic membrane, ear canal and external ear normal.     Mouth/Throat:     Mouth: Mucous membranes are moist.     Pharynx: No oropharyngeal exudate or posterior oropharyngeal erythema.  Eyes:     Conjunctiva/sclera: Conjunctivae normal.  Cardiovascular:     Rate and Rhythm: Normal rate and regular rhythm.  Pulmonary:     Effort: Pulmonary effort is normal. No respiratory distress.     Breath sounds: Normal breath sounds. No wheezing or rales.  Musculoskeletal:     Cervical back: Neck supple. No tenderness.  Lymphadenopathy:     Cervical: No cervical adenopathy.  Skin:    General: Skin is warm and dry.  Neurological:     Mental Status: She is alert.             Assessment & Plan:    URI Acute Covid test at home negative Cough and SOB getting worse Concern for bacterial cause Start Zpak, tessalon perles Continue otc medications for symptoms Call if no improvement

## 2022-10-21 NOTE — Patient Instructions (Addendum)
       Medications changes include :   zpak, tessalon perles       Return if symptoms worsen or fail to improve.

## 2022-10-22 ENCOUNTER — Other Ambulatory Visit: Payer: Self-pay | Admitting: Obstetrics & Gynecology

## 2022-10-22 DIAGNOSIS — Z1231 Encounter for screening mammogram for malignant neoplasm of breast: Secondary | ICD-10-CM

## 2022-11-21 ENCOUNTER — Ambulatory Visit
Admission: EM | Admit: 2022-11-21 | Discharge: 2022-11-21 | Disposition: A | Discharge status: Home / Self Care | Payer: Medicare Other

## 2022-11-21 DIAGNOSIS — J020 Streptococcal pharyngitis: Secondary | ICD-10-CM | POA: Diagnosis not present

## 2022-11-21 LAB — POCT RAPID STREP A (OFFICE): Rapid Strep A Screen: POSITIVE — AB

## 2022-11-21 MED ORDER — PENICILLIN V POTASSIUM 500 MG PO TABS: 500.0000 mg | ORAL_TABLET | Freq: Two times a day (BID) | ORAL | 0 refills | Status: AC

## 2022-11-21 NOTE — ED Provider Notes (Signed)
[name]    CSN: [id number] Arrival date & time: 11/21/22  1025      History   Chief Complaint Chief Complaint  Patient presents with   Sore Throat    HPI Dominique Lane is a 62 y.o. female.   Pleasant 62 year old female presents due to concerns of a sore throat.  Symptoms started mid afternoon yesterday.  She reports some nausea and a queasy stomach yesterday.  Had a fever prior to presentation.  Took some Tylenol around 930 this morning.  She denies a rash.  She states her whole body has been achy since yesterday afternoon.  Slight headache.  Her primary symptom however is the sore throat.  She denies any known sick contacts.  She denies cough, shortness of breath, wheezing, chest pain.   Sore Throat    Past Medical History:  Diagnosis Date   Hyperlipidemia    MVP (mitral valve prolapse)    Osteopenia 12/2018   T score of -1.9 FRAX 8.8% / 1.0%    Patient Active Problem List   Diagnosis Date Noted   Lung nodule 02/19/2022   Impaired fasting blood sugar 02/19/2022   Aortic atherosclerosis (HCC) 02/09/2021   Dermatochalasis of both upper eyelids 10/25/2019   Laryngopharyngeal reflux (LPR) 09/22/2017   Hoarseness 07/22/2017   Neck mass 02/08/2017   Supraclavicular fossa fullness 12/18/2016   Hematuria 08/19/2015   Routine general medical examination at a health care facility 12/05/2014   Groin pain, right 04/05/2012   Lower abdominal pain 11/11/2011   HYPERLIPIDEMIA 04/10/2007   GERD (gastroesophageal reflux disease) 04/10/2007    Past Surgical History:  Procedure Laterality Date   CARPAL TUNNEL RELEASE Bilateral    CHOLECYSTECTOMY     COLONOSCOPY  2011   Dr Madilyn Fireman   fractured mandible  1997   repair   GANGLION CYST EXCISION     LAPAROSCOPIC LYSIS OF ADHESIONS N/A 09/13/2012   Procedure: LAPAROSCOPIC LYSIS OF ADHESIONS;  Surgeon: [name], MD;  Location: [facility];  Service: Gynecology;  Laterality: N/A;   LAPAROSCOPY N/A 09/13/2012    Procedure: LAPAROSCOPY OPERATIVE;  Surgeon: [name], MD;  Location: [facility];  Service: Gynecology;  Laterality: N/A;   OVARIAN CYST REMOVAL     Right   ROTATOR CUFF REPAIR     twice on left and once on right   SALPINGOOPHORECTOMY Bilateral 09/13/2012   Procedure: SALPINGO OOPHORECTOMY;  Surgeon: [name], MD;  Location: [facility];  Service: Gynecology;  Laterality: Bilateral;   TUBAL LIGATION     VAGINAL HYSTERECTOMY      OB History     Gravida  2   Para  2   Term  2   Preterm      AB      Living  2      SAB      IAB      Ectopic      Multiple      Live Births               Home Medications    Prior to Admission medications   Medication Sig Start Date End Date Taking? Authorizing Provider  aspirin EC 81 MG tablet Take 81 mg by mouth daily. Pt takes every other day.   Yes [provider]  atorvastatin (LIPITOR) 40 MG tablet Take 1 tablet (40 mg total) by mouth daily. 04/30/22  Yes Myrlene Broker, MD  calcium gluconate 500 MG tablet Take 1 tablet  by mouth.   Yes [provider]  Calcium-Ergocalciferol (PARVA-CAL) 500-5 MG-MCG TABS Take by mouth. 12/17/19  Yes [provider]  Cyanocobalamin (VITAMIN B-12 PO) Take 1 tablet by mouth every other day.   Yes [provider]  erythromycin ophthalmic ointment Apply to eye. 12/24/19  Yes [provider]  Multiple Vitamin (MULTI-VITAMIN) tablet Take by mouth every other day.   Yes [provider]  pantoprazole (PROTONIX) 40 MG tablet Take 1 tablet (40 mg total) by mouth daily. 04/30/22  Yes Myrlene Broker, MD  penicillin v potassium (VEETID) 500 MG tablet Take 1 tablet (500 mg total) by mouth in the morning and at bedtime for 10 days. 11/21/22 12/01/22 Yes Vergia Chea L, PA  Potassium (POTASSIMIN PO) Take 1 tablet by mouth daily.   Yes [provider]  VITAMIN D, CHOLECALCIFEROL, PO Take 1 tablet by mouth every other day.   Yes  [provider]    Family History Family History  Problem Relation Age of Onset   Heart disease Mother 42       Sudden death.    Bladder Cancer Father 90   Lymphoma Paternal Aunt    Hypertension Sister    Ovarian cancer Sister 40   Leukemia Paternal Grandfather    CAD Neg Hx    Colon cancer Neg Hx    Breast cancer Neg Hx     Social History Social History   Tobacco Use   Smoking status: Never   Smokeless tobacco: Never  Vaping Use   Vaping Use: Never used  Substance Use Topics   Alcohol use: Yes    Comment: 1 a week   Drug use: Never     Allergies   Cephalosporins, Hydrocodone bit-homatrop mbr, and Naproxen sodium   Review of Systems Review of Systems As per HPI  Physical Exam Triage Vital Signs ED Triage Vitals  Enc Vitals Group     BP 11/21/22 1117 117/78     Pulse Rate 11/21/22 1117 100     Resp 11/21/22 1117 18     Temp 11/21/22 1117 100 F (37.8 C)     Temp Source 11/21/22 1117 Oral     SpO2 11/21/22 1117 94 %     Weight 11/21/22 1113 210 lb (95.3 kg)     Height 11/21/22 1113 5\' 10"  (1.778 m)     Head Circumference --      Peak Flow --      Pain Score 11/21/22 1109 6     Pain Loc --      Pain Edu? --      Excl. in GC? --    No data found.  Updated Vital Signs BP 117/78 (BP Location: Left Arm)   Pulse 100   Temp 100 F (37.8 C) (Oral)   Resp 18   Ht 5\' 10"  (1.778 m)   Wt 210 lb (95.3 kg)   SpO2 95%   BMI 30.13 kg/m   Visual Acuity Right Eye Distance:   Left Eye Distance:   Bilateral Distance:    Right Eye Near:   Left Eye Near:    Bilateral Near:     Physical Exam Vitals and nursing note reviewed.  Constitutional:      General: She is not in acute distress.    Appearance: She is well-developed. She is obese. She is ill-appearing. She is not toxic-appearing or diaphoretic.  HENT:     Head: Normocephalic and atraumatic.     Right Ear:  Tympanic membrane and ear canal normal. No drainage, swelling or tenderness. No  middle ear effusion. Tympanic membrane is not erythematous.     Left Ear: Tympanic membrane and ear canal normal. No drainage, swelling or tenderness.  No middle ear effusion. Tympanic membrane is not erythematous.     Nose: No congestion or rhinorrhea.     Mouth/Throat:     Mouth: Mucous membranes are moist.     Pharynx: Uvula midline. Oropharyngeal exudate and posterior oropharyngeal erythema present. No pharyngeal swelling or uvula swelling.     Tonsils: No tonsillar exudate.  Eyes:     Extraocular Movements:     Right eye: Normal extraocular motion.     Left eye: Normal extraocular motion.     Conjunctiva/sclera: Conjunctivae normal.     Pupils: Pupils are equal, round, and reactive to light.  Cardiovascular:     Rate and Rhythm: Normal rate and regular rhythm.     Heart sounds: Murmur (#2/6 R #2 ICS) heard.  Pulmonary:     Effort: Pulmonary effort is normal. No respiratory distress.     Breath sounds: Normal breath sounds. No stridor. No wheezing, rhonchi or rales.  Chest:     Chest wall: No tenderness.  Abdominal:     Palpations: Abdomen is soft.  Musculoskeletal:     Cervical back: Normal range of motion and neck supple.  Lymphadenopathy:     Cervical: Cervical adenopathy (B submandibular) present.  Skin:    General: Skin is warm and dry.     Coloration: Skin is not pale.     Findings: No erythema or rash.  Neurological:     General: No focal deficit present.     Mental Status: She is alert and oriented to person, place, and time.  Psychiatric:        Mood and Affect: Mood normal.        Behavior: Behavior normal.      UC Treatments / Results  Labs (all labs ordered are listed, but only abnormal results are displayed) Labs Reviewed  POCT RAPID STREP A (OFFICE) - Abnormal; Notable for the following components:      Result Value   Rapid Strep A Screen Positive (*)    All other components within normal limits    EKG   Radiology No results  found.  Procedures Procedures (including critical care time)  Medications Ordered in UC Medications - No data to display  Initial Impression / Assessment and Plan / UC Course  I have reviewed the triage vital signs and the nursing notes.  Pertinent labs & imaging results that were available during my care of the patient were reviewed by me and considered in my medical decision making (see chart for details).     Strep sore throat - start pcn vk. Pt has cephalosporins listed as allergy in her chart.  Patient admits to taking amoxicillin numerous times in the past prior to dental procedures prophylactically due to her known cardiac murmur.  She denies any adverse reactions to penicillins despite her reaction to cephalosporins.   Final Clinical Impressions(s) / UC Diagnoses   Final diagnoses:  Streptococcal sore throat     Discharge Instructions      Your rapid strep was positive for strep throat. Please start taking antibiotics as prescribed. Do not stop taking them until all has been completed. After completing the third day of antibiotics, throw away your current toothbrush and get a new one. This will prevent re-contamination. You may alternate  ibuprofen and tylenol every 4 hours for fever and pain control. Do not share food, beverages, or kiss on the lips until >48 hours after starting antibiotics and >24 hours after fever resolved. This is contagious.     ED Prescriptions     Medication Sig Dispense Auth. Provider   penicillin v potassium (VEETID) 500 MG tablet Take 1 tablet (500 mg total) by mouth in the morning and at bedtime for 10 days. 20 tablet Haruka Kowaleski L, [place]      PDMP not reviewed this encounter.   Maretta Bees, [place] 11/21/22 1717

## 2022-11-21 NOTE — ED Triage Notes (Signed)
Started "with getting thirsty yesterday" & very tired. During night "? Fever, chills". This am "body aches/pain, even my fingers hurt". "Sore throat and feels swollen today". No runny nose. No cough.

## 2022-11-21 NOTE — Discharge Instructions (Addendum)
Your rapid strep was positive for strep throat. Please start taking antibiotics as prescribed. Do not stop taking them until all has been completed. After completing the third day of antibiotics, throw away your current toothbrush and get a new one. This will prevent re-contamination. You may alternate ibuprofen and tylenol every 4 hours for fever and pain control. Do not share food, beverages, or kiss on the lips until >48 hours after starting antibiotics and >24 hours after fever resolved. This is contagious.   

## 2022-12-02 DIAGNOSIS — M25511 Pain in right shoulder: Secondary | ICD-10-CM | POA: Diagnosis not present

## 2022-12-02 DIAGNOSIS — M542 Cervicalgia: Secondary | ICD-10-CM | POA: Diagnosis not present

## 2022-12-06 DIAGNOSIS — M542 Cervicalgia: Secondary | ICD-10-CM | POA: Diagnosis not present

## 2022-12-09 DIAGNOSIS — M542 Cervicalgia: Secondary | ICD-10-CM | POA: Diagnosis not present

## 2022-12-09 DIAGNOSIS — H02055 Trichiasis without entropian left lower eyelid: Secondary | ICD-10-CM | POA: Diagnosis not present

## 2022-12-30 ENCOUNTER — Other Ambulatory Visit: Payer: Self-pay | Admitting: Internal Medicine

## 2023-01-18 DIAGNOSIS — M542 Cervicalgia: Secondary | ICD-10-CM | POA: Diagnosis not present

## 2023-01-25 NOTE — Telephone Encounter (Signed)
error 

## 2023-01-26 ENCOUNTER — Encounter (INDEPENDENT_AMBULATORY_CARE_PROVIDER_SITE_OTHER): Payer: Self-pay

## 2023-02-10 ENCOUNTER — Encounter (INDEPENDENT_AMBULATORY_CARE_PROVIDER_SITE_OTHER): Payer: Self-pay

## 2023-02-21 ENCOUNTER — Encounter: Payer: Medicare Other | Admitting: Internal Medicine

## 2023-03-10 ENCOUNTER — Ambulatory Visit: Payer: Medicare Other

## 2023-03-10 ENCOUNTER — Ambulatory Visit (INDEPENDENT_AMBULATORY_CARE_PROVIDER_SITE_OTHER): Payer: Medicare Other | Admitting: Internal Medicine

## 2023-03-10 ENCOUNTER — Encounter: Payer: Self-pay | Admitting: Internal Medicine

## 2023-03-10 VITALS — BP 118/82 | HR 69 | Temp 98.4°F | Ht 70.0 in | Wt 207.0 lb

## 2023-03-10 DIAGNOSIS — K219 Gastro-esophageal reflux disease without esophagitis: Secondary | ICD-10-CM

## 2023-03-10 DIAGNOSIS — R7301 Impaired fasting glucose: Secondary | ICD-10-CM

## 2023-03-10 DIAGNOSIS — Z0000 Encounter for general adult medical examination without abnormal findings: Secondary | ICD-10-CM | POA: Diagnosis not present

## 2023-03-10 DIAGNOSIS — I700 Atherosclerosis of aorta: Secondary | ICD-10-CM | POA: Diagnosis not present

## 2023-03-10 DIAGNOSIS — E782 Mixed hyperlipidemia: Secondary | ICD-10-CM

## 2023-03-10 LAB — COMPREHENSIVE METABOLIC PANEL
ALT: 19 U/L (ref 0–35)
AST: 22 U/L (ref 0–37)
Albumin: 4 g/dL (ref 3.5–5.2)
Alkaline Phosphatase: 112 U/L (ref 39–117)
BUN: 13 mg/dL (ref 6–23)
CO2: 27 meq/L (ref 19–32)
Calcium: 9.1 mg/dL (ref 8.4–10.5)
Chloride: 103 meq/L (ref 96–112)
Creatinine, Ser: 0.74 mg/dL (ref 0.40–1.20)
GFR: 85.57 mL/min (ref >60.00)
Glucose, Bld: 86 mg/dL (ref 70–99)
Potassium: 3.8 meq/L (ref 3.5–5.1)
Sodium: 139 meq/L (ref 135–145)
Total Bilirubin: 0.7 mg/dL (ref 0.2–1.2)
Total Protein: 7 g/dL (ref 6.0–8.3)

## 2023-03-10 LAB — CBC
HCT: 39.9 % (ref 36.0–46.0)
Hemoglobin: 13.1 g/dL (ref 12.0–15.0)
MCHC: 32.8 g/dL (ref 30.0–36.0)
MCV: 89.9 fl (ref 78.0–100.0)
Platelets: 227 10*3/uL (ref 150.0–400.0)
RBC: 4.43 Mil/uL (ref 3.87–5.11)
RDW: 13.7 % (ref 11.5–15.5)
WBC: 5.5 10*3/uL (ref 4.0–10.5)

## 2023-03-10 LAB — LIPID PANEL
Cholesterol: 149 mg/dL (ref 0–200)
HDL: 43.3 mg/dL (ref >39.00)
LDL Cholesterol: 71 mg/dL (ref 0–99)
NonHDL: 105.58
Total CHOL/HDL Ratio: 3
Triglycerides: 174 mg/dL — ABNORMAL HIGH (ref 0.0–149.0)
VLDL: 34.8 mg/dL (ref 0.0–40.0)

## 2023-03-10 LAB — HEMOGLOBIN A1C: Hgb A1c MFr Bld: 6 % (ref 4.6–6.5)

## 2023-03-10 MED ORDER — ATORVASTATIN CALCIUM 40 MG PO TABS: 40.0000 mg | ORAL_TABLET | Freq: Every day | ORAL | 3 refills | Status: AC

## 2023-03-10 MED ORDER — PANTOPRAZOLE SODIUM 40 MG PO TBEC: 40.0000 mg | DELAYED_RELEASE_TABLET | Freq: Every day | ORAL | 3 refills | Status: AC

## 2023-03-10 NOTE — Assessment & Plan Note (Signed)
Checking lipid panel and adjust atorvastatin 40 mg daily as needed.  

## 2023-03-10 NOTE — Assessment & Plan Note (Signed)
Taking lipitor and will continue.

## 2023-03-10 NOTE — Assessment & Plan Note (Signed)
Taking protonix 40 mg daily when she remembers refilled.

## 2023-03-10 NOTE — Progress Notes (Signed)
   Subjective:   Patient ID: Dominique Lane, female    DOB: 1961-05-18, 62 y.o.   MRN: [id number]  HPI The patient is here for physical.  PMH, [facility], social history reviewed and updated  Review of Systems  Constitutional: Negative.   HENT: Negative.    Eyes: Negative.   Respiratory:  Negative for cough, chest tightness and shortness of breath.   Cardiovascular:  Negative for chest pain, palpitations and leg swelling.  Gastrointestinal:  Negative for abdominal distention, abdominal pain, constipation, diarrhea, nausea and vomiting.  Musculoskeletal: Negative.   Skin: Negative.   Neurological: Negative.   Psychiatric/Behavioral: Negative.      Objective:  Physical Exam Constitutional:      Appearance: She is well-developed.  HENT:     Head: Normocephalic and atraumatic.  Cardiovascular:     Rate and Rhythm: Normal rate and regular rhythm.  Pulmonary:     Effort: Pulmonary effort is normal. No respiratory distress.     Breath sounds: Normal breath sounds. No wheezing or rales.  Abdominal:     General: Bowel sounds are normal. There is no distension.     Palpations: Abdomen is soft.     Tenderness: There is no abdominal tenderness. There is no rebound.  Musculoskeletal:     Cervical back: Normal range of motion.  Skin:    General: Skin is warm and dry.  Neurological:     Mental Status: She is alert and oriented to person, place, and time.     Coordination: Coordination normal.     Vitals:   03/10/23 1029  BP: 118/82  Pulse: 69  Temp: 98.4 F (36.9 C)  TempSrc: Oral  SpO2: 96%  Weight: 207 lb (93.9 kg)  Height: 5\' 10"  (1.778 m)    Assessment & Plan:

## 2023-03-10 NOTE — Assessment & Plan Note (Signed)
Flu shot yearly. Shingrix due at pharmacy. Tetanus up to date. Colonoscopy up to date. Mammogram up to date, pap smear up to date and dexa up to date. Counseled about sun safety and mole surveillance. Counseled about the dangers of distracted driving. Given 10 year screening recommendations.

## 2023-03-10 NOTE — Assessment & Plan Note (Signed)
Checking HgA1c and adjust as needed.  

## 2023-03-11 ENCOUNTER — Ambulatory Visit
Admission: RE | Admit: 2023-03-11 | Discharge: 2023-03-11 | Disposition: A | Discharge status: Home / Self Care | Payer: Medicare Other | Source: Ambulatory Visit | Attending: Obstetrics & Gynecology | Admitting: Obstetrics & Gynecology

## 2023-03-11 DIAGNOSIS — Z1231 Encounter for screening mammogram for malignant neoplasm of breast: Secondary | ICD-10-CM

## 2023-03-15 ENCOUNTER — Encounter: Payer: Self-pay | Admitting: Radiology

## 2023-03-15 ENCOUNTER — Ambulatory Visit (INDEPENDENT_AMBULATORY_CARE_PROVIDER_SITE_OTHER): Payer: Medicare Other | Admitting: Radiology

## 2023-03-15 VITALS — BP 104/68 | Ht 68.0 in | Wt 206.0 lb

## 2023-03-15 DIAGNOSIS — N907 Vulvar cyst: Secondary | ICD-10-CM | POA: Diagnosis not present

## 2023-03-15 DIAGNOSIS — Z9189 Other specified personal risk factors, not elsewhere classified: Secondary | ICD-10-CM | POA: Diagnosis not present

## 2023-03-15 DIAGNOSIS — M8580 Other specified disorders of bone density and structure, unspecified site: Secondary | ICD-10-CM

## 2023-03-15 DIAGNOSIS — Z01419 Encounter for gynecological examination (general) (routine) without abnormal findings: Secondary | ICD-10-CM

## 2023-03-15 NOTE — Progress Notes (Signed)
Dominique Lane [birth year] [id number]   History:  62 y.o. G2P2 presents for annual exam. Father recently passed away who she had been caring for. He was 99. She plans to start going to the gym more frequently now that she has time. C/o small bump on her right lower labia she noticed when washing. No pain, redness or discharge.  Gynecologic History Hysterectomy  Health Maintenance Last Pap: 8/22. Results were: normal Last mammogram: 03/11/23. Results were: normal Last colonoscopy: 2023.  Last Dexa: 2022 osteopenia  Past medical history, past surgical history, family history and social history were all reviewed and documented in the EPIC chart.  ROS:  A ROS was performed and pertinent positives and negatives are included.  Exam:  Vitals:   03/15/23 1336  BP: 104/68  Weight: 206 lb (93.4 kg)  Height: 5\' 8"  (1.727 m)   Body mass index is 31.32 kg/m.  General appearance:  Normal Thyroid:  Symmetrical, normal in size, without palpable masses or nodularity. Respiratory  Auscultation:  Clear without wheezing or rhonchi Cardiovascular  Auscultation:  Regular rate, without rubs, murmurs or gallops  Edema/varicosities:  Not grossly evident Abdominal  Soft,nontender, without masses, guarding or rebound.  Liver/spleen:  No organomegaly noted  Hernia:  None appreciated  Skin  Inspection:  Grossly normal Breasts: Examined lying and sitting.   Right: Without masses, retractions, nipple discharge or axillary adenopathy.   Left: Without masses, retractions, nipple discharge or axillary adenopathy. Genitourinary   Inguinal/mons:  Normal without inguinal adenopathy  External genitalia:  Normal appearing vulva with no masses, tenderness, or lesions. 2mm inclusion cyst right lower labia  BUS/Urethra/Skene's glands:  Normal  Vagina:  Normal appearing with normal color and discharge, no lesions. Atrophy moderate  Cervix:  absent  Uterus:  absent  Adnexa/parametria: absent  Anus and  perineum: Normal   Raynelle Fanning, CMA present for exam  Assessment/Plan:   1. Well woman exam with routine gynecological exam Pap due 2027 No treatment needed for inclusion cyst  2. Osteopenia, unspecified location - DG Bone Density; Future    Discussed SBE, colonoscopy and DEXA screening as appropriate. Encouraged 116mins/week of cardiovascular and weight bearing exercise minimum. Recommend the use of seatbelts and sunscreen consistently.   Return in 1 year for annual or sooner prn.  Arlie Solomons B WHNP-BC 2:05 PM 03/15/2023

## 2023-04-05 DIAGNOSIS — M8580 Other specified disorders of bone density and structure, unspecified site: Secondary | ICD-10-CM

## 2023-04-05 NOTE — Telephone Encounter (Signed)
Routing to [employer]

## 2023-04-25 ENCOUNTER — Other Ambulatory Visit: Payer: Self-pay | Admitting: Medical Genetics

## 2023-04-25 DIAGNOSIS — Z006 Encounter for examination for normal comparison and control in clinical research program: Secondary | ICD-10-CM

## 2023-05-03 ENCOUNTER — Telehealth: Payer: Self-pay

## 2023-05-03 DIAGNOSIS — Z13820 Encounter for screening for osteoporosis: Secondary | ICD-10-CM

## 2023-05-03 DIAGNOSIS — M8580 Other specified disorders of bone density and structure, unspecified site: Secondary | ICD-10-CM

## 2023-05-03 NOTE — Telephone Encounter (Signed)
Pt LVM in triage line stating that her DEXA was cancelled and she was advised by appt desk to schedule w/ [facility]. Pt reports in msg that [facility] location is too inconvenient for her-she lives on the [place] side [place] town closer to [facility], desires alternate location.

## 2023-05-04 NOTE — Telephone Encounter (Signed)
Pt offered [facility]. Pt desired to know if there was a location on [address]? Pt advised on [facility]. However, advised that they are booked out until mid 2025. Pt would still like to go there. Order location changed to [facility] location.   Routing to provider for review. Will leave in triage box for f/u on appt date.

## 2023-05-09 NOTE — Telephone Encounter (Signed)
Pt scheduled for DEXA in 11/2023 @ [place] [facility]. Routing to provider for final review and closing encounter.

## 2023-05-17 ENCOUNTER — Other Ambulatory Visit (HOSPITAL_COMMUNITY)
Admission: RE | Admit: 2023-05-17 | Discharge: 2023-05-17 | Disposition: A | Discharge status: Home / Self Care | Payer: Self-pay | Source: Ambulatory Visit | Attending: Oncology | Admitting: Oncology

## 2023-05-17 DIAGNOSIS — Z006 Encounter for examination for normal comparison and control in clinical research program: Secondary | ICD-10-CM

## 2023-05-31 LAB — GENECONNECT MOLECULAR SCREEN: Genetic Analysis Overall Interpretation: NEGATIVE

## 2023-12-20 ENCOUNTER — Other Ambulatory Visit: Payer: Medicare Other

## 2024-01-11 ENCOUNTER — Other Ambulatory Visit (HOSPITAL_BASED_OUTPATIENT_CLINIC_OR_DEPARTMENT_OTHER): Payer: Self-pay | Admitting: Radiology

## 2024-01-11 DIAGNOSIS — Z1231 Encounter for screening mammogram for malignant neoplasm of breast: Secondary | ICD-10-CM

## 2024-01-31 DIAGNOSIS — M25531 Pain in right wrist: Secondary | ICD-10-CM | POA: Diagnosis not present

## 2024-02-07 DIAGNOSIS — M25531 Pain in right wrist: Secondary | ICD-10-CM | POA: Diagnosis not present

## 2024-03-02 DIAGNOSIS — M25531 Pain in right wrist: Secondary | ICD-10-CM | POA: Diagnosis not present

## 2024-03-08 ENCOUNTER — Ambulatory Visit (HOSPITAL_BASED_OUTPATIENT_CLINIC_OR_DEPARTMENT_OTHER)
Admission: RE | Admit: 2024-03-08 | Discharge: 2024-03-08 | Disposition: A | Discharge status: Home / Self Care | Source: Ambulatory Visit | Attending: Radiology | Admitting: Radiology

## 2024-03-08 DIAGNOSIS — M8589 Other specified disorders of bone density and structure, multiple sites: Secondary | ICD-10-CM | POA: Diagnosis not present

## 2024-03-08 DIAGNOSIS — M8580 Other specified disorders of bone density and structure, unspecified site: Secondary | ICD-10-CM | POA: Insufficient documentation

## 2024-03-08 DIAGNOSIS — Z13820 Encounter for screening for osteoporosis: Secondary | ICD-10-CM | POA: Insufficient documentation

## 2024-03-08 DIAGNOSIS — Z780 Asymptomatic menopausal state: Secondary | ICD-10-CM | POA: Diagnosis not present

## 2024-03-09 ENCOUNTER — Ambulatory Visit: Payer: Self-pay | Admitting: Radiology

## 2024-03-12 ENCOUNTER — Ambulatory Visit
Admission: RE | Admit: 2024-03-12 | Discharge: 2024-03-12 | Disposition: A | Discharge status: Home / Self Care | Source: Ambulatory Visit | Attending: Radiology | Admitting: Radiology

## 2024-03-12 DIAGNOSIS — Z1231 Encounter for screening mammogram for malignant neoplasm of breast: Secondary | ICD-10-CM | POA: Diagnosis not present

## 2024-03-20 DIAGNOSIS — H903 Sensorineural hearing loss, bilateral: Secondary | ICD-10-CM | POA: Diagnosis not present

## 2024-03-24 ENCOUNTER — Other Ambulatory Visit: Payer: Self-pay | Admitting: Internal Medicine

## 2024-04-19 DIAGNOSIS — L812 Freckles: Secondary | ICD-10-CM | POA: Diagnosis not present

## 2024-04-19 DIAGNOSIS — L821 Other seborrheic keratosis: Secondary | ICD-10-CM | POA: Diagnosis not present

## 2024-04-19 DIAGNOSIS — L918 Other hypertrophic disorders of the skin: Secondary | ICD-10-CM | POA: Diagnosis not present

## 2024-04-19 DIAGNOSIS — D2371 Other benign neoplasm of skin of right lower limb, including hip: Secondary | ICD-10-CM | POA: Diagnosis not present

## 2024-04-19 DIAGNOSIS — D225 Melanocytic nevi of trunk: Secondary | ICD-10-CM | POA: Diagnosis not present

## 2024-04-19 DIAGNOSIS — D1801 Hemangioma of skin and subcutaneous tissue: Secondary | ICD-10-CM | POA: Diagnosis not present

## 2024-04-19 DIAGNOSIS — D2271 Melanocytic nevi of right lower limb, including hip: Secondary | ICD-10-CM | POA: Diagnosis not present

## 2024-05-15 ENCOUNTER — Encounter: Payer: Self-pay | Admitting: Internal Medicine

## 2024-05-15 ENCOUNTER — Ambulatory Visit: Admitting: Internal Medicine

## 2024-05-15 VITALS — BP 120/70 | HR 66 | Temp 98.0°F | Ht 68.0 in | Wt 208.2 lb

## 2024-05-15 DIAGNOSIS — R7301 Impaired fasting glucose: Secondary | ICD-10-CM | POA: Diagnosis not present

## 2024-05-15 DIAGNOSIS — K219 Gastro-esophageal reflux disease without esophagitis: Secondary | ICD-10-CM | POA: Diagnosis not present

## 2024-05-15 DIAGNOSIS — Z0000 Encounter for general adult medical examination without abnormal findings: Secondary | ICD-10-CM

## 2024-05-15 DIAGNOSIS — E782 Mixed hyperlipidemia: Secondary | ICD-10-CM

## 2024-05-15 LAB — CBC
HCT: 38.9 % (ref 36.0–46.0)
Hemoglobin: 12.9 g/dL (ref 12.0–15.0)
MCHC: 33.2 g/dL (ref 30.0–36.0)
MCV: 90.8 fl (ref 78.0–100.0)
Platelets: 218 K/uL (ref 150.0–400.0)
RBC: 4.28 Mil/uL (ref 3.87–5.11)
RDW: 12.7 % (ref 11.5–15.5)
WBC: 5.2 K/uL (ref 4.0–10.5)

## 2024-05-15 LAB — HEMOGLOBIN A1C: Hgb A1c MFr Bld: 6 % (ref 4.6–6.5)

## 2024-05-15 LAB — COMPREHENSIVE METABOLIC PANEL WITH GFR
ALT: 15 U/L (ref 0–35)
AST: 18 U/L (ref 0–37)
Albumin: 4.3 g/dL (ref 3.5–5.2)
Alkaline Phosphatase: 103 U/L (ref 39–117)
BUN: 13 mg/dL (ref 6–23)
CO2: 28 meq/L (ref 19–32)
Calcium: 9 mg/dL (ref 8.4–10.5)
Chloride: 105 meq/L (ref 96–112)
Creatinine, Ser: 0.76 mg/dL (ref 0.40–1.20)
GFR: 82.19 mL/min (ref >60.00)
Glucose, Bld: 90 mg/dL (ref 70–99)
Potassium: 4.2 meq/L (ref 3.5–5.1)
Sodium: 139 meq/L (ref 135–145)
Total Bilirubin: 0.7 mg/dL (ref 0.2–1.2)
Total Protein: 6.9 g/dL (ref 6.0–8.3)

## 2024-05-15 LAB — LIPID PANEL
Cholesterol: 127 mg/dL (ref 0–200)
HDL: 41.2 mg/dL (ref >39.00)
LDL Cholesterol: 64 mg/dL (ref 0–99)
NonHDL: 85.82
Total CHOL/HDL Ratio: 3
Triglycerides: 109 mg/dL (ref 0.0–149.0)
VLDL: 21.8 mg/dL (ref 0.0–40.0)

## 2024-05-15 NOTE — Progress Notes (Signed)
   Subjective:   Patient ID: Dominique Lane, female    DOB: 1961/01/28, 63 y.o.   MRN: [id number]  The patient is here for physical. Pertinent topics discussed: Discussed the use of AI scribe software for clinical note transcription with the patient, who gave verbal consent to proceed.  History of Present Illness Dominique Lane is a 63 year old female who presents for a routine follow-up visit.  She has ongoing issues with her shoulder, having undergone two previous rotator cuff surgeries. She feels the last surgery did not resolve her symptoms, as she experiences shoulder locking, particularly at night, and has adapted by modifying her activities to avoid using the affected shoulder.  She mentions occasional back soreness, which she attributes to her activities and lifting. No new joint issues, headaches, or sinus problems.  She describes a period of increased stress and anxiety a couple of weeks ago, attributed to family dynamics and managing her late father's estate. Her father was a chartered loss adjuster, and dealing with his belongings has been challenging.  In terms of her social history, she has been traveling more since her father's passing, with upcoming trips planned, including cruises and a visit to [facility]. She acknowledges a need to resume exercise to manage weight gain and maintain muscle strength.  PMH, [facility], social history reviewed and updated  Review of Systems  Constitutional: Negative.   HENT: Negative.    Eyes: Negative.   Respiratory:  Negative for cough, chest tightness and shortness of breath.   Cardiovascular:  Negative for chest pain, palpitations and leg swelling.  Gastrointestinal:  Negative for abdominal distention, abdominal pain, constipation, diarrhea, nausea and vomiting.  Musculoskeletal: Negative.   Skin: Negative.   Neurological: Negative.   Psychiatric/Behavioral: Negative.      Objective:  Physical Exam Constitutional:      Appearance: She is  well-developed.  HENT:     Head: Normocephalic and atraumatic.  Cardiovascular:     Rate and Rhythm: Normal rate and regular rhythm.  Pulmonary:     Effort: Pulmonary effort is normal. No respiratory distress.     Breath sounds: Normal breath sounds. No wheezing or rales.  Abdominal:     General: Bowel sounds are normal. There is no distension.     Palpations: Abdomen is soft.     Tenderness: There is no abdominal tenderness.  Musculoskeletal:     Cervical back: Normal range of motion.  Skin:    General: Skin is warm and dry.  Neurological:     Mental Status: She is alert and oriented to person, place, and time.     Coordination: Coordination normal.     Vitals:   05/15/24 1103  BP: 120/70  Pulse: 66  Temp: 98 F (36.7 C)  TempSrc: Oral  SpO2: 97%  Weight: 208 lb 3.2 oz (94.4 kg)  Height: 5' 8 (1.727 m)    Assessment & Plan:

## 2024-05-16 ENCOUNTER — Ambulatory Visit: Payer: Self-pay | Admitting: Internal Medicine

## 2024-05-18 NOTE — Assessment & Plan Note (Signed)
 Controlled continue protonix .

## 2024-05-18 NOTE — Assessment & Plan Note (Signed)
 Flu shot up to date. Pneumonia counseled. Shingrix counseled. Tetanus up to date. Colonoscopy up to date. Mammogram up to date, pap smear aged out and dexa up to date. Counseled about sun safety and mole surveillance. Counseled about the dangers of distracted driving. Given 10 year screening recommendations.

## 2024-05-18 NOTE — Assessment & Plan Note (Signed)
 Checking HgA1c and adjust as needed.

## 2024-05-18 NOTE — Assessment & Plan Note (Signed)
Checking lipid panel and adjust lipitor as needed.
